# Patient Record
Sex: Male | Born: 1950
Health system: Southern US, Community
[De-identification: ages and names within clinical notes are randomized; demographics above are authoritative.]

## PROBLEM LIST (undated history)

## (undated) DIAGNOSIS — G4700 Insomnia, unspecified: Secondary | ICD-10-CM

## (undated) DIAGNOSIS — F419 Anxiety disorder, unspecified: Secondary | ICD-10-CM

## (undated) DIAGNOSIS — R945 Abnormal results of liver function studies: Secondary | ICD-10-CM

## (undated) DIAGNOSIS — R7989 Other specified abnormal findings of blood chemistry: Secondary | ICD-10-CM

## (undated) DIAGNOSIS — IMO0002 Reserved for concepts with insufficient information to code with codable children: Secondary | ICD-10-CM

## (undated) DIAGNOSIS — K219 Gastro-esophageal reflux disease without esophagitis: Secondary | ICD-10-CM

## (undated) DIAGNOSIS — I10 Essential (primary) hypertension: Secondary | ICD-10-CM

## (undated) DIAGNOSIS — N529 Male erectile dysfunction, unspecified: Secondary | ICD-10-CM

## (undated) DIAGNOSIS — K635 Polyp of colon: Secondary | ICD-10-CM

## (undated) DIAGNOSIS — E785 Hyperlipidemia, unspecified: Secondary | ICD-10-CM

## (undated) DIAGNOSIS — F17200 Nicotine dependence, unspecified, uncomplicated: Secondary | ICD-10-CM

## (undated) HISTORY — DX: Hyperlipidemia, unspecified: E78.5

## (undated) HISTORY — DX: Male erectile dysfunction, unspecified: N52.9

## (undated) HISTORY — DX: Anxiety disorder, unspecified: F41.9

## (undated) HISTORY — PX: APPENDECTOMY: SHX54

## (undated) HISTORY — DX: Polyp of colon: K63.5

## (undated) HISTORY — DX: Gastro-esophageal reflux disease without esophagitis: K21.9

## (undated) HISTORY — PX: ADENOIDECTOMY: SUR15

## (undated) HISTORY — DX: Nicotine dependence, unspecified, uncomplicated: F17.200

## (undated) HISTORY — PX: DENTAL SURGERY: SHX609

## (undated) HISTORY — DX: Essential (primary) hypertension: I10

## (undated) HISTORY — DX: Other specified abnormal findings of blood chemistry: R79.89

## (undated) HISTORY — PX: OTHER SURGICAL HISTORY: SHX169

## (undated) HISTORY — DX: Reserved for concepts with insufficient information to code with codable children: IMO0002

## (undated) HISTORY — DX: Insomnia, unspecified: G47.00

## (undated) HISTORY — DX: Abnormal results of liver function studies: R94.5

## (undated) HISTORY — PX: VASECTOMY: SHX75

---

## 1998-09-24 ENCOUNTER — Encounter: Payer: Self-pay | Admitting: Emergency Medicine

## 1998-09-24 ENCOUNTER — Emergency Department (HOSPITAL_COMMUNITY): Admission: EM | Admit: 1998-09-24 | Discharge: 1998-09-24 | Payer: Self-pay | Admitting: Emergency Medicine

## 2002-06-28 ENCOUNTER — Encounter: Payer: Self-pay | Admitting: Internal Medicine

## 2002-06-28 ENCOUNTER — Encounter: Admission: RE | Admit: 2002-06-28 | Discharge: 2002-06-28 | Payer: Self-pay | Admitting: Internal Medicine

## 2003-08-07 ENCOUNTER — Encounter: Admission: RE | Admit: 2003-08-07 | Discharge: 2003-08-07 | Payer: Self-pay | Admitting: Neurosurgery

## 2003-08-08 ENCOUNTER — Encounter: Admission: RE | Admit: 2003-08-08 | Discharge: 2003-08-08 | Payer: Self-pay | Admitting: Neurosurgery

## 2003-08-14 ENCOUNTER — Encounter: Admission: RE | Admit: 2003-08-14 | Discharge: 2003-11-12 | Payer: Self-pay | Admitting: Internal Medicine

## 2003-12-04 ENCOUNTER — Encounter: Admission: RE | Admit: 2003-12-04 | Discharge: 2003-12-04 | Payer: Self-pay | Admitting: Internal Medicine

## 2007-06-02 ENCOUNTER — Ambulatory Visit: Payer: Self-pay

## 2007-07-12 ENCOUNTER — Ambulatory Visit: Payer: Self-pay | Admitting: Gastroenterology

## 2007-07-23 ENCOUNTER — Encounter: Payer: Self-pay | Admitting: Gastroenterology

## 2007-07-23 ENCOUNTER — Ambulatory Visit: Payer: Self-pay | Admitting: Gastroenterology

## 2007-07-23 LAB — HM COLONOSCOPY

## 2010-05-29 ENCOUNTER — Encounter (INDEPENDENT_AMBULATORY_CARE_PROVIDER_SITE_OTHER): Payer: Self-pay | Admitting: *Deleted

## 2010-08-15 ENCOUNTER — Emergency Department (HOSPITAL_COMMUNITY): Admission: EM | Admit: 2010-08-15 | Discharge: 2010-08-15 | Payer: Self-pay | Admitting: Emergency Medicine

## 2010-08-18 ENCOUNTER — Emergency Department (HOSPITAL_COMMUNITY): Admission: EM | Admit: 2010-08-18 | Discharge: 2010-08-18 | Payer: Self-pay | Admitting: Family Medicine

## 2010-08-22 ENCOUNTER — Emergency Department (HOSPITAL_COMMUNITY): Admission: EM | Admit: 2010-08-22 | Discharge: 2010-08-22 | Payer: Self-pay | Admitting: Family Medicine

## 2010-08-29 ENCOUNTER — Emergency Department (HOSPITAL_COMMUNITY)
Admission: EM | Admit: 2010-08-29 | Discharge: 2010-08-29 | Payer: Self-pay | Source: Home / Self Care | Admitting: Family Medicine

## 2010-09-18 ENCOUNTER — Observation Stay (HOSPITAL_COMMUNITY)
Admission: EM | Admit: 2010-09-18 | Discharge: 2010-09-19 | Payer: Self-pay | Source: Home / Self Care | Attending: Internal Medicine | Admitting: Internal Medicine

## 2010-10-09 ENCOUNTER — Encounter (INDEPENDENT_AMBULATORY_CARE_PROVIDER_SITE_OTHER): Payer: Self-pay | Admitting: *Deleted

## 2010-10-10 ENCOUNTER — Ambulatory Visit: Admit: 2010-10-10 | Payer: Self-pay | Admitting: Cardiology

## 2010-10-16 ENCOUNTER — Encounter: Payer: Self-pay | Admitting: Gastroenterology

## 2010-10-25 NOTE — H&P (Signed)
NAMEROD, MAJERUS               ACCOUNT NO.:  [id number]  MEDICAL RECORD NO.:  [id number]           PATIENT TYPE:  LOCATION:                                 FACILITY:  PHYSICIAN:  Massie Maroon, MD        DATE OF BIRTH:  01/07/1951  DATE OF ADMISSION: DATE OF DISCHARGE:                             HISTORY & PHYSICAL   CHIEF COMPLAINT:  Epigastric pain.  HISTORY OF PRESENT ILLNESS:  The patient is a 60 year old male with a history of hypertension, hyperlipidemia, complains of a sharp epigastric pain, burning like, indigestion.  He apparently took some Tums and omeprazole and this did not seem to take the pain away and so he presented to the emergency room.  The patient notes that the pain started probably about an hour and half to two hours prior to arrival. His last bowel movement was earlier yesterday and was normal.  The patient himself denies any nausea, vomiting, diarrhea, constipation, bright red blood per rectum or black stool.  EKG showed normal sinus rhythm at 43, left axis deviation, normal PR interval, normal QTc, Q- waves in V1, V2.  Chest x-ray was negative for any acute process.  A CT angio was performed of the chest and did not show any evidence of pulmonary embolus.  It did show bibasilar atelectasis, incidental note of direct origin of the left vertebral artery from the aortic arch, no aortic dissection, mild calcific atherosclerotic disease, nonspecific perinephric stranding bilaterally, stones within the gallbladder, the gallbladder is otherwise unremarkable in appearance, diverticulosis without definitive diverticulitis, and fecalization of the distal small bowel raises concern for small-bowel dysmotility.  The patient will be admitted for workup of epigastric discomfort.  PAST MEDICAL HISTORY: 1. Hypertension. 2. Hyperlipidemia. 3. History of abnormal liver function tests with prior ultrasound,     December 04, 2003, which showed multiple gallstones within  the     gallbladder, slight thickening of the gallbladder wall.  PAST SURGICAL HISTORY: 1. Uvulectomy. 2. Appendectomy. 3. Vasectomy.  SOCIAL HISTORY:  The patient is a current smoker, smokes 1-pack per day for 20 years.  He occasionally drinks.  He is married and has 2 children.  He is retired from [employer].  FAMILY HISTORY:  Mother is alive at age 12 and has a history of recurrent falls and thyroid problems.  Father is alive at age 30 and status post pacemaker at age 23.  He has a history of hypertension, hyperlipidemia, prostate cancer.  One sister is alive at age 36 and healthy.  ALLERGIES:  No known drug allergies.  MEDICATIONS: 1. Enteric-coated aspirin 81 mg p.o. daily. 2. Ambien 10 mg p.o. q.h.s. p.r.n. 3. Diovan 160 mg one-half p.o. daily. 4. Lipitor 20 mg p.o. q.h.s. 5. Vita-Zinc. 6. Vitamin D.  REVIEW OF SYSTEMS:  Negative for all 10 organ systems except for pertinent positives stated above.  PHYSICAL EXAMINATION:  VITAL SIGNS:  Temperature 97.4, pulse 62, blood pressure 125/71, pulse ox is 96% on room air. HEENT:  Anicteric, EOMI, no nystagmus, pupils 1.5 mm, symmetric. Direct, consensual, and near reflexes intact.  Mucous membranes moist. NECK:  No JVD, no bruit. HEART:  Regular rate and rhythm.  S1 and S2.  No murmurs, gallops, or rubs. LUNGS:  Clear to auscultation bilaterally. ABDOMEN:  Soft, nontender, nondistended.  Positive bowel sounds. EXTREMITIES:  No cyanosis, clubbing, or edema. SKIN:  No rashes. LYMPH NODES:  No adenopathy. NEURO:  Nonfocal, cranial nerves II through XII intact.  Reflexes 2+, symmetric, diffuse with downgoing toes bilaterally.  Motor strength 5/5 in all 4 extremities, pinprick intact.  LABORATORY DATA:  Lipase 27, AST 29, ALT 24, alk phos 67, total bilirubin 0.7, troponin I less than 0.05.  WBC 9.3, hemoglobin 13.9, platelet count 244.  BUN 22, creatinine 1.1.  CT angio chest, no evidence of aortic dissection or  pulmonary embolism, incidental note of direct origin of the left vertebral artery from the aortic arch, stones noted within the gallbladder, otherwise unremarkable, fecalization of the distal small bowel raises concern for small-bowel dysmotility, diverticulosis along the descending and proximal sigmoid colon without evidence of diverticulitis.  ASSESSMENT/PLAN: 1. Epigastric pain likely secondary to gastroesophageal reflux     disease:  We will, however, place the patient on telemetry as this     could an atypical presentation of heart disease and check CK, CK-     MB, troponin I q.6 h. x3 sets.  The patient does have an abnormal     EKG with Q-waves in V1, V2 and probably needs a stress test either     inpatient or outpatient.  We will try to treat his abdominal pain     with Protonix 40 mg p.o. b.i.d.  The patient will continue aspirin,     blood pressure medications, and will not add a beta-blocker at this     time due to bradycardia.  The patient will be continued on statin. 2. Bradycardia:  The patient had episode of bradycardia down to 44,     but was asymptomatic.  We will check a TSH. 3. Hypertension:  Continue Diovan. 4. Hyperlipidemia: We will use Crestor 40 mg p.o. q.h.s. 5. Deep vein thrombosis prophylaxis:  Lovenox. 6. Tobacco dependence:  The patient was counseled not to smoke for 10     minutes.     Massie Maroon, MD     JYK/MEDQ  D:  09/18/2010  T:  09/18/2010  Job:  [id number]  cc:   Lucky Cowboy, M.D. Fax: [phone]  Electronically Signed by Pearson Grippe MD on 10/25/2010 08:02:21 PM

## 2010-10-29 NOTE — Letter (Signed)
Summary: Colonoscopy Letter  [facility]  [address] [place], [place] [address]   Phone: [phone]  Fax: [phone]      May 29, 2010 MRN: [id number]   [facility] [address] [place], [place]  [address]   Dear Mr. Kratt,   According to your medical record, it is time for you to schedule a Colonoscopy. The American Cancer Society recommends this procedure as a method to detect early colon cancer. Patients with a family history of colon cancer, or a personal history of colon polyps or inflammatory bowel disease are at increased risk.  This letter has beeen generated based on the recommendations made at the time of your procedure. If you feel that in your particular situation this may no longer apply, please contact our office.  Please call our office at [phone] to schedule this appointment or to update your records at your earliest convenience.  Thank you for cooperating with [place] to provide you with the very best care possible.   Sincerely,  Judie Petit T. Russella Dar, M.D.  [facility]astro[facility] [phone]

## 2010-10-30 DIAGNOSIS — E782 Mixed hyperlipidemia: Secondary | ICD-10-CM | POA: Insufficient documentation

## 2010-10-31 ENCOUNTER — Ambulatory Visit (INDEPENDENT_AMBULATORY_CARE_PROVIDER_SITE_OTHER): Payer: BC Managed Care – PPO | Admitting: Cardiology

## 2010-10-31 ENCOUNTER — Encounter: Payer: Self-pay | Admitting: Cardiology

## 2010-10-31 ENCOUNTER — Ambulatory Visit: Admit: 2010-10-31 | Payer: Self-pay | Admitting: Cardiology

## 2010-10-31 DIAGNOSIS — R072 Precordial pain: Secondary | ICD-10-CM

## 2010-10-31 DIAGNOSIS — Z87891 Personal history of nicotine dependence: Secondary | ICD-10-CM | POA: Insufficient documentation

## 2010-10-31 DIAGNOSIS — F17200 Nicotine dependence, unspecified, uncomplicated: Secondary | ICD-10-CM | POA: Insufficient documentation

## 2010-10-31 NOTE — Letter (Signed)
Summary: Appointment - Reschedule  [facility], Main Office  [address]   [place], [place] [address]   Phone: [phone]  Fax: [phone]     October 09, 2010 MRN: [id number]   [facility] [address] [place], [place]  [address]   Dear Mr. Raffel,   Due to a change in our office schedule, your appointment o2-10-2010 on                     at 11:45 a.m.   must be changed.  It is very important that we reach you to reschedule this appointment. We look forward to participating in your health care needs. Please contact us at the number listed above at your earliest convenience to reschedule this appointment.     Sincerely,     Lorne Skeens  [facility] Scheduling Team

## 2010-11-06 NOTE — Letter (Signed)
Summary: Karie Soda MD/[facility]  Karie Soda MD/[facility]   Imported By: Lester Freeburn 11/01/2010 10:23:08  _____________________________________________________________________  External Attachment:    Type:   Image     Comment:   External Document

## 2010-11-06 NOTE — Assessment & Plan Note (Signed)
Summary: np6/chest pain, sob.mb/per rebecca [phone] pt has bcbs.mb/12...   Vital Signs:  Patient profile:   60 year old male Height:      74 inches Weight:      213 pounds BMI:     27.45 Pulse rate:   82 / minute Resp:     16 per minute BP sitting:   120 / 86  (right arm)  Vitals Entered By: Marrion Coy, CNA (October 31, 2010 11:37 AM)  Visit Type:  Initial Consult Primary Provider:  Dr. Marlowe Shores  CC:  chest pain.  History of Present Illness: The patient presents for evaluation of chest discomfort. He has no prior cardiac history but was admitted to the hospital last month with chest pain. He had an extensive workup which included a CT to rule out pulmonary emboli. Lipase was normal. Enzymes and EKG were normal. He subsequently was treated with Prilosec and has had no recurrence of this discomfort. The discomfort was at rest. It was upper epigastric. It was similar to previous reflux. He did not describe associated symptoms or radiation. Since discharge from the hospital he has had no recurrent discomfort. He denies any chest pressure, neck or arm discomfort. He has no palpitations, presyncope or syncope. He is active working on a farm. He has no limitations with this.  Current Medications (verified): 1)  Lipitor 20 Mg Tabs (Atorvastatin Calcium) .Marland Kitchen.. 1 By Mouth Daily 2)  Diovan 160 Mg Tabs (Valsartan) .Marland Kitchen.. 1 By Mouth Daily 3)  Vitamin D3 5000 Unit/ml Liqd (Cholecalciferol) .Marland Kitchen.. 1 By Mouth Daily 4)  Ambien 10 Mg Tabs (Zolpidem Tartrate) .... At Bedtime 5)  Protonix 40 Mg Tbec (Pantoprazole Sodium) .Marland Kitchen.. 1 By Mouth Two Times A Day  Allergies (verified): No Known Drug Allergies  Past History:  Past Medical History: Last updated: 10/30/2010  1. Hypertension.   2. Hyperlipidemia.   3. History of abnormal liver function tests with prior ultrasound,       December 04, 2003, which showed multiple gallstones within the       gallbladder, slight thickening of the gallbladder wall.        Past Surgical History: Reviewed history from 10/30/2010 and no changes required.  1. Uvulectomy.   2. Appendectomy.   3. Vasectomy.   Family History: Mother is alive at age 60 and has a history of  recurrent falls and thyroid problems.  Father is alive at age 57 and  status post pacemaker at age 47.  He has a history of hypertension hyperlipidemia, prostate cancer.  One sister is alive at age 56 and healthy.   Social History: The patient is a current smoker, smokes 1-pack per day for 20 years.  He occasionally drinks.  He is married and has 2  children.  He is retired from [employer].   Review of Systems       As stated in the HPI and negative for all other systems.   Physical Exam  General:  Well developed, well nourished, in no acute distress. Head:  normocephalic and atraumatic Eyes:  PERRLA/EOM intact; conjunctiva and lids normal. Mouth:  Teeth, gums and palate normal. Oral mucosa normal. Neck:  Neck supple, no JVD. No masses, thyromegaly or abnormal cervical nodes. Chest Wall:  no deformities or breast masses noted Lungs:  Clear bilaterally to auscultation and percussion. Abdomen:  Bowel sounds positive; abdomen soft and non-tender without masses, organomegaly, or hernias noted. No hepatosplenomegaly. Msk:  Back normal, normal gait. Muscle strength and tone  normal. Extremities:  No clubbing or cyanosis. Neurologic:  Alert and oriented x 3. Skin:  Intact without lesions or rashes. Cervical Nodes:  no significant adenopathy Axillary Nodes:  no significant adenopathy Inguinal Nodes:  no significant adenopathy Psych:  Normal affect.   Detailed Cardiovascular Exam  Neck    Carotids: Carotids full and equal bilaterally without bruits.      Neck Veins: Normal, no JVD.    Heart    Inspection: no deformities or lifts noted.      Palpation: normal PMI with no thrills palpable.      Auscultation: regular rate and rhythm, S1, S2 without murmurs, rubs, gallops, or  clicks.    Vascular    Abdominal Aorta: no palpable masses, pulsations, or audible bruits.      Femoral Pulses: normal femoral pulses bilaterally.      Pedal Pulses: normal pedal pulses bilaterally.      Radial Pulses: normal radial pulses bilaterally.      Peripheral Circulation: no clubbing, cyanosis, or edema noted with normal capillary refill.     Impression & Recommendations:  Problem # 1:  CHEST PAIN, PRECORDIAL (ICD-786.51) Pretest probability of obstructive coronary disease is low. However, he has significant risk factors. Exercise treadmill testing is indicated to rule out obstructive coronary disease, risk stratify and prescribe exercise. Orders: EKG w/ Interpretation (93000) Treadmill (Treadmill)  Problem # 2:  HYPERLIPIDEMIA (ICD-272.4)  He reports that this is well controlled and followed by his primary provider. His updated medication list for this problem includes:    Lipitor 20 Mg Tabs (Atorvastatin calcium) .Marland Kitchen... 1 by mouth daily  His updated medication list for this problem includes:    Lipitor 20 Mg Tabs (Atorvastatin calcium) .Marland Kitchen... 1 by mouth daily  Problem # 3:  TOBACCO ABUSE (ICD-305.1) We discussed this with greater than 3 minutes. He agrees to try Chantix.  Patient Instructions: 1)  Your physician recommends that you schedule a follow-up appointment at the time of your treadmill 2)  Your physician recommends that you continue on your current medications as directed. Please refer to the Current Medication list given to you today.  Start Chantix as directed 3)  Your physician has requested that you have an exercise tolerance test.  For further information please visit [email or web address]  Please also follow instruction sheet, as given. Prescriptions: CHANTIX 1 MG TABS (VARENICLINE TARTRATE) one twice a day  #60 x 3   Entered by:   Charolotte Capuchin, RN   Authorized by:   Rollene Rotunda, MD, [facility]   Signed by:   Charolotte Capuchin, RN on 10/31/2010    Method used:   Electronically to        [facility] (retail)       [address]       [place], [place]  [address]       Ph: [phone] or [phone]       Fax: [phone]   RxID:   [id number] CHANTIX STARTING MONTH PAK 0.5 MG X 11 & 1 MG X 42 TABS (VARENICLINE TARTRATE) as directed  #1 x 0   Entered by:   Charolotte Capuchin, RN   Authorized by:   Rollene Rotunda, MD, [facility]   Signed by:   Charolotte Capuchin, RN on 10/31/2010   Method used:   Electronically to        [facility] (retail)       [address]       [place], [place]  [address]  Ph: [phone] or [phone]       Fax: [phone]   RxID:   [id number]    Orders Added: 1)  EKG w/ Interpretation [93000] 2)  Treadmill [Treadmill]   EKG, NSR. Leftward axis, otherwise unremarkable.

## 2010-12-02 ENCOUNTER — Encounter: Payer: BC Managed Care – PPO | Admitting: Cardiology

## 2010-12-09 LAB — COMPREHENSIVE METABOLIC PANEL
ALT: 24 U/L (ref 0–53)
AST: 29 U/L (ref 0–37)
Albumin: 3.2 g/dL — ABNORMAL LOW (ref 3.5–5.2)
Alkaline Phosphatase: 58 U/L (ref 39–117)
BUN: 14 mg/dL (ref 6–23)
CO2: 25 mEq/L (ref 19–32)
Calcium: 9.2 mg/dL (ref 8.4–10.5)
Chloride: 106 mEq/L (ref 96–112)
Creatinine, Ser: 0.96 mg/dL (ref 0.4–1.5)
GFR calc Af Amer: >60 mL/min (ref >60)
GFR calc non Af Amer: >60 mL/min (ref >60)
Glucose, Bld: 123 mg/dL — ABNORMAL HIGH (ref 70–99)
Potassium: 4.3 mEq/L (ref 3.5–5.1)
Sodium: 139 mEq/L (ref 135–145)
Total Bilirubin: 0.6 mg/dL (ref 0.3–1.2)
Total Protein: 5.9 g/dL — ABNORMAL LOW (ref 6.0–8.3)

## 2010-12-09 LAB — HEPATIC FUNCTION PANEL
ALT: 24 U/L (ref 0–53)
AST: 29 U/L (ref 0–37)
Albumin: 3.5 g/dL (ref 3.5–5.2)
Alkaline Phosphatase: 67 U/L (ref 39–117)
Bilirubin, Direct: 0.1 mg/dL (ref 0.0–0.3)
Indirect Bilirubin: 0.6 mg/dL (ref 0.3–0.9)
Total Bilirubin: 0.7 mg/dL (ref 0.3–1.2)
Total Protein: 6.5 g/dL (ref 6.0–8.3)

## 2010-12-09 LAB — RAPID URINE DRUG SCREEN, HOSP PERFORMED
Amphetamines: NOT DETECTED
Barbiturates: NOT DETECTED
Benzodiazepines: NOT DETECTED
Cocaine: NOT DETECTED
Opiates: NOT DETECTED
Tetrahydrocannabinol: NOT DETECTED

## 2010-12-09 LAB — POCT CARDIAC MARKERS
CKMB, poc: 1.1 ng/mL (ref 1.0–8.0)
Myoglobin, poc: 117 ng/mL (ref 12–200)
Troponin i, poc: <0.05 ng/mL (ref 0.00–0.09)

## 2010-12-09 LAB — POCT I-STAT, CHEM 8
BUN: 22 mg/dL (ref 6–23)
Calcium, Ion: 1.14 mmol/L (ref 1.12–1.32)
Chloride: 104 mEq/L (ref 96–112)
Creatinine, Ser: 1.1 mg/dL (ref 0.4–1.5)
Glucose, Bld: 152 mg/dL — ABNORMAL HIGH (ref 70–99)
HCT: 40 % (ref 39.0–52.0)
Hemoglobin: 13.6 g/dL (ref 13.0–17.0)
Potassium: 4 mEq/L (ref 3.5–5.1)
Sodium: 140 mEq/L (ref 135–145)
TCO2: 29 mmol/L (ref 0–100)

## 2010-12-09 LAB — CARDIAC PANEL(CRET KIN+CKTOT+MB+TROPI)
CK, MB: 1.6 ng/mL (ref 0.3–4.0)
CK, MB: 1.8 ng/mL (ref 0.3–4.0)
CK, MB: 2 ng/mL (ref 0.3–4.0)
Relative Index: 0.2 (ref 0.0–2.5)
Relative Index: 0.4 (ref 0.0–2.5)
Relative Index: 0.5 (ref 0.0–2.5)
Total CK: 419 U/L — ABNORMAL HIGH (ref 7–232)
Total CK: 472 U/L — ABNORMAL HIGH (ref 7–232)
Total CK: 652 U/L — ABNORMAL HIGH (ref 7–232)
Troponin I: 0.01 ng/mL (ref 0.00–0.06)
Troponin I: 0.01 ng/mL (ref 0.00–0.06)
Troponin I: 0.01 ng/mL (ref 0.00–0.06)

## 2010-12-09 LAB — CBC
HCT: 36.5 % — ABNORMAL LOW (ref 39.0–52.0)
HCT: 40.3 % (ref 39.0–52.0)
Hemoglobin: 12.4 g/dL — ABNORMAL LOW (ref 13.0–17.0)
Hemoglobin: 13.9 g/dL (ref 13.0–17.0)
MCH: 30.2 pg (ref 26.0–34.0)
MCH: 30.5 pg (ref 26.0–34.0)
MCHC: 34 g/dL (ref 30.0–36.0)
MCHC: 34.5 g/dL (ref 30.0–36.0)
MCV: 88.4 fL (ref 78.0–100.0)
MCV: 89 fL (ref 78.0–100.0)
Platelets: 172 10*3/uL (ref 150–400)
Platelets: 244 10*3/uL (ref 150–400)
RBC: 4.1 MIL/uL — ABNORMAL LOW (ref 4.22–5.81)
RBC: 4.56 MIL/uL (ref 4.22–5.81)
RDW: 12.8 % (ref 11.5–15.5)
RDW: 12.9 % (ref 11.5–15.5)
WBC: 7.4 10*3/uL (ref 4.0–10.5)
WBC: 9.3 10*3/uL (ref 4.0–10.5)

## 2010-12-09 LAB — DIFFERENTIAL
Basophils Absolute: 0.1 10*3/uL (ref 0.0–0.1)
Basophils Relative: 1 % (ref 0–1)
Eosinophils Absolute: 0.6 10*3/uL (ref 0.0–0.7)
Eosinophils Relative: 6 % — ABNORMAL HIGH (ref 0–5)
Lymphocytes Relative: 37 % (ref 12–46)
Lymphs Abs: 3.4 10*3/uL (ref 0.7–4.0)
Monocytes Absolute: 0.7 10*3/uL (ref 0.1–1.0)
Monocytes Relative: 8 % (ref 3–12)
Neutro Abs: 4.4 10*3/uL (ref 1.7–7.7)
Neutrophils Relative %: 48 % (ref 43–77)

## 2010-12-09 LAB — TSH: TSH: 0.901 u[IU]/mL (ref 0.350–4.500)

## 2010-12-09 LAB — LIPASE, BLOOD: Lipase: 27 U/L (ref 11–59)

## 2010-12-17 ENCOUNTER — Encounter: Payer: Self-pay | Admitting: Internal Medicine

## 2010-12-17 ENCOUNTER — Encounter: Payer: Self-pay | Admitting: Cardiology

## 2010-12-30 ENCOUNTER — Encounter (INDEPENDENT_AMBULATORY_CARE_PROVIDER_SITE_OTHER): Payer: BC Managed Care – PPO | Admitting: Cardiology

## 2010-12-30 DIAGNOSIS — R072 Precordial pain: Secondary | ICD-10-CM

## 2010-12-30 NOTE — Progress Notes (Signed)
Exercise Treadmill Test  Pre-Exercise Testing Evaluation QT:  .38 QTc: .44   ST Segments:  no significant ST changes at rest     Test  Exercise Tolerance Test Ordering MD: Angelina Sheriff, MD  Interpreting MD:  Angelina Sheriff, MD  Unique Test No:1  Treadmill:  1  Indication for ETT: known ASHD  Contraindication to ETT: No   Stress Modality: exercise - treadmill  Cardiac Imaging Performed: non   Protocol: standard Bruce - maximal  Max BP:  189/84  Max MPHR (bpm):  160 85% MPR (bpm):  136  MPHR obtained (bpm):  141 % MPHR obtained:  89  Reached 85% MPHR (min:sec):  7:00 Total Exercise Time (min-sec):  9:00  Workload in METS:  10.1 Borg Scale: 15  Reason ETT Terminated:  desired heart rate attained    ST Segment Analysis At Rest: normal ST segments - no evidence of significant ST depression With Exercise: no evidence of significant ST depression  Other Information Arrhythmia:  No Angina during ETT:  absent (0) Quality of ETT:  diagnostic  ETT Interpretation:  normal - no evidence of ischemia by ST analysis  Comments: The patient had an good exercise tolerance.  There was no chest pain.  There was an appropriate level of dyspnea.  There were no arrhythmias, a normal heart rate response and normal BP response.  There were no ischemic ST T wave changes and a normal heart rate recovery.  Recommendations: Negative adequate ETT.  No further testing is indicated.  Based on the above I gave the patient a prescription for exercise.

## 2010-12-30 NOTE — Progress Notes (Deleted)
Exercise Treadmill Test  Pre-Exercise Testing Evaluation Rhythm: {CHL RHYTHM BASELINE EKG FOR BMW:41324401}  Rate: {CHL RATE BASELINE EKG FOR ETT:21021048}   PR:  {CHL PR BASELINE EKG FOR ETT:21021049} QRS:  {CHL QRS BASELINE EKG FOR ETT:21021050}  QT:  {CHL QT BASELINE EKG FOR ETT:21021051} QTc: {CHL QTC BASELINE EKG FOR ETT:21021052}   P axis: {CHL AXIS BASELINE EKG FOR ETT:21021053}  QRS axis:  {CHL AXIS BASELINE EKG FOR ETT:21021053}  ST Segments:  {CHL ST SEGMENTS BASELINE EKG FOR UUV:25366440}     Test  Exercise Tolerance Test Ordering MD: {CHL LB CARDIOLOGY MD FOR HKV:42595638}  Interpreting MD:  {CHL LB CARDIOLOGY MD FOR VFI:43329518}  Unique Test No: ***  Treadmill:  {CHL TREADMILL # FOR ACZ:66063016}  Indication for ETT: {CHL INDICATION FOR WFU:93235573}  Contraindication to ETT: {CHL CONTRAINDICATION TO UKG:25427062}   Stress Modality: {CHL STRESS MODALITY FOR BJS:28315176}  Cardiac Imaging Performed: {CHL CARDIAC IMAGING PERFORMED FOR HYW:73710626}   Protocol: {CHL PROTOCOL FOR ETT:21021061}  Max BP:  ***/***  Max MPHR (bpm):  *** 85% MPR (bpm):  ***  MPHR obtained (bpm):  *** % MPHR obtained:  ***  Reached 85% MPHR (min:sec):  *** Total Exercise Time (min-sec):  ***  Workload in METS:  *** Borg Scale: ***  Reason ETT Terminated:  {CHL REASON TERMINATED FOR ETT:21021064}    ST Segment Analysis At Rest: {CHL ST SEGMENT AT REST FOR RSW:54627035} With Exercise: {CHL ST SEGMENT WITH EXERCISE FOR KKX:38182993}  Other Information Arrhythmia:  {CHL ARRHYTHMIA FOR ZJI:96789381} Angina during ETT:  {CHL ANGINA DURING OFB:51025852} Quality of ETT:  {CHL QUALITY OF DPO:24235361}  ETT Interpretation:  {CHL INTERPRETATION FOR WER:15400867}  Comments: ***  Recommendations: ***   This encounter was created in error - please disregard.

## 2011-02-08 ENCOUNTER — Emergency Department (HOSPITAL_COMMUNITY)
Admission: EM | Admit: 2011-02-08 | Discharge: 2011-02-08 | Disposition: A | Discharge status: Home / Self Care | Payer: BC Managed Care – PPO | Attending: Emergency Medicine | Admitting: Emergency Medicine

## 2011-02-08 DIAGNOSIS — E7800 Pure hypercholesterolemia, unspecified: Secondary | ICD-10-CM | POA: Insufficient documentation

## 2011-02-08 DIAGNOSIS — R1013 Epigastric pain: Secondary | ICD-10-CM | POA: Insufficient documentation

## 2011-02-08 DIAGNOSIS — R61 Generalized hyperhidrosis: Secondary | ICD-10-CM | POA: Insufficient documentation

## 2011-02-08 DIAGNOSIS — K219 Gastro-esophageal reflux disease without esophagitis: Secondary | ICD-10-CM | POA: Insufficient documentation

## 2011-02-08 DIAGNOSIS — I10 Essential (primary) hypertension: Secondary | ICD-10-CM | POA: Insufficient documentation

## 2011-02-08 LAB — POCT I-STAT, CHEM 8
BUN: 19 mg/dL (ref 6–23)
Calcium, Ion: 1.1 mmol/L — ABNORMAL LOW (ref 1.12–1.32)
Chloride: 105 mEq/L (ref 96–112)
Creatinine, Ser: 1.4 mg/dL (ref 0.4–1.5)
Glucose, Bld: 218 mg/dL — ABNORMAL HIGH (ref 70–99)
HCT: 43 % (ref 39.0–52.0)
Hemoglobin: 14.6 g/dL (ref 13.0–17.0)
Potassium: 3.1 mEq/L — ABNORMAL LOW (ref 3.5–5.1)
Sodium: 141 mEq/L (ref 135–145)
TCO2: 23 mmol/L (ref 0–100)

## 2011-02-08 LAB — CBC
HCT: 41 % (ref 39.0–52.0)
Hemoglobin: 14.5 g/dL (ref 13.0–17.0)
MCH: 31.2 pg (ref 26.0–34.0)
MCHC: 35.4 g/dL (ref 30.0–36.0)
MCV: 88.2 fL (ref 78.0–100.0)
Platelets: 179 10*3/uL (ref 150–400)
RBC: 4.65 MIL/uL (ref 4.22–5.81)
RDW: 12.9 % (ref 11.5–15.5)
WBC: 6.4 10*3/uL (ref 4.0–10.5)

## 2011-02-08 LAB — LIPASE, BLOOD: Lipase: 19 U/L (ref 11–59)

## 2011-02-08 LAB — POCT CARDIAC MARKERS
CKMB, poc: <1 ng/mL — ABNORMAL LOW (ref 1.0–8.0)
Myoglobin, poc: 94.5 ng/mL (ref 12–200)
Troponin i, poc: <0.05 ng/mL (ref 0.00–0.09)

## 2011-02-08 LAB — DIFFERENTIAL
Basophils Absolute: 0 10*3/uL (ref 0.0–0.1)
Basophils Relative: 1 % (ref 0–1)
Eosinophils Absolute: 0.2 10*3/uL (ref 0.0–0.7)
Eosinophils Relative: 4 % (ref 0–5)
Lymphocytes Relative: 21 % (ref 12–46)
Lymphs Abs: 1.4 10*3/uL (ref 0.7–4.0)
Monocytes Absolute: 0.6 10*3/uL (ref 0.1–1.0)
Monocytes Relative: 9 % (ref 3–12)
Neutro Abs: 4.2 10*3/uL (ref 1.7–7.7)
Neutrophils Relative %: 65 % (ref 43–77)

## 2011-02-26 ENCOUNTER — Encounter: Payer: Self-pay | Admitting: Cardiology

## 2011-11-15 IMAGING — CR DG CHEST 1V PORT
1 series · 1 of 1 positions shown · non-contrast
Comparison: None

CLINICAL DATA: Upper abdominal pain, shortness of breath.

PORTABLE CHEST - 1 VIEW

[AP]
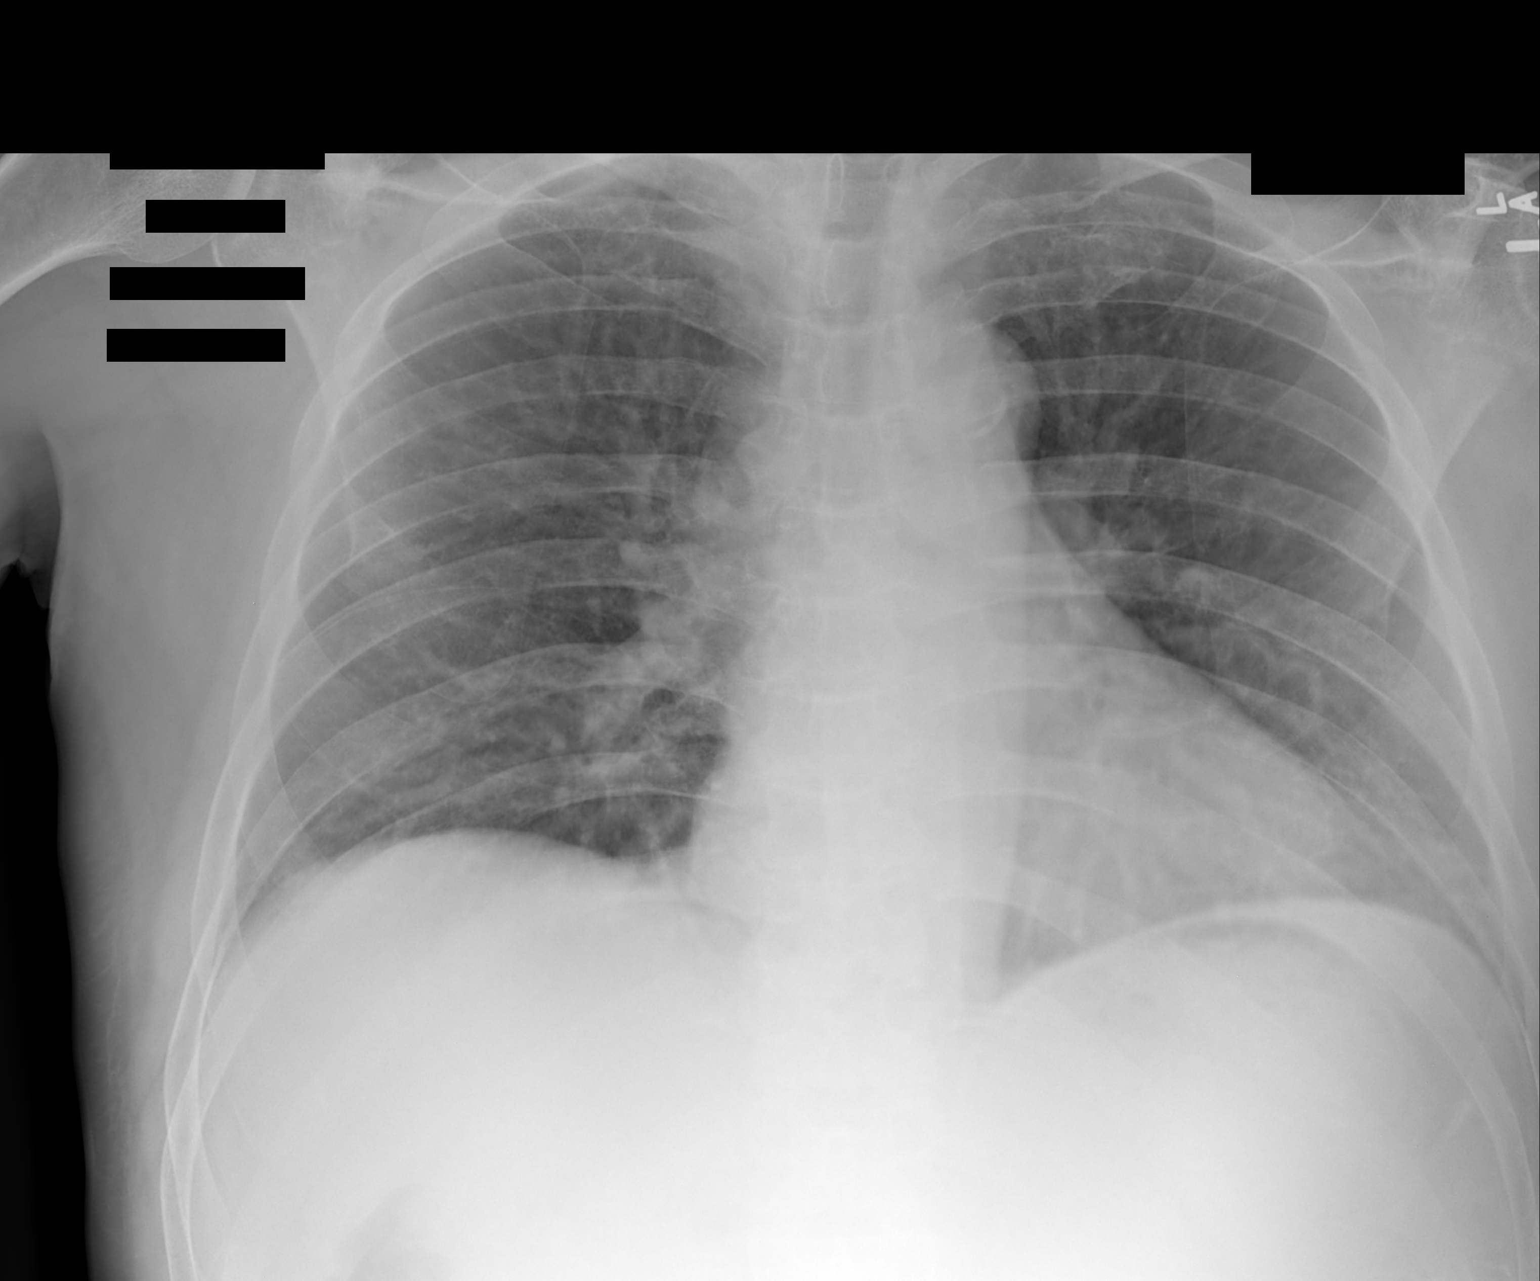

[1 of 1 positions shown; findings below may reference images not displayed]

FINDINGS: There is mild peribronchial thickening and increased
markings.  Heart is normal size.  No confluent opacities or
effusions.  No acute bony abnormality.
IMPRESSION: Mild bronchitic changes.

## 2012-06-17 ENCOUNTER — Encounter: Payer: Self-pay | Admitting: Gastroenterology

## 2012-11-24 ENCOUNTER — Other Ambulatory Visit (HOSPITAL_COMMUNITY): Payer: Self-pay | Admitting: Internal Medicine

## 2012-11-24 ENCOUNTER — Ambulatory Visit (HOSPITAL_COMMUNITY)
Admission: RE | Admit: 2012-11-24 | Discharge: 2012-11-24 | Disposition: A | Discharge status: Home / Self Care | Payer: BC Managed Care – PPO | Source: Ambulatory Visit | Attending: Internal Medicine | Admitting: Internal Medicine

## 2012-11-24 DIAGNOSIS — I2510 Atherosclerotic heart disease of native coronary artery without angina pectoris: Secondary | ICD-10-CM | POA: Insufficient documentation

## 2012-11-24 DIAGNOSIS — M545 Low back pain, unspecified: Secondary | ICD-10-CM

## 2012-11-24 DIAGNOSIS — M542 Cervicalgia: Secondary | ICD-10-CM

## 2012-11-24 DIAGNOSIS — M79609 Pain in unspecified limb: Secondary | ICD-10-CM | POA: Insufficient documentation

## 2012-11-24 DIAGNOSIS — M25519 Pain in unspecified shoulder: Secondary | ICD-10-CM | POA: Insufficient documentation

## 2012-11-25 ENCOUNTER — Other Ambulatory Visit: Payer: Self-pay | Admitting: Internal Medicine

## 2012-11-25 DIAGNOSIS — R9389 Abnormal findings on diagnostic imaging of other specified body structures: Secondary | ICD-10-CM

## 2012-11-29 ENCOUNTER — Ambulatory Visit
Admission: RE | Admit: 2012-11-29 | Discharge: 2012-11-29 | Disposition: A | Discharge status: Home / Self Care | Payer: BC Managed Care – PPO | Source: Ambulatory Visit | Attending: Internal Medicine | Admitting: Internal Medicine

## 2012-11-29 DIAGNOSIS — R9389 Abnormal findings on diagnostic imaging of other specified body structures: Secondary | ICD-10-CM

## 2012-12-01 ENCOUNTER — Other Ambulatory Visit: Payer: Self-pay | Admitting: Dermatology

## 2013-01-07 ENCOUNTER — Other Ambulatory Visit: Payer: Self-pay | Admitting: Orthopedic Surgery

## 2013-01-07 DIAGNOSIS — R200 Anesthesia of skin: Secondary | ICD-10-CM

## 2013-01-07 DIAGNOSIS — M542 Cervicalgia: Secondary | ICD-10-CM

## 2013-01-11 ENCOUNTER — Ambulatory Visit
Admission: RE | Admit: 2013-01-11 | Discharge: 2013-01-11 | Disposition: A | Discharge status: Home / Self Care | Payer: BC Managed Care – PPO | Source: Ambulatory Visit | Attending: Orthopedic Surgery | Admitting: Orthopedic Surgery

## 2013-01-11 DIAGNOSIS — M542 Cervicalgia: Secondary | ICD-10-CM

## 2013-01-11 DIAGNOSIS — R200 Anesthesia of skin: Secondary | ICD-10-CM

## 2013-10-04 ENCOUNTER — Encounter: Payer: Self-pay | Admitting: Internal Medicine

## 2013-10-06 ENCOUNTER — Encounter: Payer: Self-pay | Admitting: Emergency Medicine

## 2013-10-06 ENCOUNTER — Ambulatory Visit (INDEPENDENT_AMBULATORY_CARE_PROVIDER_SITE_OTHER): Payer: BC Managed Care – PPO | Admitting: Emergency Medicine

## 2013-10-06 VITALS — BP 122/80 | HR 68 | Temp 98.2°F | Resp 18 | Ht 73.75 in | Wt 210.0 lb

## 2013-10-06 DIAGNOSIS — R059 Cough, unspecified: Secondary | ICD-10-CM

## 2013-10-06 DIAGNOSIS — I10 Essential (primary) hypertension: Secondary | ICD-10-CM

## 2013-10-06 DIAGNOSIS — Z125 Encounter for screening for malignant neoplasm of prostate: Secondary | ICD-10-CM

## 2013-10-06 DIAGNOSIS — R05 Cough: Secondary | ICD-10-CM

## 2013-10-06 DIAGNOSIS — E782 Mixed hyperlipidemia: Secondary | ICD-10-CM

## 2013-10-06 DIAGNOSIS — Z79899 Other long term (current) drug therapy: Secondary | ICD-10-CM

## 2013-10-06 DIAGNOSIS — E559 Vitamin D deficiency, unspecified: Secondary | ICD-10-CM

## 2013-10-06 DIAGNOSIS — F17200 Nicotine dependence, unspecified, uncomplicated: Secondary | ICD-10-CM

## 2013-10-06 DIAGNOSIS — Z0000 Encounter for general adult medical examination without abnormal findings: Secondary | ICD-10-CM

## 2013-10-06 DIAGNOSIS — R7309 Other abnormal glucose: Secondary | ICD-10-CM

## 2013-10-06 LAB — CBC WITH DIFFERENTIAL/PLATELET
Basophils Absolute: 0.1 10*3/uL (ref 0.0–0.1)
Basophils Relative: 1 % (ref 0–1)
Eosinophils Absolute: 0.3 10*3/uL (ref 0.0–0.7)
Eosinophils Relative: 6 % — ABNORMAL HIGH (ref 0–5)
HCT: 44.6 % (ref 39.0–52.0)
Hemoglobin: 15.9 g/dL (ref 13.0–17.0)
Lymphocytes Relative: 22 % (ref 12–46)
Lymphs Abs: 1.2 10*3/uL (ref 0.7–4.0)
MCH: 31.3 pg (ref 26.0–34.0)
MCHC: 35.7 g/dL (ref 30.0–36.0)
MCV: 87.8 fL (ref 78.0–100.0)
Monocytes Absolute: 0.4 10*3/uL (ref 0.1–1.0)
Monocytes Relative: 7 % (ref 3–12)
Neutro Abs: 3.6 10*3/uL (ref 1.7–7.7)
Neutrophils Relative %: 64 % (ref 43–77)
Platelets: 240 10*3/uL (ref 150–400)
RBC: 5.08 MIL/uL (ref 4.22–5.81)
RDW: 13.1 % (ref 11.5–15.5)
WBC: 5.6 10*3/uL (ref 4.0–10.5)

## 2013-10-06 LAB — LIPID PANEL
Cholesterol: 138 mg/dL (ref 0–200)
HDL: 46 mg/dL (ref >39)
LDL Cholesterol: 78 mg/dL (ref 0–99)
Total CHOL/HDL Ratio: 3 Ratio
Triglycerides: 70 mg/dL (ref <150)
VLDL: 14 mg/dL (ref 0–40)

## 2013-10-06 LAB — PSA: PSA: 2.06 ng/mL (ref <=4.00)

## 2013-10-06 LAB — BASIC METABOLIC PANEL WITH GFR
BUN: 9 mg/dL (ref 6–23)
CO2: 31 mEq/L (ref 19–32)
Calcium: 9.7 mg/dL (ref 8.4–10.5)
Chloride: 94 mEq/L — ABNORMAL LOW (ref 96–112)
Creat: 0.98 mg/dL (ref 0.50–1.35)
GFR, Est African American: >89 mL/min
GFR, Est Non African American: 82 mL/min
Glucose, Bld: 96 mg/dL (ref 70–99)
Potassium: 4.7 mEq/L (ref 3.5–5.3)
Sodium: 132 mEq/L — ABNORMAL LOW (ref 135–145)

## 2013-10-06 LAB — HEMOGLOBIN A1C
Hgb A1c MFr Bld: 5.6 % (ref <5.7)
Mean Plasma Glucose: 114 mg/dL (ref <117)

## 2013-10-06 LAB — HEPATIC FUNCTION PANEL
ALT: 16 U/L (ref 0–53)
AST: 18 U/L (ref 0–37)
Albumin: 4.7 g/dL (ref 3.5–5.2)
Alkaline Phosphatase: 63 U/L (ref 39–117)
Bilirubin, Direct: 0.2 mg/dL (ref 0.0–0.3)
Indirect Bilirubin: 1.1 mg/dL — ABNORMAL HIGH (ref 0.0–0.9)
Total Bilirubin: 1.3 mg/dL — ABNORMAL HIGH (ref 0.3–1.2)
Total Protein: 7.1 g/dL (ref 6.0–8.3)

## 2013-10-06 LAB — MAGNESIUM: Magnesium: 1.6 mg/dL (ref 1.5–2.5)

## 2013-10-06 NOTE — Patient Instructions (Signed)
PREDiabetes Meal Planning Guide The diabetes meal planning guide is a tool to help you plan your meals and snacks. It is important for people with diabetes to manage their blood glucose (sugar) levels. Choosing the right foods and the right amounts throughout your day will help control your blood glucose. Eating right can even help you improve your blood pressure and reach or maintain a healthy weight. CARBOHYDRATE COUNTING MADE EASY When you eat carbohydrates, they turn to sugar. This raises your blood glucose level. Counting carbohydrates can help you control this level so you feel better. When you plan your meals by counting carbohydrates, you can have more flexibility in what you eat and balance your medicine with your food intake. Carbohydrate counting simply means adding up the total amount of carbohydrate grams in your meals and snacks. Try to eat about the same amount at each meal. Foods with carbohydrates are listed below. Each portion below is 1 carbohydrate serving or 15 grams of carbohydrates. Ask your dietician how many grams of carbohydrates you should eat at each meal or snack. Grains and Starches  1 slice bread.   English muffin or hotdog/hamburger bun.   cup cold cereal (unsweetened).   cup cooked pasta or rice.   cup starchy vegetables (corn, potatoes, peas, beans, winter squash).  1 tortilla (6 inches).   bagel.  1 waffle or pancake (size of a CD).   cup cooked cereal.  4 to 6 small crackers. *Whole grain is recommended. Fruit  1 cup fresh unsweetened berries, melon, papaya, pineapple.  1 small fresh fruit.   banana or mango.   cup fruit juice (4 oz unsweetened).   cup canned fruit in natural juice or water.  2 tbs dried fruit.  12 to 15 grapes or cherries. Milk and Yogurt  1 cup fat-free or 1% milk.  1 cup soy milk.  6 oz light yogurt with sugar-free sweetener.  6 oz low-fat soy yogurt.  6 oz plain yogurt. Vegetables  1 cup raw or   cup cooked is counted as 0 carbohydrates or a "free" food.  If you eat 3 or more servings at 1 meal, count them as 1 carbohydrate serving. Other Carbohydrates   oz chips or pretzels.   cup ice cream or frozen yogurt.   cup sherbet or sorbet.  2 inch square cake, no frosting.  1 tbs honey, sugar, jam, jelly, or syrup.  2 small cookies.  3 squares of graham crackers.  3 cups popcorn.  6 crackers.  1 cup broth-based soup.  Count 1 cup casserole or other mixed foods as 2 carbohydrate servings.  Foods with less than 20 calories in a serving may be counted as 0 carbohydrates or a "free" food. You may want to purchase a book or computer software that lists the carbohydrate gram counts of different foods. In addition, the nutrition facts panel on the labels of the foods you eat are a good source of this information. The label will tell you how big the serving size is and the total number of carbohydrate grams you will be eating per serving. Divide this number by 15 to obtain the number of carbohydrate servings in a portion. Remember, 1 carbohydrate serving equals 15 grams of carbohydrate. SERVING SIZES Measuring foods and serving sizes helps you make sure you are getting the right amount of food. The list below tells how big or small some common serving sizes are.  1 oz.........4 stacked dice.  3 oz........Marland KitchenDeck of cards.  1 tsp.......Marland KitchenTip  of little finger.  1 tbs......Marland KitchenMarland KitchenThumb.  2 tbs.......Marland KitchenGolf ball.   cup......Marland KitchenHalf of a fist.  1 cup.......Marland KitchenA fist. SAMPLE DIABETES MEAL PLAN Below is a sample meal plan that includes foods from the grain and starches, dairy, vegetable, fruit, and meat groups. A dietician can individualize a meal plan to fit your calorie needs and tell you the number of servings needed from each food group. However, controlling the total amount of carbohydrates in your meal or snack is more important than making sure you include all of the food groups at  every meal. You may interchange carbohydrate containing foods (dairy, starches, and fruits). The meal plan below is an example of a 2000 calorie diet using carbohydrate counting. This meal plan has 17 carbohydrate servings. Breakfast  1 cup oatmeal (2 carb servings).   cup light yogurt (1 carb serving).  1 cup blueberries (1 carb serving).   cup almonds. Snack  1 large apple (2 carb servings).  1 low-fat string cheese stick. Lunch  Chicken breast salad.  1 cup spinach.   cup chopped tomatoes.  2 oz chicken breast, sliced.  2 tbs low-fat [place] dressing.  12 whole-wheat crackers (2 carb servings).  12 to 15 grapes (1 carb serving).  1 cup low-fat milk (1 carb serving). Snack  1 cup carrots.   cup hummus (1 carb serving). Dinner  3 oz broiled salmon.  1 cup brown rice (3 carb servings). Snack  1  cups steamed broccoli (1 carb serving) drizzled with 1 tsp olive oil and lemon juice.  1 cup light pudding (2 carb servings). DIABETES MEAL PLANNING WORKSHEET Your dietician can use this worksheet to help you decide how many servings of foods and what types of foods are right for you.  BREAKFAST Food Group and Servings / Carb Servings Grain/Starches __________________________________ Dairy __________________________________________ Vegetable ______________________________________ Fruit ___________________________________________ Meat __________________________________________ Fat ____________________________________________ LUNCH Food Group and Servings / Carb Servings Grain/Starches ___________________________________ Dairy ___________________________________________ Fruit ____________________________________________ Meat ___________________________________________ Fat _____________________________________________ Arthur Garcia Food Group and Servings / Carb Servings Grain/Starches ___________________________________ Dairy  ___________________________________________ Fruit ____________________________________________ Meat ___________________________________________ Fat _____________________________________________ SNACKS Food Group and Servings / Carb Servings Grain/Starches ___________________________________ Dairy ___________________________________________ Vegetable _______________________________________ Fruit ____________________________________________ Meat ___________________________________________ Fat _____________________________________________ DAILY TOTALS Starches _________________________ Vegetable ________________________ Fruit ____________________________ Dairy ____________________________ Meat ____________________________ Fat ______________________________ Document Released: 06/12/2005 Document Revised: 12/08/2011 Document Reviewed: 04/23/2009 [employer] Patient Information 2014 [employer] Smoking Cessation, Tips for Success YOU CAN QUIT SMOKING If you are ready to quit smoking, congratulations! You have chosen to help yourself be healthier. Cigarettes bring nicotine, tar, carbon monoxide, and other irritants into your body. Your lungs, heart, and blood vessels will be able to work better without these poisons. There are many different ways to quit smoking. Nicotine gum, nicotine patches, a nicotine inhaler, or nicotine nasal spray can help with physical craving. Hypnosis, support groups, and medicines help break the habit of smoking. Here are some tips to help you quit for good.  Throw away all cigarettes.  Clean and remove all ashtrays from your home, work, and car.  On a card, write down your reasons for quitting. Carry the card with you and read it when you get the urge to smoke.  Cleanse your body of nicotine. Drink enough water and fluids to keep your urine clear or pale yellow. Do this after quitting to flush the nicotine from your body.  Learn to predict your moods. Do not  let a bad situation be your excuse to have a cigarette. Some situations in your  life might tempt you into wanting a cigarette.  Never have "just one" cigarette. It leads to wanting another and another. Remind yourself of your decision to quit.  Change habits associated with smoking. If you smoked while driving or when feeling stressed, try other activities to replace smoking. Stand up when drinking your coffee. Brush your teeth after eating. Sit in a different chair when you read the paper. Avoid alcohol while trying to quit, and try to drink fewer caffeinated beverages. Alcohol and caffeine may urge you to smoke.  Avoid foods and drinks that can trigger a desire to smoke, such as sugary or spicy foods and alcohol.  Ask people who smoke not to smoke around you.  Have something planned to do right after eating or having a cup of coffee. Take a walk or exercise to perk you up. This will help to keep you from overeating.  Try a relaxation exercise to calm you down and decrease your stress. Remember, you may be tense and nervous for the first 2 weeks after you quit, but this will pass.  Find new activities to keep your hands busy. Play with a pen, coin, or rubber band. Doodle or draw things on paper.  Brush your teeth right after eating. This will help cut down on the craving for the taste of tobacco after meals. You can try mouthwash, too.  Use oral substitutes, such as lemon drops, carrots, a cinnamon stick, or chewing gum, in place of cigarettes. Keep them handy so they are available when you have the urge to smoke.  When you have the urge to smoke, try deep breathing.  Designate your home as a nonsmoking area.  If you are a heavy smoker, ask your caregiver about a prescription for nicotine chewing gum. It can ease your withdrawal from nicotine.  Reward yourself. Set aside the cigarette money you save and buy yourself something nice.  Look for support from others. Join a support group or  smoking cessation program. Ask someone at home or at work to help you with your plan to quit smoking.  Always ask yourself, "Do I need this cigarette or is this just a reflex?" Tell yourself, "Today, I choose not to smoke," or "I do not want to smoke." You are reminding yourself of your decision to quit, even if you do smoke a cigarette. HOW WILL I FEEL WHEN I QUIT SMOKING?  The benefits of not smoking start within days of quitting.  You may have symptoms of withdrawal because your body is used to nicotine (the addictive substance in cigarettes). You may crave cigarettes, be irritable, feel very hungry, cough often, get headaches, or have difficulty concentrating.  The withdrawal symptoms are only temporary. They are strongest when you first quit but will go away within 10 to 14 days.  When withdrawal symptoms occur, stay in control. Think about your reasons for quitting. Remind yourself that these are signs that your body is healing and getting used to being without cigarettes.  Remember that withdrawal symptoms are easier to treat than the major diseases that smoking can cause.  Even after the withdrawal is over, expect periodic urges to smoke. However, these cravings are generally short-lived and will go away whether you smoke or not. Do not smoke!  If you relapse and smoke again, do not lose hope. Most smokers quit 3 times before they are successful.  If you relapse, do not give up! Plan ahead and think about what you will do the next time  you get the urge to smoke. LIFE AS A NONSMOKER: MAKE IT FOR A MONTH, MAKE IT FOR LIFE Day 1: Hang this page where you will see it every day. Day 2: Get rid of all ashtrays, matches, and lighters. Day 3: Drink water. Breathe deeply between sips. Day 4: Avoid places with smoke-filled air, such as bars, clubs, or the smoking section of restaurants. Day 5: Keep track of how much money you save by not smoking. Day 6: Avoid boredom. Keep a good book with  you or go to the movies. Day 7: Reward yourself! One week without smoking! Day 8: Make a dental appointment to get your teeth cleaned. Day 9: Decide how you will turn down a cigarette before it is offered to you. Day 10: Review your reasons for quitting. Day 11: Distract yourself. Stay active to keep your mind off smoking and to relieve tension. Take a walk, exercise, read a book, do a crossword puzzle, or try a new hobby. Day 12: Exercise. Get off the bus before your stop or use stairs instead of escalators. Day 13: Call on friends for support and encouragement. Day 14: Reward yourself! Two weeks without smoking! Day 15: Practice deep breathing exercises. Day 16: Bet a friend that you can stay a nonsmoker. Day 17: Ask to sit in nonsmoking sections of restaurants. Day 18: Hang up "No Smoking" signs. Day 19: Think of yourself as a nonsmoker. Day 20: Each morning, tell yourself you will not smoke. Day 21: Reward yourself! Three weeks without smoking! Day 22: Think of smoking in negative ways. Remember how it stains your teeth, gives you bad breath, and leaves you short of breath. Day 23: Eat a nutritious breakfast. Day 24:Do not relive your days as a smoker. Day 25: Hold a pencil in your hand when talking on the telephone. Day 26: Tell all your friends you do not smoke. Day 27: Think about how much better food tastes. Day 28: Remember, one cigarette is one too many. Day 29: Take up a hobby that will keep your hands busy. Day 30: Congratulations! One month without smoking! Give yourself a big reward. Your caregiver can direct you to community resources or hospitals for support, which may include:  Group support.  Education.  Hypnosis.  Subliminal therapy. Document Released: 06/13/2004 Document Revised: 12/08/2011 Document Reviewed: 03/03/2013 [facility] Patient Information 2014 [place], [place].

## 2013-10-07 ENCOUNTER — Other Ambulatory Visit: Payer: Self-pay | Admitting: Emergency Medicine

## 2013-10-07 LAB — URINALYSIS, ROUTINE W REFLEX MICROSCOPIC
Bilirubin Urine: NEGATIVE
Glucose, UA: NEGATIVE mg/dL
Hgb urine dipstick: NEGATIVE
Ketones, ur: NEGATIVE mg/dL
Leukocytes, UA: NEGATIVE
Nitrite: NEGATIVE
Protein, ur: NEGATIVE mg/dL
Specific Gravity, Urine: 1.005 (ref 1.005–1.030)
Urobilinogen, UA: 1 mg/dL (ref 0.0–1.0)
pH: 7 (ref 5.0–8.0)

## 2013-10-07 LAB — TSH: TSH: 0.77 u[IU]/mL (ref 0.350–4.500)

## 2013-10-07 LAB — MICROALBUMIN / CREATININE URINE RATIO
Creatinine, Urine: 78 mg/dL
Microalb Creat Ratio: 6.4 mg/g (ref 0.0–30.0)
Microalb, Ur: 0.5 mg/dL (ref 0.00–1.89)

## 2013-10-07 LAB — VITAMIN D 25 HYDROXY (VIT D DEFICIENCY, FRACTURES): Vit D, 25-Hydroxy: 77 ng/mL (ref 30–89)

## 2013-10-07 LAB — INSULIN, FASTING: Insulin fasting, serum: 11 u[IU]/mL (ref 3–28)

## 2013-10-07 MED ORDER — ZOLPIDEM TARTRATE 5 MG PO TABS: 5.0000 mg | ORAL_TABLET | Freq: Every evening | ORAL | Status: DC | PRN

## 2013-10-09 NOTE — Progress Notes (Signed)
Subjective:    Patient ID: Arthur Garcia, male    DOB: [birth year], 63 y.o.   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 Deficient. He is not eating healthy. He keeps active but denies cardio. He is still smoking < 1/2 ppd. He notes his anxiety is well controlled and has not had to use xanax for several months.   He notes he keeps chronic allergy drainage and dry cough. He denies any other symptoms.   He notes his reflux has been controlled and no recent flares. He did not f/u AD for colonoscopy recheck with Dr Fuller Plan with polyp HX. He denies any GI sx.  Hyperlipidemia  Hypertension Associated symptoms include anxiety.  Anxiety     Current Outpatient Prescriptions on File Prior to Visit  Medication Sig Dispense Refill  . atorvastatin (LIPITOR) 20 MG tablet Take 20 mg by mouth daily.        . pantoprazole (PROTONIX) 40 MG tablet Take 40 mg by mouth daily.        . valsartan (DIOVAN) 160 MG tablet Take 160 mg by mouth daily.         No current facility-administered medications on file prior to visit.   ALLERGIES Finasteride  Past Medical History  Diagnosis Date  . Hypertension   . Hyperlipidemia   . Abnormal liver function test   . GERD (gastroesophageal reflux disease)   . Anxiety   . DDD (degenerative disc disease)   . Tobacco dependence   . Insomnia   . Colon polyp     Past Surgical History  Procedure Laterality Date  . Uvuectomy    . Appendectomy    . Vasectomy      History  Substance Use Topics  . Smoking status: Current Every Day Smoker -- 1.00 packs/day  . Smokeless tobacco: Not on file  . Alcohol Use: 9.0 oz/week    15 Cans of beer per week   Family History  Problem Relation Age of Onset  . Thyroid disease Mother   . Hypertension Mother   . Osteoporosis Mother   . Glaucoma Mother   . Thyroid disease Father   . Cancer Father   . Hypertension Father   . Hyperlipidemia Father   .  Heart disease Father   . Heart disease Paternal Grandfather      Review of Systems  Eyes:       GOULD 2014 WNL  Respiratory: Positive for cough.   Cardiovascular:       HOCHREIN- PRN 12/2010 STRESS TEST WNL  Gastrointestinal:       STARK- OVERDUE 2013 FOR COLONOSCOPY RECHECK  Genitourinary:       NESI - PRN  Skin:       LUPTON-PRN 2013 WNL  All other systems reviewed and are negative.    BP 122/80  Pulse 68  Temp(Src) 98.2 F (36.8 C) (Temporal)  Resp 18  Ht 6' 1.75" (1.873 m)  Wt 210 lb (95.255 kg)  BMI 27.15 kg/m2     Objective:   Physical Exam  Nursing note and vitals reviewed. Constitutional: He is oriented to person, place, and time. He appears well-developed and well-nourished.  HENT:  Head: Normocephalic and atraumatic.  Right Ear: External ear normal.  Left Ear: External ear normal.  Nose: Nose normal.  Eyes: Conjunctivae and EOM are normal. Pupils are equal, round, and reactive to light. Right eye exhibits no discharge. Left eye  exhibits no discharge. No scleral icterus.  Neck: Normal range of motion. Neck supple. No JVD present. No tracheal deviation present. No thyromegaly present.  Cardiovascular: Normal rate, regular rhythm, normal heart sounds and intact distal pulses.   Pulmonary/Chest: Effort normal and breath sounds normal.  Abdominal: Soft. Bowel sounds are normal. He exhibits no distension and no mass. There is no tenderness. There is no rebound and no guarding.  Genitourinary:  Refuses DRE til 2016 CPE  Musculoskeletal: Normal range of motion. He exhibits no edema and no tenderness.  Lymphadenopathy:    He has no cervical adenopathy.  Neurological: He is alert and oriented to person, place, and time. He has normal reflexes. No cranial nerve deficit. He exhibits normal muscle tone. Coordination normal.  Skin: Skin is warm and dry. No rash noted. No erythema. No pallor.  Psychiatric: He has a normal mood and affect. His behavior is normal. Judgment  and thought content normal.      EKG NSCSPT WITH HX OF ? OLD INFARCT    Assessment & Plan:  1. CPE and  3 month F/U for HTN, Cholesterol, Pre-Dm, D. Deficient. Needs healthy diet, cardio QD and obtain healthy weight. Check Labs, Check BP if >130/80 call office 2. Cough vs tobacco dependence vs HX of ? Reflux vs Allergic rhinitis- Allegra OTC, increase H2o, allergy hygiene explained. Tobacco cessation discussed. CXR to evaluate

## 2014-01-12 ENCOUNTER — Other Ambulatory Visit: Payer: Self-pay | Admitting: Internal Medicine

## 2014-01-12 DIAGNOSIS — G4700 Insomnia, unspecified: Secondary | ICD-10-CM

## 2014-01-12 MED ORDER — ZOLPIDEM TARTRATE 5 MG PO TABS: 5.0000 mg | ORAL_TABLET | Freq: Every evening | ORAL | Status: DC | PRN

## 2014-01-17 ENCOUNTER — Ambulatory Visit: Payer: Self-pay | Admitting: Emergency Medicine

## 2014-01-18 ENCOUNTER — Ambulatory Visit (INDEPENDENT_AMBULATORY_CARE_PROVIDER_SITE_OTHER): Payer: BC Managed Care – PPO | Admitting: Emergency Medicine

## 2014-01-18 VITALS — BP 142/76 | HR 72 | Temp 98.2°F | Resp 18 | Ht 73.75 in | Wt 204.0 lb

## 2014-01-18 DIAGNOSIS — N529 Male erectile dysfunction, unspecified: Secondary | ICD-10-CM

## 2014-01-18 DIAGNOSIS — R7309 Other abnormal glucose: Secondary | ICD-10-CM

## 2014-01-18 DIAGNOSIS — E782 Mixed hyperlipidemia: Secondary | ICD-10-CM

## 2014-01-18 DIAGNOSIS — I10 Essential (primary) hypertension: Secondary | ICD-10-CM

## 2014-01-18 LAB — CBC WITH DIFFERENTIAL/PLATELET
Basophils Absolute: 0.1 10*3/uL (ref 0.0–0.1)
Basophils Relative: 1 % (ref 0–1)
Eosinophils Absolute: 0.3 10*3/uL (ref 0.0–0.7)
Eosinophils Relative: 6 % — ABNORMAL HIGH (ref 0–5)
HCT: 43.3 % (ref 39.0–52.0)
Hemoglobin: 15.4 g/dL (ref 13.0–17.0)
Lymphocytes Relative: 22 % (ref 12–46)
Lymphs Abs: 1.2 10*3/uL (ref 0.7–4.0)
MCH: 31 pg (ref 26.0–34.0)
MCHC: 35.6 g/dL (ref 30.0–36.0)
MCV: 87.1 fL (ref 78.0–100.0)
Monocytes Absolute: 0.4 10*3/uL (ref 0.1–1.0)
Monocytes Relative: 8 % (ref 3–12)
Neutro Abs: 3.5 10*3/uL (ref 1.7–7.7)
Neutrophils Relative %: 63 % (ref 43–77)
Platelets: 240 10*3/uL (ref 150–400)
RBC: 4.97 MIL/uL (ref 4.22–5.81)
RDW: 13.1 % (ref 11.5–15.5)
WBC: 5.6 10*3/uL (ref 4.0–10.5)

## 2014-01-18 LAB — HEMOGLOBIN A1C
Hgb A1c MFr Bld: 5.8 % — ABNORMAL HIGH (ref <5.7)
Mean Plasma Glucose: 120 mg/dL — ABNORMAL HIGH (ref <117)

## 2014-01-18 NOTE — Patient Instructions (Addendum)
Cholesterol Cholesterol is a type of fat. Your body needs a small amount of cholesterol, but too much can cause health problems. Certain problems include heart attacks, strokes, and not enough blood flow to your heart, brain, kidneys, or feet. You get cholesterol in 2 ways:  Naturally.  By eating certain foods. HOME CARE  Eat a low-fat diet:  Eat less eggs, whole dairy products (whole milk, cheese, and butter), fatty meats, and fried foods.  Eat more fruits, vegetables, whole-wheat breads, lean chicken, and fish.  Follow your exercise program as told by your doctor.  Keep your weight at a healthy level. Talk to your doctor about what is right for you.  Only take medicine as told by your doctor.  Get your cholesterol checked once a year or as told by your doctor. MAKE SURE YOU:  Understand these instructions.  Will watch your condition.  Will get help right away if you are not doing well or get worse. Document Released: 12/12/2008 Document Revised: 12/08/2011 Document Reviewed: 06/29/2013 [facility] Patient Information 2014 [place], [place]. Erectile Dysfunction Erectile dysfunction is the inability to get or sustain a good enough erection to have sexual intercourse. Erectile dysfunction may involve:  Inability to get an erection.  Lack of enough hardness to allow penetration.  Loss of the erection before sex is finished.  Premature ejaculation. CAUSES  Certain drugs, such as:  Pain relievers.  Antihistamines.  Antidepressants.  Blood pressure medicines.  Water pills (diuretics).  Ulcer medicines.  Muscle relaxants.  Illegal drugs.  Excessive drinking.  Psychological causes, such as:  Anxiety.  Depression.  Sadness.  Exhaustion.  Performance fear.  Stress.  Physical causes, such as:  Artery problems. This may include diabetes, smoking, liver disease, or atherosclerosis.  High blood pressure.  Hormonal problems, such as low  testosterone.  Obesity.  Nerve problems. This may include back or pelvic injuries, diabetes mellitus, multiple sclerosis, or Parkinson disease. SYMPTOMS  Inability to get an erection.  Lack of enough hardness to allow penetration.  Loss of the erection before sex is finished.  Premature ejaculation.  Normal erections at some times, but with frequent unsatisfactory episodes.  Orgasms that are not satisfactory in sensation or frequency.  Low sexual satisfaction in either partner because of erection problems.  A curved penis occurring with erection. The curve may cause pain or may be too curved to allow for intercourse.  Never having nighttime erections. DIAGNOSIS Your caregiver can often diagnose this condition by:  Performing a physical exam to find other diseases or specific problems with the penis.  Asking you detailed questions about the problem.  Performing blood tests to check for diabetes mellitus or to measure hormone levels.  Performing urine tests to find other underlying health conditions.  Performing an ultrasound exam to check for scarring.  Performing a test to check blood flow to the penis.  Doing a sleep study at home to measure nighttime erections. TREATMENT   You may be prescribed medicines by mouth.  You may be given medicine injections into the penis.  You may be prescribed a vacuum pump with a ring.  Penile implant surgery may be performed. You may receive:  An inflatable implant.  A semirigid implant.  Blood vessel surgery may be performed. HOME CARE INSTRUCTIONS  If you are prescribed oral medicine, you should take the medicine as prescribed. Do not increase the dosage without first discussing it with your physician.  If you are using self-injections, be careful to avoid any veins that are  on the surface of the penis. Apply pressure to the injection site for 5 minutes.  If you are using a vacuum pump, make sure you have read the  instructions before using it. Discuss any questions with your physician before taking the pump home. SEEK MEDICAL CARE IF:  You experience pain that is not responsive to the pain medicine you have been prescribed.  You experience nausea or vomiting. SEEK IMMEDIATE MEDICAL CARE IF:   When taking oral or injectable medications, you experience an erection that lasts longer than 4 hours. If your physician is unavailable, go to the nearest emergency room for evaluation. An erection that lasts much longer than 4 hours can result in permanent damage to your penis.  You have pain that is severe.  You develop redness, severe pain, or severe swelling of your penis.  You have redness spreading up into your groin or lower abdomen.  You are unable to pass your urine. Document Released: 09/12/2000 Document Revised: 05/18/2013 Document Reviewed: 02/17/2013 [facility] Patient Information 2014 [place].

## 2014-01-18 NOTE — Progress Notes (Signed)
Subjective:    Patient ID: Arthur Garcia, male    DOB: 10-05-1950, 63 y.o.   MRN: [id number]  HPI Comments: 63 yo WM presents for 3 month F/U for HTN, Cholesterol, Pre-Dm, D. Deficient. He is not eating healthy because of recent vacation with increased fried foods. He is keeping active. BP has not been checking. He has trouble getting and maintaining erection. He notes flushing sensation with cialis.  CHOL         138   10/06/2013 HDL           46   10/06/2013 LDLCALC       78   10/06/2013 TRIG          70   10/06/2013 CHOLHDL      3.0   10/06/2013 ALT           16   10/06/2013 AST           18   10/06/2013 ALKPHOS       63   10/06/2013 BILITOT      1.3   10/06/2013 WBC      5.6   10/06/2013 HGB     15.9   10/06/2013 HCT     44.6   10/06/2013 MCV     87.8   10/06/2013 PLT      240   10/06/2013 HGBA1C      5.6   10/06/2013   Hyperlipidemia  Hypertension      Medication List       This list is accurate as of: 01/18/14 12:11 PM.  Always use your most recent med list.               atorvastatin 20 MG tablet  Commonly known as:  LIPITOR  Take 20 mg by mouth daily.     pantoprazole 40 MG tablet  Commonly known as:  PROTONIX  Take 40 mg by mouth daily.     valsartan 160 MG tablet  Commonly known as:  DIOVAN  Take 160 mg by mouth daily.     Vitamin D-3 5000 UNITS Tabs  Take 5,000 Units by mouth.     zolpidem 5 MG tablet  Commonly known as:  AMBIEN  Take 1 tablet (5 mg total) by mouth at bedtime as needed for sleep.       Allergies  Allergen Reactions  . Finasteride    Past Medical History  Diagnosis Date  . Hypertension   . Hyperlipidemia   . Abnormal liver function test   . GERD (gastroesophageal reflux disease)   . Anxiety   . DDD (degenerative disc disease)   . Tobacco dependence   . Insomnia   . Colon polyp      Review of Systems  All other systems reviewed and are negative.  BP 142/76  Pulse 72  Temp(Src) 98.2 F (36.8 C) (Temporal)  Resp 18  Ht 6' 1.75" (1.873  m)  Wt 204 lb (92.534 kg)  BMI 26.38 kg/m2     Objective:   Physical Exam  Nursing note and vitals reviewed. Constitutional: He is oriented to person, place, and time. He appears well-developed and well-nourished.  HENT:  Head: Normocephalic and atraumatic.  Right Ear: External ear normal.  Left Ear: External ear normal.  Nose: Nose normal.  Eyes: Conjunctivae and EOM are normal.  Neck: Normal range of motion. Neck supple. No JVD present. No thyromegaly present.  Cardiovascular: Normal rate, regular rhythm, normal heart sounds and intact distal  pulses.   Pulmonary/Chest: Effort normal and breath sounds normal.  Abdominal: Soft. Bowel sounds are normal. He exhibits no distension and no mass. There is no tenderness. There is no rebound and no guarding.  Musculoskeletal: Normal range of motion. He exhibits no edema and no tenderness.  Lymphadenopathy:    He has no cervical adenopathy.  Neurological: He is alert and oriented to person, place, and time. He has normal reflexes. No cranial nerve deficit. Coordination normal.  Skin: Skin is warm and dry.  Psychiatric: He has a normal mood and affect. His behavior is normal. Judgment and thought content normal.          Assessment & Plan:  1.  3 month F/U for HTN, Cholesterol, Pre-Dm, D. Deficient. Needs healthy diet, cardio QD and obtain healthy weight. Check Labs, Check BP if >130/80 call office   2. ED- SX of Viagra 50 mg and Levitra given, Take AD, do not use together

## 2014-01-19 ENCOUNTER — Encounter: Payer: Self-pay | Admitting: Emergency Medicine

## 2014-01-19 LAB — BASIC METABOLIC PANEL WITH GFR
BUN: 13 mg/dL (ref 6–23)
CO2: 26 mEq/L (ref 19–32)
Calcium: 9.9 mg/dL (ref 8.4–10.5)
Chloride: 98 mEq/L (ref 96–112)
Creat: 0.97 mg/dL (ref 0.50–1.35)
GFR, Est African American: >89 mL/min
GFR, Est Non African American: 83 mL/min
Glucose, Bld: 116 mg/dL — ABNORMAL HIGH (ref 70–99)
Potassium: 4.8 mEq/L (ref 3.5–5.3)
Sodium: 134 mEq/L — ABNORMAL LOW (ref 135–145)

## 2014-01-19 LAB — HEPATIC FUNCTION PANEL
ALT: 14 U/L (ref 0–53)
AST: 17 U/L (ref 0–37)
Albumin: 4.7 g/dL (ref 3.5–5.2)
Alkaline Phosphatase: 58 U/L (ref 39–117)
Bilirubin, Direct: 0.2 mg/dL (ref 0.0–0.3)
Indirect Bilirubin: 0.6 mg/dL (ref 0.2–1.2)
Total Bilirubin: 0.8 mg/dL (ref 0.2–1.2)
Total Protein: 7.1 g/dL (ref 6.0–8.3)

## 2014-01-19 LAB — LIPID PANEL
Cholesterol: 139 mg/dL (ref 0–200)
HDL: 51 mg/dL (ref >39)
LDL Cholesterol: 78 mg/dL (ref 0–99)
Total CHOL/HDL Ratio: 2.7 Ratio
Triglycerides: 49 mg/dL (ref <150)
VLDL: 10 mg/dL (ref 0–40)

## 2014-01-19 LAB — TESTOSTERONE: Testosterone: 558 ng/dL (ref 300–890)

## 2014-01-19 LAB — INSULIN, FASTING: Insulin fasting, serum: 7 u[IU]/mL (ref 3–28)

## 2014-03-20 ENCOUNTER — Other Ambulatory Visit: Payer: Self-pay | Admitting: Emergency Medicine

## 2014-03-20 ENCOUNTER — Encounter: Payer: Self-pay | Admitting: Emergency Medicine

## 2014-03-20 MED ORDER — SILDENAFIL CITRATE 100 MG PO TABS: 100.0000 mg | ORAL_TABLET | ORAL | Status: DC | PRN

## 2014-03-21 ENCOUNTER — Other Ambulatory Visit: Payer: Self-pay | Admitting: Emergency Medicine

## 2014-03-21 MED ORDER — SILDENAFIL CITRATE 100 MG PO TABS: 100.0000 mg | ORAL_TABLET | ORAL | Status: DC | PRN

## 2014-03-25 ENCOUNTER — Other Ambulatory Visit: Payer: Self-pay | Admitting: Internal Medicine

## 2014-04-04 ENCOUNTER — Ambulatory Visit: Payer: Self-pay | Admitting: Emergency Medicine

## 2014-04-04 ENCOUNTER — Ambulatory Visit (INDEPENDENT_AMBULATORY_CARE_PROVIDER_SITE_OTHER): Payer: BC Managed Care – PPO | Admitting: Emergency Medicine

## 2014-04-04 ENCOUNTER — Encounter: Payer: Self-pay | Admitting: Emergency Medicine

## 2014-04-04 VITALS — BP 128/64 | HR 68 | Temp 98.2°F | Resp 18 | Ht 73.75 in | Wt 204.0 lb

## 2014-04-04 DIAGNOSIS — N529 Male erectile dysfunction, unspecified: Secondary | ICD-10-CM

## 2014-04-04 DIAGNOSIS — I10 Essential (primary) hypertension: Secondary | ICD-10-CM

## 2014-04-04 LAB — CBC WITH DIFFERENTIAL/PLATELET
Basophils Absolute: 0.1 10*3/uL (ref 0.0–0.1)
Basophils Relative: 1 % (ref 0–1)
Eosinophils Absolute: 0.4 10*3/uL (ref 0.0–0.7)
Eosinophils Relative: 6 % — ABNORMAL HIGH (ref 0–5)
HCT: 44.7 % (ref 39.0–52.0)
Hemoglobin: 15.8 g/dL (ref 13.0–17.0)
Lymphocytes Relative: 21 % (ref 12–46)
Lymphs Abs: 1.3 10*3/uL (ref 0.7–4.0)
MCH: 31.1 pg (ref 26.0–34.0)
MCHC: 35.3 g/dL (ref 30.0–36.0)
MCV: 88 fL (ref 78.0–100.0)
Monocytes Absolute: 0.4 10*3/uL (ref 0.1–1.0)
Monocytes Relative: 7 % (ref 3–12)
Neutro Abs: 4 10*3/uL (ref 1.7–7.7)
Neutrophils Relative %: 65 % (ref 43–77)
Platelets: 255 10*3/uL (ref 150–400)
RBC: 5.08 MIL/uL (ref 4.22–5.81)
RDW: 12.9 % (ref 11.5–15.5)
WBC: 6.2 10*3/uL (ref 4.0–10.5)

## 2014-04-04 LAB — BASIC METABOLIC PANEL WITH GFR
BUN: 15 mg/dL (ref 6–23)
CO2: 30 mEq/L (ref 19–32)
Calcium: 10.2 mg/dL (ref 8.4–10.5)
Chloride: 100 mEq/L (ref 96–112)
Creat: 1.11 mg/dL (ref 0.50–1.35)
GFR, Est African American: 81 mL/min
GFR, Est Non African American: 70 mL/min
Glucose, Bld: 119 mg/dL — ABNORMAL HIGH (ref 70–99)
Potassium: 4.2 mEq/L (ref 3.5–5.3)
Sodium: 138 mEq/L (ref 135–145)

## 2014-04-04 LAB — HEPATIC FUNCTION PANEL
ALT: 13 U/L (ref 0–53)
AST: 14 U/L (ref 0–37)
Albumin: 4.7 g/dL (ref 3.5–5.2)
Alkaline Phosphatase: 67 U/L (ref 39–117)
Bilirubin, Direct: 0.2 mg/dL (ref 0.0–0.3)
Indirect Bilirubin: 0.6 mg/dL (ref 0.2–1.2)
Total Bilirubin: 0.8 mg/dL (ref 0.2–1.2)
Total Protein: 7.2 g/dL (ref 6.0–8.3)

## 2014-04-04 MED ORDER — AVANAFIL 100 MG PO TABS: ORAL_TABLET | ORAL | Status: DC

## 2014-04-04 MED ORDER — SILDENAFIL CITRATE 100 MG PO TABS
100.0000 mg | ORAL_TABLET | ORAL | Status: DC | PRN
Start: 2014-04-04 — End: 2014-07-05

## 2014-04-04 NOTE — Progress Notes (Signed)
Subjective:    Patient ID: Arthur Garcia, male    DOB: 25-Jan-1951, 63 y.o.   MRN: [id number]  HPI Comments: 63 yo Wm for HTN and Erectile dysfunction OV. He has recently restarted having intercourse with his wife. He notes Viagra helps a little but unable to maintain erection. He has had intercourse once without Viagra after d/c ETOH daily use and was able to maintain erection and complete orgasism but he feels erections are not as firm as they used to be. He notes BP is good and he keeps active but denies cardio.     Medication List       This list is accurate as of: 04/04/14 12:01 PM.  Always use your most recent med list.               atorvastatin 20 MG tablet  Commonly known as:  LIPITOR  TAKE 1 TABLET DAILY FOR CHOLESTEROL     pantoprazole 40 MG tablet  Commonly known as:  PROTONIX  Take 40 mg by mouth daily.     sildenafil 100 MG tablet  Commonly known as:  VIAGRA  Take 1 tablet (100 mg total) by mouth as needed for erectile dysfunction.     valsartan 160 MG tablet  Commonly known as:  DIOVAN  Take 160 mg by mouth daily.     Vitamin D-3 5000 UNITS Tabs  Take 5,000 Units by mouth.     zolpidem 5 MG tablet  Commonly known as:  AMBIEN  Take 1 tablet (5 mg total) by mouth at bedtime as needed for sleep.        Allergies  Allergen Reactions  . Finasteride    Past Medical History  Diagnosis Date  . Hypertension   . Hyperlipidemia   . Abnormal liver function test   . GERD (gastroesophageal reflux disease)   . Anxiety   . DDD (degenerative disc disease)   . Tobacco dependence   . Insomnia   . Colon polyp      Review of Systems  Genitourinary:       ED  All other systems reviewed and are negative.  BP 128/64  Pulse 68  Temp(Src) 98.2 F (36.8 C) (Temporal)  Resp 18  Ht 6' 1.75" (1.873 m)  Wt 204 lb (92.534 kg)  BMI 26.38 kg/m2     Objective:   Physical Exam  Nursing note and vitals reviewed. Constitutional: He is oriented to person,  place, and time. He appears well-developed and well-nourished.  HENT:  Head: Normocephalic and atraumatic.  Right Ear: External ear normal.  Left Ear: External ear normal.  Nose: Nose normal.  Eyes: Conjunctivae are normal.  Neck: Normal range of motion.  Cardiovascular: Normal rate, regular rhythm, normal heart sounds and intact distal pulses.   Pulmonary/Chest: Effort normal and breath sounds normal.  Abdominal: Soft. Bowel sounds are normal. He exhibits no distension. There is no tenderness.  Genitourinary:  Declines  Musculoskeletal: Normal range of motion.  Lymphadenopathy:    He has no cervical adenopathy.  Neurological: He is alert and oriented to person, place, and time.  Skin: Skin is warm and dry.  Psychiatric: He has a normal mood and affect. Judgment normal.          Assessment & Plan:  1. ED-Decrease Diovan 160 to 1/2 = 80 mg, monitor BP call if >130/80. Trial of Stnedra 100 mg 1 qd SX #2 packs given with RX #12. Increase cardio, decrease tobacco. W/c if desires referral  to Urology for further evaluation. Long discussion about mental aspect of erections and changes associated with age.   2. HTN- Check BP call if >130/80, increase cardio

## 2014-04-05 ENCOUNTER — Encounter: Payer: Self-pay | Admitting: Emergency Medicine

## 2014-04-05 DIAGNOSIS — N529 Male erectile dysfunction, unspecified: Secondary | ICD-10-CM | POA: Insufficient documentation

## 2014-04-05 HISTORY — DX: Male erectile dysfunction, unspecified: N52.9

## 2014-04-20 ENCOUNTER — Ambulatory Visit (INDEPENDENT_AMBULATORY_CARE_PROVIDER_SITE_OTHER): Payer: BC Managed Care – PPO | Admitting: Physician Assistant

## 2014-04-20 ENCOUNTER — Encounter: Payer: Self-pay | Admitting: Physician Assistant

## 2014-04-20 VITALS — BP 148/88 | HR 68 | Temp 98.1°F | Resp 16 | Wt 206.0 lb

## 2014-04-20 DIAGNOSIS — G4700 Insomnia, unspecified: Secondary | ICD-10-CM

## 2014-04-20 MED ORDER — TADALAFIL 5 MG PO TABS: 5.0000 mg | ORAL_TABLET | Freq: Every day | ORAL | Status: DC | PRN

## 2014-04-20 MED ORDER — CYCLOBENZAPRINE HCL 10 MG PO TABS: 10.0000 mg | ORAL_TABLET | Freq: Three times a day (TID) | ORAL | Status: DC | PRN

## 2014-04-20 MED ORDER — ZOLPIDEM TARTRATE 5 MG PO TABS: 5.0000 mg | ORAL_TABLET | Freq: Every evening | ORAL | Status: DC | PRN

## 2014-04-20 NOTE — Progress Notes (Signed)
   Subjective:    Patient ID: Arthur Garcia, male    DOB: 1951/06/14, 63 y.o.   MRN: [id number]  HPI 63 y.o. male with right leg pain. 10 days ago was at the beach taking down a door, when it completely came off the hinges it was heavier than he thought. He was carrying it outside when he felt a sharp instant pain in his lower back and felt a pop in his right thigh. He continues to have pain from lateral thigh to the top of his knee. Sitting for a long time is worse. Has been taking 3 aleve every 8 hours which is helping some. No numbness tingling, weakness  Has + ED that is variable, can masturbate and have erections in the morning but will have ED with his wife. He has tried Licensed conveyancer, stendra, and viagra that have not helped. He is wondering if it can be psychological. Normal PSA, testosterone.    Review of Systems  Constitutional: Negative.   HENT: Negative.   Respiratory: Negative.   Cardiovascular: Negative.   Gastrointestinal: Negative.   Endocrine: Negative.   Genitourinary: Negative for dysuria, urgency, frequency, hematuria, flank pain, decreased urine volume, discharge, penile swelling, scrotal swelling, enuresis, difficulty urinating, genital sores, penile pain and testicular pain.  Musculoskeletal: Positive for myalgias. Negative for back pain, gait problem and joint swelling.  Neurological: Negative.        Objective:   Physical Exam  Constitutional: He is oriented to person, place, and time. He appears well-developed and well-nourished.  HENT:  Head: Normocephalic and atraumatic.  Right Ear: External ear normal.  Left Ear: External ear normal.  Mouth/Throat: Oropharynx is clear and moist.  Eyes: Conjunctivae and EOM are normal. Pupils are equal, round, and reactive to light.  Neck: Normal range of motion. Neck supple.  Cardiovascular: Normal rate, regular rhythm and normal heart sounds.   Pulmonary/Chest: Effort normal and breath sounds normal.  Abdominal: Soft.  Bowel sounds are normal.  Musculoskeletal: Normal range of motion.  Full ROM right leg, no ecchymosis or tenderness.   Neurological: He is alert and oriented to person, place, and time. No cranial nerve deficit.  Skin: Skin is warm and dry.  Psychiatric: He has a normal mood and affect. His behavior is normal.       Assessment & Plan:  Right hamstring strain questionable tear but unlikely-  Continue heat, stretches, aleve no more than 3 a day, muscle relaxer. If not better will do PT.   ED- try cialsis, if this does not help can refer to urology.

## 2014-04-20 NOTE — Patient Instructions (Addendum)
I am going to give you 5mg  Cialis. You can try just 1, or you can go up to 20mg  at one time (4 pills max). This last for 72 hours.  If you get chest pain, shortness of breath or an erection lasting longer than 4-5 hours please go to the ER.   Hamstring Strain with Rehab The hamstring muscle and tendons are vulnerable to muscle or tendon tear (strain). Hamstring tears cause pain and inflammation in the backside of the thigh, where the hamstring muscles are located. The hamstring is comprised of three muscles that are responsible for straightening the hip, bending the knee, and stabilizing the knee. These muscles are important for walking, running, and jumping. Hamstring strain is the most common injury of the thigh. Hamstring strains are classified as grade 1 or 2 strains. Grade 1 strains cause pain, but the tendon is not lengthened. Grade 2 strains include a lengthened ligament due to the ligament being stretched or partially ruptured. With grade 2 strains there is still function, although the function may be decreased.  SYMPTOMS   Pain, tenderness, swelling, warmth, or redness over the hamstring muscles, at the back of the thigh.  Pain that gets worse during and after intense activity.  A "pop" heard in the area, at the time of injury.  Muscle spasm in the hamstring muscles.  Pain or weakness with running, jumping, or bending the knee against resistance.  Crackling sound (crepitation) when the tendon is moved or touched.  Bruising (contusion) in the thigh within 48 hours of injury.  Loss of fullness of the muscle, or area of muscle bulging in the case of a complete rupture. CAUSES  A muscle strain occurs when a force is placed on the muscle or tendon that is greater than it can withstand. Common causes of injury include:  Strain from overuse or sudden increase in the frequency, duration, or intensity of activity.  Single violent blow or force to the back of the knee or the hamstring  area of the thigh. RISK INCREASES WITH:  Sports that require quick starts (sprinting, racquetball, tennis).  Sports that require jumping (basketball, volleyball).  Kicking sports and water skiing.  Contact sports (soccer, football).  Poor strength and flexibility.  Failure to warm up properly before activity.  Previous thigh, knee, or pelvis injury.  Poor exercise technique.  Poor posture. PREVENTION  Maintain physical fitness:  Strength, flexibility, and endurance.  Cardiovascular fitness.  Learn and use proper exercise technique and posture.  Wear proper fitted and padded protective equipment. PROGNOSIS  If treated properly, hamstring strains are usually curable in 2 to 6 weeks. RELATED COMPLICATIONS   Longer healing time, if not properly treated or if not given adequate time to heal.  Chronically inflamed tendon, causing persistent pain with activity that may progress to constant pain.  Recurring symptoms, if activity is resumed too soon.  Vulnerable to repeated injury (in up to 25% of cases). TREATMENT  Treatment first involves the use of ice and medication to help reduce pain and inflammation. It is also important to complete strengthening and stretching exercises, as well as modifying any activities that aggravate the symptoms. These exercises may be completed at home or with a therapist. Your caregiver may recommend the use of crutches to help reduce pain and discomfort, especially is the strain is severe enough to cause limping. If the tendon has pulled away from the bone, then surgery is necessary to reattach it. MEDICATION   If pain medicine is needed, nonsteroidal  anti-inflammatory medicines (aspirin and ibuprofen), or other minor pain relievers (acetaminophen), are often advised.  Do not take pain medicine for 7 days before surgery.  Prescription pain relievers may be given if your caregiver thinks they are needed. Use only as directed and only as much as  you need.  Corticosteroid injections may be recommended. However, these injections should only be used for serious cases, as they can only be given a certain number of times.  Ointments applied to the skin may be beneficial. HEAT AND COLD  Cold treatment (icing) relieves pain and reduces inflammation. Cold treatment should be applied for 10 to 15 minutes every 2 to 3 hours, and immediately after activity that aggravates your symptoms. Use ice packs or an ice massage.  Heat treatment may be used before performing the stretching and strengthening activities prescribed by your caregiver, physical therapist, or athletic trainer. Use a heat pack or a warm water soak. SEEK MEDICAL CARE IF:   Symptoms get worse or do not improve in 2 weeks, despite treatment.  New, unexplained symptoms develop. (Drugs used in treatment may produce side effects.) EXERCISES RANGE OF MOTION (ROM) AND STRETCHING EXERCISES - Hamstring Strain These exercises may help you when beginning to rehabilitate your injury. Your symptoms may go away with or without further involvement from your physician, physical therapist or athletic trainer. While completing these exercises, remember:   Restoring tissue flexibility helps normal motion to return to the joints. This allows healthier, less painful movement and activity.  An effective stretch should be held for at least 30 seconds.  A stretch should never be painful. You should only feel a gentle lengthening or release in the stretched tissue. STRETCH - Hamstrings, Standing  Stand or sit, and extend your right / left leg, placing your foot on a chair or foot stool.  Keep a slight arch in your low back and your hips straight forward.  Lead with your chest, and lean forward at the waist until you feel a gentle stretch in the back of your right / left knee or thigh. (When done correctly, this exercise requires leaning only a small distance.)  Hold this position for __________  seconds. Repeat __________ times. Complete this stretch __________ times per day. STRETCH - Hamstrings, Supine   Lie on your back. Loop a belt or towel over the ball of your right / left foot.  Straighten your right / left knee and slowly pull on the belt to raise your leg. Do not allow the right / left knee to bend. Keep your opposite leg flat on the floor.  Raise the leg until you feel a gentle stretch behind your right / left knee or thigh. Hold this position for __________ seconds. Repeat __________ times. Complete this stretch __________ times per day.  STRETCH - Hamstrings, Doorway  Lie on your back with your right / left leg extended and resting on the wall, and the opposite leg flat on the ground through the door. Initially, position your bottom farther away from the wall.  Keep your right / left knee straight. If you feel a stretch behind your knee or thigh, hold this position for __________ seconds.  If you do not feel a stretch, scoot your bottom closer to the door and hold __________ seconds. Repeat __________ times. Complete this stretch __________ times per day.  STRETCH - Hamstrings/Adductors, V-Sit   Sit on the floor with your legs extended in a large "V," keeping your knees straight.  With your head and  chest upright, bend at your waist reaching for your left foot to stretch your right thigh muscles.  You should feel a stretch in your right inner thigh. Hold for __________ seconds.  Return to the upright position to relax your leg muscles.  Continuing to keep your chest upright, bend straight forward at your waist to stretch your hamstrings.  You should feel a stretch behind both of your thighs and knees. Hold for __________ seconds.  Return to the upright position to relax your leg muscles.  With your head and chest upright, bend at your waist reaching for your right foot to stretch your left thigh muscles.  You should feel a stretch in your left inner thigh.  Hold for __________ seconds.  Return to the upright position to relax your leg muscles. Repeat __________ times. Complete this exercise __________ times per day.  STRENGTHENING EXERCISES - Hamstring Strain These exercises may help you when beginning to rehabilitate your injury. They may resolve your symptoms with or without further involvement from your physician, physical therapist or athletic trainer. While completing these exercises, remember:   Muscles can gain both the endurance and the strength needed for everyday activities through controlled exercises.  Complete these exercises as instructed by your physician, physical therapist or athletic trainer. Increase the resistance and repetitions only as guided.  You may experience muscle soreness or fatigue, but the pain or discomfort you are trying to eliminate should never get worse during these exercises. If this pain does get worse, stop and make certain you are following the directions exactly. If the pain is still present after adjustments, discontinue the exercise until you can discuss the trouble with your clinician. STRENGTH - Hip Extensors, Straight Leg Raises   Lie on your stomach on a firm surface.  Tense the muscles in your buttocks to lift your right / left leg about 4 inches. If you cannot lift your leg this high without arching your back, place a pillow under your hips.  Keep your knee straight. Hold for __________ seconds.  Slowly lower your leg to the starting position and allow it to relax completely before starting the next repetition. Repeat __________ times. Complete this exercise __________ times per day.  STRENGTH - Hamstring, Isometrics   Lie on your back on a firm surface.  Bend your right / left knee approximately __________ degrees.  Dig your heel into the surface, as if you are trying to pull it toward your buttocks. Tighten the muscles in the back of your thighs to "dig" as hard as you can, without  increasing any pain.  Hold this position for __________ seconds.  Release the tension gradually and allow your muscles to completely relax for __________ seconds between each exercise. Repeat __________ times. Complete this exercise __________ times per day.  STRENGTH - Hamstring, Curls   Lay on your stomach with your legs extended. (If you lay on a bed, your feet may hang over the edge.)  Tighten the muscles in the back of your thigh to bend your right / left knee up to 90 degrees. Keep your hips flat on the bed or floor.  Hold this position for __________ seconds.  Slowly lower your leg back to the starting position. Repeat __________ times. Complete this exercise __________ times per day.  OPTIONAL ANKLE WEIGHTS: Begin with ____________________, but DO NOT exceed ____________________. Increase in 1 pound/0.5 kilogram increments. Document Released: 09/15/2005 Document Revised: 12/08/2011 Document Reviewed: 12/28/2008 [facility] Patient Information 2015 [place], [place]. This information is not  intended to replace advice given to you by your health care provider. Make sure you discuss any questions you have with your health care provider.

## 2014-04-24 ENCOUNTER — Encounter: Payer: Self-pay | Admitting: Physician Assistant

## 2014-05-04 ENCOUNTER — Other Ambulatory Visit: Payer: Self-pay | Admitting: Internal Medicine

## 2014-05-22 ENCOUNTER — Ambulatory Visit: Payer: Self-pay | Admitting: Emergency Medicine

## 2014-05-29 ENCOUNTER — Encounter: Payer: Self-pay | Admitting: Physician Assistant

## 2014-05-29 MED ORDER — SILDENAFIL CITRATE 100 MG PO TABS: 100.0000 mg | ORAL_TABLET | ORAL | Status: DC | PRN

## 2014-07-05 ENCOUNTER — Ambulatory Visit (INDEPENDENT_AMBULATORY_CARE_PROVIDER_SITE_OTHER): Payer: BC Managed Care – PPO | Admitting: Physician Assistant

## 2014-07-05 ENCOUNTER — Ambulatory Visit: Payer: Self-pay | Admitting: Physician Assistant

## 2014-07-05 ENCOUNTER — Encounter: Payer: Self-pay | Admitting: Physician Assistant

## 2014-07-05 VITALS — BP 118/78 | HR 72 | Temp 97.7°F | Resp 16 | Ht 73.0 in | Wt 203.0 lb

## 2014-07-05 DIAGNOSIS — Z79899 Other long term (current) drug therapy: Secondary | ICD-10-CM

## 2014-07-05 DIAGNOSIS — Z23 Encounter for immunization: Secondary | ICD-10-CM

## 2014-07-05 DIAGNOSIS — F17200 Nicotine dependence, unspecified, uncomplicated: Secondary | ICD-10-CM

## 2014-07-05 DIAGNOSIS — R7309 Other abnormal glucose: Secondary | ICD-10-CM

## 2014-07-05 DIAGNOSIS — R7303 Prediabetes: Secondary | ICD-10-CM

## 2014-07-05 DIAGNOSIS — I10 Essential (primary) hypertension: Secondary | ICD-10-CM

## 2014-07-05 DIAGNOSIS — N528 Other male erectile dysfunction: Secondary | ICD-10-CM

## 2014-07-05 DIAGNOSIS — E785 Hyperlipidemia, unspecified: Secondary | ICD-10-CM

## 2014-07-05 DIAGNOSIS — E559 Vitamin D deficiency, unspecified: Secondary | ICD-10-CM

## 2014-07-05 LAB — LIPID PANEL
Cholesterol: 103 mg/dL (ref 0–200)
HDL: 40 mg/dL (ref >39)
LDL Cholesterol: 54 mg/dL (ref 0–99)
Total CHOL/HDL Ratio: 2.6 Ratio
Triglycerides: 47 mg/dL (ref <150)
VLDL: 9 mg/dL (ref 0–40)

## 2014-07-05 LAB — CBC WITH DIFFERENTIAL/PLATELET
Basophils Absolute: 0.1 10*3/uL (ref 0.0–0.1)
Basophils Relative: 2 % — ABNORMAL HIGH (ref 0–1)
Eosinophils Absolute: 0.4 10*3/uL (ref 0.0–0.7)
Eosinophils Relative: 8 % — ABNORMAL HIGH (ref 0–5)
HCT: 43.7 % (ref 39.0–52.0)
Hemoglobin: 15.1 g/dL (ref 13.0–17.0)
Lymphocytes Relative: 29 % (ref 12–46)
Lymphs Abs: 1.4 10*3/uL (ref 0.7–4.0)
MCH: 30.3 pg (ref 26.0–34.0)
MCHC: 34.6 g/dL (ref 30.0–36.0)
MCV: 87.8 fL (ref 78.0–100.0)
Monocytes Absolute: 0.4 10*3/uL (ref 0.1–1.0)
Monocytes Relative: 9 % (ref 3–12)
Neutro Abs: 2.4 10*3/uL (ref 1.7–7.7)
Neutrophils Relative %: 52 % (ref 43–77)
Platelets: 241 10*3/uL (ref 150–400)
RBC: 4.98 MIL/uL (ref 4.22–5.81)
RDW: 13.5 % (ref 11.5–15.5)
WBC: 4.7 10*3/uL (ref 4.0–10.5)

## 2014-07-05 LAB — BASIC METABOLIC PANEL WITH GFR
BUN: 16 mg/dL (ref 6–23)
CO2: 26 mEq/L (ref 19–32)
Calcium: 9.9 mg/dL (ref 8.4–10.5)
Chloride: 101 mEq/L (ref 96–112)
Creat: 1.05 mg/dL (ref 0.50–1.35)
GFR, Est African American: 87 mL/min
GFR, Est Non African American: 75 mL/min
Glucose, Bld: 90 mg/dL (ref 70–99)
Potassium: 4.1 mEq/L (ref 3.5–5.3)
Sodium: 138 mEq/L (ref 135–145)

## 2014-07-05 LAB — HEPATIC FUNCTION PANEL
ALT: 14 U/L (ref 0–53)
AST: 15 U/L (ref 0–37)
Albumin: 4.4 g/dL (ref 3.5–5.2)
Alkaline Phosphatase: 62 U/L (ref 39–117)
Bilirubin, Direct: 0.3 mg/dL (ref 0.0–0.3)
Indirect Bilirubin: 1.2 mg/dL (ref 0.2–1.2)
Total Bilirubin: 1.5 mg/dL — ABNORMAL HIGH (ref 0.2–1.2)
Total Protein: 6.9 g/dL (ref 6.0–8.3)

## 2014-07-05 LAB — MAGNESIUM: Magnesium: 1.6 mg/dL (ref 1.5–2.5)

## 2014-07-05 LAB — TSH: TSH: 0.892 u[IU]/mL (ref 0.350–4.500)

## 2014-07-05 NOTE — Patient Instructions (Addendum)
Ph[facility] [phone]  We are giving you chantix for smoking cessation. You can do it! And we are here to help! You may have heard some scary side effects about chantix, the three most common I hear about are nausea, crazy dreams and depression.  However, I like for my patients to try to stay on 1/2 a tablet twice a day rather than one tablet twice a day as normally prescribed. This helps decrease the chances of side effects and helps save money by making a one month prescription last two months  Please start the prescription this way:  Start 1/2 tablet by mouth once daily after food with a full glass of water for 3 days Then do 1/2 tablet by mouth twice daily for 4 days.  At this point we have several options: 1) continue on 1/2 tablet twice a day- which I encourage you to do. You can stay on this dose the rest of the time on the medication or if you still feel the need to smoke you can do one of the two options below. 2) do one tablet in the morning and 1/2 in the evening which helps decrease dreams. 3) do one tablet twice a day.   What if I miss a dose? If you miss a dose, take it as soon as you can. If it is almost time for your next dose, take only that dose. Do not take double or extra doses.  What should I watch for while using this medicine? Visit your doctor or health care professional for regular check ups. Ask for ongoing advice and encouragement from your doctor or healthcare professional, friends, and family to help you quit. If you smoke while on this medication, quit again  Your mouth may get dry. Chewing sugarless gum or hard candy, and drinking plenty of water may help. Contact your doctor if the problem does not go away or is severe.  You may get drowsy or dizzy. Do not drive, use machinery, or do anything that needs mental alertness until you know how this medicine affects you. Do not stand or sit up quickly, especially if you are an older patient.   The use of  this medicine may increase the chance of suicidal thoughts or actions. Pay special attention to how you are responding while on this medicine. Any worsening of mood, or thoughts of suicide or dying should be reported to your health care professional right away.  ADVANTAGES OF QUITTING SMOKING  Within 20 minutes, blood pressure decreases. Your pulse is at normal level.  After 8 hours, carbon monoxide levels in the blood return to normal. Your oxygen level increases.  After 24 hours, the chance of having a heart attack starts to decrease. Your breath, hair, and body stop smelling like smoke.  After 48 hours, damaged nerve endings begin to recover. Your sense of taste and smell improve.  After 72 hours, the body is virtually free of nicotine. Your bronchial tubes relax and breathing becomes easier.  After 2 to 12 weeks, lungs can hold more air. Exercise becomes easier and circulation improves.  After 1 year, the risk of coronary heart disease is cut in half.  After 5 years, the risk of stroke falls to the same as a nonsmoker.  After 10 years, the risk of lung cancer is cut in half and the risk of other cancers decreases significantly.  After 15 years, the risk of coronary heart disease drops, usually to the level of a nonsmoker.  You will have extra money to spend on things other than cigarettes.

## 2014-07-05 NOTE — Progress Notes (Signed)
Assessment and Plan:  Hypertension: Continue medication, monitor blood pressure at home. Continue DASH diet. Cholesterol: Continue diet and exercise. Check cholesterol.  Pre-diabetes-Continue diet and exercise. Check A1C Vitamin D Def- check level and continue medications.  ED- try cialis, if this does not work we will refer to urology.   Continue diet and meds as discussed. Further disposition pending results of labs.  HPI 63 y.o. male  presents for 3 month follow up with hypertension, hyperlipidemia, prediabetes and vitamin D. His blood pressure has been controlled at home, today their BP is BP: 118/78 mmHg He does not workout but is very active. He denies chest pain, shortness of breath, dizziness.  He is on cholesterol medication and denies myalgias. His cholesterol is at goal. The cholesterol last visit was:   Lab Results  Component Value Date   CHOL 139 01/18/2014   HDL 51 01/18/2014   LDLCALC 78 01/18/2014   TRIG 49 01/18/2014   CHOLHDL 2.7 01/18/2014   He has been working on diet and exercise for prediabetes, and denies paresthesia of the feet, polydipsia and polyuria. Last A1C in the office was:  Lab Results  Component Value Date   HGBA1C 5.8* 01/18/2014   Patient is on Vitamin D supplement.   Lab Results  Component Value Date   VD25OH 77 10/06/2013     Patient has ED, the viagra 100 does not work for him, he will try cialsis and if this does not work we will send him to a urologist.   Current Medications:  Current Outpatient Prescriptions on File Prior to Visit  Medication Sig Dispense Refill  . atorvastatin (LIPITOR) 20 MG tablet TAKE 1 TABLET DAILY FOR CHOLESTEROL  90 tablet  11  . Avanafil 100 MG TABS 1 po QD/ PRN  12 tablet  3  . Cholecalciferol (VITAMIN D-3) 5000 UNITS TABS Take 5,000 Units by mouth.      . cyclobenzaprine (FLEXERIL) 10 MG tablet Take 1 tablet (10 mg total) by mouth every 8 (eight) hours as needed for muscle spasms.  90 tablet  1  . pantoprazole  (PROTONIX) 40 MG tablet TAKE 1 TABLET TWICE A DAY  180 tablet  3  . sildenafil (VIAGRA) 100 MG tablet Take 1 tablet (100 mg total) by mouth as needed for erectile dysfunction.  20 tablet  1  . sildenafil (VIAGRA) 100 MG tablet Take 1 tablet (100 mg total) by mouth as needed for erectile dysfunction.  12 tablet  1  . tadalafil (CIALIS) 5 MG tablet Take 1 tablet (5 mg total) by mouth daily as needed for erectile dysfunction.  30 tablet  0  . valsartan (DIOVAN) 160 MG tablet Take 160 mg by mouth daily.        Marland Kitchen zolpidem (AMBIEN) 5 MG tablet Take 1 tablet (5 mg total) by mouth at bedtime as needed for sleep.  90 tablet  1   No current facility-administered medications on file prior to visit.   Medical History:  Past Medical History  Diagnosis Date  . Hypertension   . Hyperlipidemia   . Abnormal liver function test   . GERD (gastroesophageal reflux disease)   . Anxiety   . DDD (degenerative disc disease)   . Tobacco dependence   . Insomnia   . Colon polyp   . ED (erectile dysfunction) 04/05/2014   Allergies:  Allergies  Allergen Reactions  . Finasteride      Review of Systems: [X]  = complains of  [ ]  = denies  General: Fatigue [ ]  Fever [ ]  Chills [ ]  Weakness [ ]   Insomnia [ ]  Eyes: Redness [ ]  Blurred vision [ ]  Diplopia [ ]   ENT: Congestion [ ]  Sinus Pain [ ]  Post Nasal Drip [ ]  Sore Throat [ ]  Earache [ ]   Cardiac: Chest pain/pressure [ ]  SOB [ ]  Orthopnea [ ]   Palpitations [ ]   Paroxysmal nocturnal dyspnea[ ]  Claudication [ ]  Edema [ ]   Pulmonary: Cough [ ]  Wheezing[ ]   SOB [ ]   Snoring [ ]   GI: Nausea [ ]  Vomiting[ ]  Dysphagia[ ]  Heartburn[ ]  Abdominal pain [ ]  Constipation [ ] ; Diarrhea [ ] ; BRBPR [ ]  Melena[ ]  GU: Hematuria[ ]  Dysuria [ ]  Nocturia[ ]  Urgency [ ]   Hesitancy [ ]  Discharge [ ]  Neuro: Headaches[ ]  Vertigo[ ]  Paresthesias[ ]  Spasm [ ]  Speech changes [ ]  Incoordination [ ]   Ortho: Arthritis [ ]  Joint pain [ ]  Muscle pain [ ]  Joint swelling [ ]  Back Pain [ ]  Skin:   Rash [ ]   Pruritis [ ]  Change in skin lesion [ ]   Psych: Depression[ ]  Anxiety[ ]  Confusion [ ]  Memory loss [ ]   Heme/Lypmh: Bleeding [ ]  Bruising [ ]  Enlarged lymph nodes [ ]   Endocrine: Visual blurring [ ]  Paresthesia [ ]  Polyuria [ ]  Polydypsea [ ]    Heat/cold intolerance [ ]  Hypoglycemia [ ]   Family history- Review and unchanged Social history- Review and unchanged Physical Exam: BP 118/78  Pulse 72  Temp(Src) 97.7 F (36.5 C)  Resp 16  Ht 6\' 1"  (1.854 m)  Wt 203 lb (92.08 kg)  BMI 26.79 kg/m2 Wt Readings from Last 3 Encounters:  07/05/14 203 lb (92.08 kg)  04/20/14 206 lb (93.441 kg)  04/04/14 204 lb (92.534 kg)   General Appearance: Well nourished, in no apparent distress. Eyes: PERRLA, EOMs, conjunctiva no swelling or erythema Sinuses: No Frontal/maxillary tenderness ENT/Mouth: Ext aud canals clear, TMs without erythema, bulging. No erythema, swelling, or exudate on post pharynx.  Tonsils not swollen or erythematous. Hearing normal.  Neck: Supple, thyroid normal.  Respiratory: Respiratory effort normal, BS equal bilaterally without rales, rhonchi, wheezing or stridor.  Cardio: RRR with no MRGs. Brisk peripheral pulses without edema.  Abdomen: Soft, + BS.  Non tender, no guarding, rebound, hernias, masses. Lymphatics: Non tender without lymphadenopathy.  Musculoskeletal: Full ROM, 5/5 strength, normal gait.  Skin: Warm, dry without rashes, lesions, ecchymosis.  Neuro: Cranial nerves intact. Normal muscle tone, no cerebellar symptoms. Sensation intact.  Psych: Awake and oriented X 3, normal affect, Insight and Judgment appropriate.    Vicie Mutters 9:51 AM

## 2014-07-06 LAB — HEMOGLOBIN A1C
Hgb A1c MFr Bld: 5.7 % — ABNORMAL HIGH (ref <5.7)
Mean Plasma Glucose: 117 mg/dL — ABNORMAL HIGH (ref <117)

## 2014-07-06 LAB — VITAMIN D 25 HYDROXY (VIT D DEFICIENCY, FRACTURES): Vit D, 25-Hydroxy: 85 ng/mL (ref 30–89)

## 2014-08-04 ENCOUNTER — Other Ambulatory Visit: Payer: Self-pay | Admitting: Internal Medicine

## 2014-08-18 ENCOUNTER — Ambulatory Visit (INDEPENDENT_AMBULATORY_CARE_PROVIDER_SITE_OTHER): Payer: BC Managed Care – PPO | Admitting: Emergency Medicine

## 2014-08-18 ENCOUNTER — Encounter: Payer: Self-pay | Admitting: Emergency Medicine

## 2014-08-18 VITALS — BP 118/70 | HR 74 | Temp 97.8°F | Resp 18 | Ht 73.75 in | Wt 208.0 lb

## 2014-08-18 DIAGNOSIS — K047 Periapical abscess without sinus: Secondary | ICD-10-CM

## 2014-08-18 MED ORDER — AMOXICILLIN-POT CLAVULANATE 875-125 MG PO TABS: 1.0000 | ORAL_TABLET | Freq: Two times a day (BID) | ORAL | Status: DC

## 2014-08-18 NOTE — Progress Notes (Signed)
   Subjective:    Patient ID: Arthur Garcia, male    DOB: [birth year], 63 y.o.   MRN: [id number]  HPI Comments: 63 yo WM with concern for dental abscess x 1 day. He will try to see dentist on MON. He noticed cyst/ abscess on left upper gum line when felt fullness with brushing teeth. He denies fever or any new changes.  Mouth Lesions  Associated symptoms include mouth sores. Pertinent negatives include no sore throat.   Current Outpatient Prescriptions on File Prior to Visit  Medication Sig Dispense Refill  . atorvastatin (LIPITOR) 20 MG tablet TAKE 1 TABLET DAILY FOR CHOLESTEROL 90 tablet 11  . Avanafil 100 MG TABS 1 po QD/ PRN 12 tablet 3  . Cholecalciferol (VITAMIN D-3) 5000 UNITS TABS Take 5,000 Units by mouth.    . pantoprazole (PROTONIX) 40 MG tablet TAKE 1 TABLET TWICE A DAY 180 tablet 3  . tadalafil (CIALIS) 5 MG tablet Take 1 tablet (5 mg total) by mouth daily as needed for erectile dysfunction. 30 tablet 0  . valsartan-hydrochlorothiazide (DIOVAN-HCT) 80-12.5 MG per tablet TAKE 1 TABLET DAILY FOR BLOOD PRESSURE 90 tablet 2  . zolpidem (AMBIEN) 5 MG tablet Take 1 tablet (5 mg total) by mouth at bedtime as needed for sleep. 90 tablet 1   No current facility-administered medications on file prior to visit.    Allergies  Allergen Reactions  . Finasteride    Past Medical History  Diagnosis Date  . Hypertension   . Hyperlipidemia   . Abnormal liver function test   . GERD (gastroesophageal reflux disease)   . Anxiety   . DDD (degenerative disc disease)   . Tobacco dependence   . Insomnia   . Colon polyp   . ED (erectile dysfunction) 04/05/2014     Review of Systems  HENT: Positive for dental problem and mouth sores. Negative for sore throat and trouble swallowing.   Respiratory: Negative for chest tightness and shortness of breath.   All other systems reviewed and are negative.  BP 118/70 mmHg  Pulse 74  Temp(Src) 97.8 F (36.6 C) (Temporal)  Resp 18  Ht 6' 1.75"  (1.873 m)  Wt 208 lb (94.348 kg)  BMI 26.89 kg/m2      Objective:   Physical Exam  Constitutional: He appears well-developed.  HENT:  Head: Normocephalic and atraumatic.  Mouth/Throat: Oropharynx is clear and moist. No oropharyngeal exudate.    Neck: Normal range of motion. Neck supple.  Cardiovascular: Normal rate.   Pulmonary/Chest: Effort normal.  Musculoskeletal: Normal range of motion.  Lymphadenopathy:    He has no cervical adenopathy.  Skin: Skin is warm. No rash noted.  Psychiatric: Judgment normal.  Nursing note and vitals reviewed.      Assessment & Plan:  1.? Mouth ulcer vs abscess- Advised warm salt water gargles, take antibiotics AD, F/u Monday with dentist for further evaluation with HX of tobacco use. w/c if SX increase or ER.

## 2014-08-18 NOTE — Patient Instructions (Signed)

## 2014-08-28 ENCOUNTER — Other Ambulatory Visit: Payer: Self-pay | Admitting: Physician Assistant

## 2014-08-28 MED ORDER — TADALAFIL 5 MG PO TABS: 5.0000 mg | ORAL_TABLET | Freq: Every day | ORAL | Status: DC | PRN

## 2014-09-18 ENCOUNTER — Encounter: Payer: Self-pay | Admitting: Cardiovascular Disease

## 2014-09-18 ENCOUNTER — Encounter: Payer: Self-pay | Admitting: Cardiology

## 2014-09-20 MED ORDER — TADALAFIL 5 MG PO TABS: 5.0000 mg | ORAL_TABLET | Freq: Every day | ORAL | Status: DC | PRN

## 2014-09-20 NOTE — Addendum Note (Signed)
Addended by: Vicie Mutters R on: 09/20/2014 01:40 PM   Modules accepted: Orders

## 2014-10-09 ENCOUNTER — Encounter: Payer: Self-pay | Admitting: Emergency Medicine

## 2014-10-11 ENCOUNTER — Encounter: Payer: Self-pay | Admitting: Emergency Medicine

## 2014-10-22 ENCOUNTER — Encounter: Payer: Self-pay | Admitting: Physician Assistant

## 2014-10-23 ENCOUNTER — Other Ambulatory Visit: Payer: Self-pay | Admitting: *Deleted

## 2014-10-23 ENCOUNTER — Other Ambulatory Visit: Payer: Self-pay | Admitting: Internal Medicine

## 2014-10-23 DIAGNOSIS — G4700 Insomnia, unspecified: Secondary | ICD-10-CM

## 2014-10-23 MED ORDER — ZOLPIDEM TARTRATE 5 MG PO TABS: 5.0000 mg | ORAL_TABLET | Freq: Every evening | ORAL | Status: DC | PRN

## 2014-10-23 MED ORDER — ZOLPIDEM TARTRATE 5 MG PO TABS
5.0000 mg | ORAL_TABLET | Freq: Every evening | ORAL | Status: DC | PRN
Start: 2014-10-23 — End: 2014-10-23

## 2014-11-10 ENCOUNTER — Encounter: Payer: Self-pay | Admitting: Emergency Medicine

## 2015-01-01 ENCOUNTER — Ambulatory Visit (INDEPENDENT_AMBULATORY_CARE_PROVIDER_SITE_OTHER): Payer: BLUE CROSS/BLUE SHIELD | Admitting: Internal Medicine

## 2015-01-01 ENCOUNTER — Encounter: Payer: Self-pay | Admitting: Internal Medicine

## 2015-01-01 VITALS — BP 160/88 | HR 68 | Temp 97.7°F | Resp 16 | Ht 73.0 in | Wt 208.8 lb

## 2015-01-01 DIAGNOSIS — M5441 Lumbago with sciatica, right side: Secondary | ICD-10-CM

## 2015-01-01 MED ORDER — TRAMADOL HCL 50 MG PO TABS: 50.0000 mg | ORAL_TABLET | Freq: Four times a day (QID) | ORAL | Status: DC | PRN

## 2015-01-01 MED ORDER — MELOXICAM 15 MG PO TABS: 15.0000 mg | ORAL_TABLET | Freq: Every day | ORAL | Status: DC

## 2015-01-01 MED ORDER — PREDNISONE 10 MG PO TABS: ORAL_TABLET | ORAL | Status: DC

## 2015-01-01 NOTE — Patient Instructions (Signed)
Sciatica Sciatica is pain, weakness, numbness, or tingling along the path of the sciatic nerve. The nerve starts in the lower back and runs down the back of each leg. The nerve controls the muscles in the lower leg and in the back of the knee, while also providing sensation to the back of the thigh, lower leg, and the sole of your foot. Sciatica is a symptom of another medical condition. For instance, nerve damage or certain conditions, such as a herniated disk or bone spur on the spine, pinch or put pressure on the sciatic nerve. This causes the pain, weakness, or other sensations normally associated with sciatica. Generally, sciatica only affects one side of the body. CAUSES   Herniated or slipped disc.  Degenerative disk disease.  A pain disorder involving the narrow muscle in the buttocks (piriformis syndrome).  Pelvic injury or fracture.  Pregnancy.  Tumor (rare). SYMPTOMS  Symptoms can vary from mild to very severe. The symptoms usually travel from the low back to the buttocks and down the back of the leg. Symptoms can include:  Mild tingling or dull aches in the lower back, leg, or hip.  Numbness in the back of the calf or sole of the foot.  Burning sensations in the lower back, leg, or hip.  Sharp pains in the lower back, leg, or hip.  Leg weakness.  Severe back pain inhibiting movement. These symptoms may get worse with coughing, sneezing, laughing, or prolonged sitting or standing. Also, being overweight may worsen symptoms. DIAGNOSIS  Your caregiver will perform a physical exam to look for common symptoms of sciatica. He or she may ask you to do certain movements or activities that would trigger sciatic nerve pain. Other tests may be performed to find the cause of the sciatica. These may include:  Blood tests.  X-rays.  Imaging tests, such as an MRI or CT scan. TREATMENT  Treatment is directed at the cause of the sciatic pain. Sometimes, treatment is not necessary  and the pain and discomfort goes away on its own. If treatment is needed, your caregiver may suggest:  Over-the-counter medicines to relieve pain.  Prescription medicines, such as anti-inflammatory medicine, muscle relaxants, or narcotics.  Applying heat or ice to the painful area.  Steroid injections to lessen pain, irritation, and inflammation around the nerve.  Reducing activity during periods of pain.  Exercising and stretching to strengthen your abdomen and improve flexibility of your spine. Your caregiver may suggest losing weight if the extra weight makes the back pain worse.  Physical therapy.  Surgery to eliminate what is pressing or pinching the nerve, such as a bone spur or part of a herniated disk. HOME CARE INSTRUCTIONS   Only take over-the-counter or prescription medicines for pain or discomfort as directed by your caregiver.  Apply ice to the affected area for 20 minutes, 3-4 times a day for the first 48-72 hours. Then try heat in the same way.  Exercise, stretch, or perform your usual activities if these do not aggravate your pain.  Attend physical therapy sessions as directed by your caregiver.  Keep all follow-up appointments as directed by your caregiver.  Do not wear high heels or shoes that do not provide proper support.  Check your mattress to see if it is too soft. A firm mattress may lessen your pain and discomfort. SEEK IMMEDIATE MEDICAL CARE IF:   You lose control of your bowel or bladder (incontinence).  You have increasing weakness in the lower back, pelvis, buttocks,   or legs.  You have redness or swelling of your back.  You have a burning sensation when you urinate.  You have pain that gets worse when you lie down or awakens you at night.  Your pain is worse than you have experienced in the past.  Your pain is lasting longer than 4 weeks.  You are suddenly losing weight without reason. MAKE SURE YOU:  Understand these  instructions.  Will watch your condition.  Will get help right away if you are not doing well or get worse. Document Released: 09/09/2001 Document Revised: 03/16/2012 Document Reviewed: 01/25/2012 [employer] Patient Information 2015 [employer]. This information is not intended to replace advice given to you by your health care provider. Make sure you discuss any questions you have with your health care provider.  

## 2015-01-01 NOTE — Progress Notes (Signed)
Subjective:    Patient ID: Arthur Garcia, male    DOB: [birth year], 64 y.o.   MRN: [id number]  Back Pain Pertinent negatives include no abdominal pain, chest pain, dysuria or fever.  Patient presents to the office for evaluation of back pain x 4 days.  Patient states that approximately 1 week ago he pulled a muscle and then a few days later he started experiencing an electric shock down the right leg to the back of the right calf.  Patient does have a history of this and has had epidural injections and usually does not get relief from this medication.  He reports that he has been taking 2 aleve every 12 hours.  He reports that this does not help at all.  He reports that this pain is similar to previous attacks of sciatica. He reports no fevers, loss of bowel or bladder, no saddle anestesia, no osteoporosis, no IV drug use, no cancer history.      Review of Systems  Constitutional: Negative for fever, chills and fatigue.  Respiratory: Negative for chest tightness and shortness of breath.   Cardiovascular: Negative for chest pain.  Gastrointestinal: Negative for vomiting, abdominal pain, diarrhea, constipation and rectal pain.  Genitourinary: Negative for dysuria, urgency, frequency, hematuria and difficulty urinating.  Musculoskeletal: Positive for back pain. Negative for myalgias, arthralgias and gait problem.       Objective:   Physical Exam  Constitutional: He is oriented to person, place, and time. He appears well-developed and well-nourished. No distress.  HENT:  Head: Normocephalic and atraumatic.  Mouth/Throat: Oropharynx is clear and moist. No oropharyngeal exudate.  Eyes: Conjunctivae and EOM are normal. Pupils are equal, round, and reactive to light. No scleral icterus.  Neck: Normal range of motion. Neck supple. No JVD present. No thyromegaly present.  Cardiovascular: Normal rate, regular rhythm, normal heart sounds and intact distal pulses.  Exam reveals no gallop and no  friction rub.   No murmur heard. Pulmonary/Chest: Effort normal and breath sounds normal. No respiratory distress. He has no wheezes. He has no rales. He exhibits no tenderness.  Abdominal: Soft. Bowel sounds are normal. He exhibits no distension and no mass. There is no tenderness. There is no rebound and no guarding.  Musculoskeletal: Normal range of motion.  Patient rises slowly from sitting to standing.  They walk without an antalgic gait.  There is no evidence of erythema, ecchymosis, or gross deformity.  There is no tenderness to palpation over bony spine, lumbar paraspinal muscles or buttocks.  Active ROM is limited due to pain.  Sensation to light touch is intact over all extremities.  Strength is symmetric and equal in all extremities.     Lymphadenopathy:    He has no cervical adenopathy.  Neurological: He is alert and oriented to person, place, and time. He has normal strength. No cranial nerve deficit or sensory deficit. Coordination normal.  Skin: Skin is warm and dry. He is not diaphoretic.  Psychiatric: He has a normal mood and affect. His behavior is normal. Judgment and thought content normal.  Nursing note and vitals reviewed.         Assessment & Plan:    1. Low back pain with right-sided sciatica, unspecified back pain laterality History and physical  Consistent with sciatica.  NO evidence of cauda equina.  No neuro deficits.  Try prednisone, and tramadol and then start on daily mobic once prednisone course completed.  Will refer back to neurosurgery if no better after course  is completed.    - predniSONE (DELTASONE) 10 MG tablet; Day 1 take 6 pills,Day 2 take 5 pills, Day 3 take 4 pills, Day 4 take 3 pills, Day 5 take 2 pills,Day 6 take 1 pill  Dispense: 21 tablet; Refill: 0 - traMADol (ULTRAM) 50 MG tablet; Take 1 tablet (50 mg total) by mouth every 6 (six) hours as needed.  Dispense: 30 tablet; Refill: 0 - meloxicam (MOBIC) 15 MG tablet; Take 1 tablet (15 mg total)  by mouth daily.  Dispense: 30 tablet; Refill: 2

## 2015-01-02 ENCOUNTER — Other Ambulatory Visit: Payer: Self-pay | Admitting: *Deleted

## 2015-01-02 DIAGNOSIS — M5441 Lumbago with sciatica, right side: Secondary | ICD-10-CM

## 2015-01-02 MED ORDER — PREDNISONE 10 MG PO TABS: ORAL_TABLET | ORAL | Status: AC

## 2015-01-09 ENCOUNTER — Encounter: Payer: Self-pay | Admitting: Emergency Medicine

## 2015-01-09 ENCOUNTER — Ambulatory Visit (INDEPENDENT_AMBULATORY_CARE_PROVIDER_SITE_OTHER): Payer: BLUE CROSS/BLUE SHIELD | Admitting: Emergency Medicine

## 2015-01-09 VITALS — BP 156/84 | HR 86 | Temp 98.6°F | Resp 16 | Ht 73.75 in | Wt 209.0 lb

## 2015-01-09 DIAGNOSIS — Z0001 Encounter for general adult medical examination with abnormal findings: Secondary | ICD-10-CM

## 2015-01-09 DIAGNOSIS — R6889 Other general symptoms and signs: Secondary | ICD-10-CM

## 2015-01-09 DIAGNOSIS — B351 Tinea unguium: Secondary | ICD-10-CM

## 2015-01-09 DIAGNOSIS — Z0000 Encounter for general adult medical examination without abnormal findings: Secondary | ICD-10-CM

## 2015-01-09 DIAGNOSIS — R739 Hyperglycemia, unspecified: Secondary | ICD-10-CM

## 2015-01-09 DIAGNOSIS — I10 Essential (primary) hypertension: Secondary | ICD-10-CM

## 2015-01-09 DIAGNOSIS — R5381 Other malaise: Secondary | ICD-10-CM

## 2015-01-09 DIAGNOSIS — R5383 Other fatigue: Secondary | ICD-10-CM

## 2015-01-09 DIAGNOSIS — Z79899 Other long term (current) drug therapy: Secondary | ICD-10-CM

## 2015-01-09 DIAGNOSIS — M5441 Lumbago with sciatica, right side: Secondary | ICD-10-CM

## 2015-01-09 DIAGNOSIS — N529 Male erectile dysfunction, unspecified: Secondary | ICD-10-CM

## 2015-01-09 DIAGNOSIS — Z86010 Personal history of colonic polyps: Secondary | ICD-10-CM

## 2015-01-09 DIAGNOSIS — N400 Enlarged prostate without lower urinary tract symptoms: Secondary | ICD-10-CM

## 2015-01-09 DIAGNOSIS — Z125 Encounter for screening for malignant neoplasm of prostate: Secondary | ICD-10-CM

## 2015-01-09 DIAGNOSIS — E559 Vitamin D deficiency, unspecified: Secondary | ICD-10-CM

## 2015-01-09 DIAGNOSIS — E782 Mixed hyperlipidemia: Secondary | ICD-10-CM

## 2015-01-09 LAB — URINALYSIS, ROUTINE W REFLEX MICROSCOPIC
Bilirubin Urine: NEGATIVE
Glucose, UA: NEGATIVE mg/dL
Hgb urine dipstick: NEGATIVE
Ketones, ur: NEGATIVE mg/dL
Leukocytes, UA: NEGATIVE
Nitrite: NEGATIVE
Protein, ur: NEGATIVE mg/dL
Specific Gravity, Urine: 1.01 (ref 1.005–1.030)
Urobilinogen, UA: 0.2 mg/dL (ref 0.0–1.0)
pH: 5.5 (ref 5.0–8.0)

## 2015-01-09 LAB — CBC WITH DIFFERENTIAL/PLATELET
Basophils Absolute: 0.1 10*3/uL (ref 0.0–0.1)
Basophils Relative: 1 % (ref 0–1)
Eosinophils Absolute: 0.3 10*3/uL (ref 0.0–0.7)
Eosinophils Relative: 3 % (ref 0–5)
HCT: 46.4 % (ref 39.0–52.0)
Hemoglobin: 16.8 g/dL (ref 13.0–17.0)
Lymphocytes Relative: 20 % (ref 12–46)
Lymphs Abs: 1.7 10*3/uL (ref 0.7–4.0)
MCH: 31.3 pg (ref 26.0–34.0)
MCHC: 36.2 g/dL — ABNORMAL HIGH (ref 30.0–36.0)
MCV: 86.4 fL (ref 78.0–100.0)
MPV: 9.6 fL (ref 8.6–12.4)
Monocytes Absolute: 0.6 10*3/uL (ref 0.1–1.0)
Monocytes Relative: 7 % (ref 3–12)
Neutro Abs: 5.9 10*3/uL (ref 1.7–7.7)
Neutrophils Relative %: 69 % (ref 43–77)
Platelets: 268 10*3/uL (ref 150–400)
RBC: 5.37 MIL/uL (ref 4.22–5.81)
RDW: 13.2 % (ref 11.5–15.5)
WBC: 8.6 10*3/uL (ref 4.0–10.5)

## 2015-01-09 LAB — HEMOGLOBIN A1C
Hgb A1c MFr Bld: 5.9 % — ABNORMAL HIGH (ref <5.7)
Mean Plasma Glucose: 123 mg/dL — ABNORMAL HIGH (ref <117)

## 2015-01-09 MED ORDER — CICLOPIROX 8 % EX SOLN: Freq: Every day | CUTANEOUS | Status: DC

## 2015-01-09 MED ORDER — TADALAFIL 5 MG PO TABS: 5.0000 mg | ORAL_TABLET | Freq: Every day | ORAL | Status: DC | PRN

## 2015-01-09 NOTE — Patient Instructions (Signed)
FYI   Prostate Cancer The prostate is a male gland that helps produce semen. Prostate cancer is the abnormal growth of cells in this gland. HOME CARE  Only take medicines as told by your doctor.  Eat a healthy diet.  Get plenty of sleep.  Consider joining a support group.  Seek advice to help you manage treatment side effects.  Keep all follow-up visits as told by your doctor.  Tell your cancer specialist if you are admitted to the hospital.  Touch, hold, hug, and caress your partner to continue to show sexual feelings. GET HELP IF:  You have trouble peeing (urinating).  You have blood in your pee.  You have trouble having an erection.  You have pain in your hips, back, or chest. GET HELP RIGHT AWAY IF:  You have weakness or loss of feeling (numbness) in your legs.  You have accidental loss of pee or poop (stool). Document Released: 09/03/2009 Document Revised: 01/30/2014 Document Reviewed: 03/04/2013 [facility] Patient Information 2015 [place], [place]. This information is not intended to replace advice given to you by your health care provider. Make sure you discuss any questions you have with your health care provider.

## 2015-01-09 NOTE — Progress Notes (Signed)
Subjective:    Patient ID: Arthur Garcia, male    DOB: 1951/01/15, 64 y.o.   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 Deficient. He has not been able to keep active with recent back strain. He is not eating healthy. He does take RX AD. He notes BP is up with back pain as well. He is still smoking and has 1-3 beers QHS. Denies CV symptoms.  Cialis 5 mg helps with erection/ prostate but he notes insurance has not provided #30 pills only 18. He notes when uses daily decreased prostate symptoms with incomplete emptying and weak flow.   Back pain has not improved with PRED DP, Tramadol, rest. He initially injured after strain with turning awkwardly fishing. He notes pain is worse with ROM but constant even with rest. Pain radiates down right leg and he would like referral to Dr Vertell Limber.   He would like RX for thick toe nail fungus. He has tried multiple topical OTC fungals and home remedies without improvement.   He did not f/u AD for colonoscopy and is overdue more than 2 years for recheck for polyps. Denies GI symptoms.   He has been mildly fatigued with back pain and recent stress with loss of father but notes stress is improving.    Lab Results      Component                Value               Date                      WBC                      4.7                 07/05/2014                HGB                      15.1                07/05/2014                HCT                      43.7                07/05/2014                PLT                      241                 07/05/2014                GLUCOSE                  90                  07/05/2014                CHOL                     103  07/05/2014                TRIG                     47                  07/05/2014                HDL                      40                  07/05/2014                LDLCALC                  54                   07/05/2014                ALT                      14                  07/05/2014                AST                      15                  07/05/2014                NA                       138                 07/05/2014                K                        4.1                 07/05/2014                CL                       101                 07/05/2014                CREATININE               1.05                07/05/2014                BUN                      16                  07/05/2014                CO2                      26  07/05/2014                TSH                      0.892               07/05/2014                PSA                      2.06                10/06/2013                HGBA1C                   5.7*                07/05/2014                MICROALBUR               0.50                10/06/2013                 Medication List       This list is accurate as of: 01/09/15  5:40 PM.  Always use your most recent med list.               atorvastatin 20 MG tablet  Commonly known as:  LIPITOR  TAKE 1 TABLET DAILY FOR CHOLESTEROL     ciclopirox 8 % solution  Commonly known as:  PENLAC  Apply topically at bedtime. Apply over nail and surrounding skin. Apply daily over previous coat. After seven (7) days, may remove with alcohol and continue cycle.     meloxicam 15 MG tablet  Commonly known as:  MOBIC  Take 1 tablet (15 mg total) by mouth daily.     pantoprazole 40 MG tablet  Commonly known as:  PROTONIX  TAKE 1 TABLET TWICE A DAY     tadalafil 5 MG tablet  Commonly known as:  CIALIS  Take 1 tablet (5 mg total) by mouth daily as needed for erectile dysfunction.     traMADol 50 MG tablet  Commonly known as:  ULTRAM  Take 1 tablet (50 mg total) by mouth every 6 (six) hours as needed.     valsartan-hydrochlorothiazide 80-12.5 MG per tablet  Commonly known as:  DIOVAN-HCT  TAKE 1 TABLET DAILY FOR BLOOD PRESSURE     Vitamin  D-3 5000 UNITS Tabs  Take 5,000 Units by mouth.     zolpidem 5 MG tablet  Commonly known as:  AMBIEN  Take 1 tablet (5 mg total) by mouth at bedtime as needed for sleep.       Allergies  Allergen Reactions  . Finasteride    Past Medical History  Diagnosis Date  . Hypertension   . Hyperlipidemia   . Abnormal liver function test   . GERD (gastroesophageal reflux disease)   . Anxiety   . DDD (degenerative disc disease)   . Tobacco dependence   . Insomnia   . Colon polyp   . ED (erectile dysfunction) 04/05/2014   Past Surgical History  Procedure Laterality Date  . Uvuectomy    . Appendectomy    . Vasectomy    . Adenoidectomy      age 34  History  Substance Use Topics  . Smoking status: Current Every Day Smoker -- 1.00 packs/day  . Smokeless tobacco: Not on file  . Alcohol Use: 9.0 oz/week    15 Cans of beer per week   Family History  Problem Relation Age of Onset  . Thyroid disease Mother   . Hypertension Mother   . Osteoporosis Mother   . Glaucoma Mother   . Thyroid disease Father   . Cancer Father   . Hypertension Father   . Hyperlipidemia Father   . Heart disease Father   . Heart disease Paternal Grandfather      MAINTENANCE: Colonoscopy:2008 POLYPs OVERDUE SINCE 2013 NLZ:JQBHALPF 2015 Dentist:Q 92month Stress test 2012 WNL   IMMUNIZATIONS: Td:2008 Pneumovax 23: 2014 Zostavax:n/a Influenza:2015  Patient Care Team: Unk Pinto, MD as PCP - General (Internal Medicine) Erline Levine, MD as Consulting Physician (Neurosurgery) Ladene Artist, MD as Consulting Physician (Gastroenterology) Sharyne Peach, MD as Consulting Physician (Ophthalmology) Minus Breeding, MD as Consulting Physician (Cardiology) Lowella Bandy, MD as Consulting Physician (Urology) Druscilla Brownie, MD as Consulting Physician (Dermatology) Lavone Neri, (Dentist)  Review of Systems  Constitutional: Positive for fatigue.  Respiratory: Negative for shortness of breath.     Cardiovascular: Negative for chest pain.  Musculoskeletal: Positive for back pain.  Skin: Positive for color change.  Neurological: Negative for numbness.  Psychiatric/Behavioral: Negative for suicidal ideas. The patient is not nervous/anxious.   All other systems reviewed and are negative.  BP 156/84 mmHg  Pulse 86  Temp(Src) 98.6 F (37 C) (Temporal)  Resp 16  Ht 6' 1.75" (1.873 m)  Wt 209 lb (94.802 kg)  BMI 27.02 kg/m2     Objective:   Physical Exam  Constitutional: He is oriented to person, place, and time. He appears well-developed and well-nourished.  HENT:  Head: Normocephalic and atraumatic.  Right Ear: External ear normal.  Left Ear: External ear normal.  Nose: Nose normal.  Mouth/Throat: Oropharynx is clear and moist.  Eyes: Conjunctivae and EOM are normal. Pupils are equal, round, and reactive to light. Right eye exhibits no discharge. Left eye exhibits no discharge. No scleral icterus.  Neck: Normal range of motion. Neck supple. No JVD present. No tracheal deviation present. No thyromegaly present.  Cardiovascular: Normal rate, regular rhythm, normal heart sounds and intact distal pulses.   Pulmonary/Chest: Effort normal and breath sounds normal.  Abdominal: Soft. Bowel sounds are normal. He exhibits no distension and no mass. There is no tenderness. There is no rebound and no guarding.  Genitourinary: Rectum normal. Guaiac negative stool.  Mildly enlarged prostate, difficulty to fully examine due to patient discomfort.  Musculoskeletal: Normal range of motion. He exhibits no edema or tenderness.       Back:  Lymphadenopathy:    He has no cervical adenopathy.  Neurological: He is alert and oriented to person, place, and time. He has normal reflexes. No cranial nerve deficit. He exhibits normal muscle tone. Coordination normal.  Skin: Skin is warm and dry. No rash noted.     Exposed area needs full at [facility]. Dry Skin bilateral hands  Psychiatric: He has a  normal mood and affect. His behavior is normal. Judgment and thought content normal.  Nursing note and vitals reviewed.    AORTA SCAN WNL EKG New RBBB Assessment & Plan:  1. CPE- Update screening labs/ History/ Immunizations/ Testing as needed. Advised healthy diet, QD exercise, increase H20 and continue RX/ Vitamins AD. OVERDUE COLON will schedule  2. EKG change with tobacco/ Cholesterol  history- REFER DR [facility] for re-eval  3. BPH/ ED-Cialis 5 mg for BPH and erectile dysfunction RX sent to mail order, may need prior auth for daily use. SX 20 mg #3 given AD.  4. 3 month F/U for HTN, Cholesterol, Pre-Dm, D. Deficient. Needs healthy diet, cardio QD and obtain healthy weight. Check Labs, Check BP if >130/80 call office   5. Recurrent back pain- REFER ORTHO, Continue RX AD, limit lifting  6. TOE nail fungus- RX AD if no change podiatry. Hygiene advised  7. Fatigue- check labs, increase activity and H2O   8. DRY skin on hands- limit exposures, overnight lubrication discussed

## 2015-01-10 LAB — BASIC METABOLIC PANEL WITH GFR
BUN: 17 mg/dL (ref 6–23)
CO2: 27 mEq/L (ref 19–32)
Calcium: 9.6 mg/dL (ref 8.4–10.5)
Chloride: 99 mEq/L (ref 96–112)
Creat: 0.98 mg/dL (ref 0.50–1.35)
GFR, Est African American: >89 mL/min
GFR, Est Non African American: 81 mL/min
Glucose, Bld: 101 mg/dL — ABNORMAL HIGH (ref 70–99)
Potassium: 4.2 mEq/L (ref 3.5–5.3)
Sodium: 136 mEq/L (ref 135–145)

## 2015-01-10 LAB — HEPATIC FUNCTION PANEL
ALT: 14 U/L (ref 0–53)
AST: 14 U/L (ref 0–37)
Albumin: 4.4 g/dL (ref 3.5–5.2)
Alkaline Phosphatase: 64 U/L (ref 39–117)
Bilirubin, Direct: 0.1 mg/dL (ref 0.0–0.3)
Indirect Bilirubin: 0.5 mg/dL (ref 0.2–1.2)
Total Bilirubin: 0.6 mg/dL (ref 0.2–1.2)
Total Protein: 6.9 g/dL (ref 6.0–8.3)

## 2015-01-10 LAB — MICROALBUMIN / CREATININE URINE RATIO
Creatinine, Urine: 67.2 mg/dL
Microalb Creat Ratio: 13.4 mg/g (ref 0.0–30.0)
Microalb, Ur: 0.9 mg/dL (ref <2.0)

## 2015-01-10 LAB — LIPID PANEL
Cholesterol: 145 mg/dL (ref 0–200)
HDL: 51 mg/dL (ref >=40)
LDL Cholesterol: 65 mg/dL (ref 0–99)
Total CHOL/HDL Ratio: 2.8 Ratio
Triglycerides: 145 mg/dL (ref <150)
VLDL: 29 mg/dL (ref 0–40)

## 2015-01-10 LAB — PSA: PSA: 2.75 ng/mL (ref <=4.00)

## 2015-01-10 LAB — VITAMIN D 25 HYDROXY (VIT D DEFICIENCY, FRACTURES): Vit D, 25-Hydroxy: 57 ng/mL (ref 30–100)

## 2015-01-10 LAB — TSH: TSH: 0.716 u[IU]/mL (ref 0.350–4.500)

## 2015-01-10 LAB — INSULIN, FASTING: Insulin fasting, serum: 5.2 u[IU]/mL (ref 2.0–19.6)

## 2015-01-10 LAB — MAGNESIUM: Magnesium: 1.7 mg/dL (ref 1.5–2.5)

## 2015-01-16 ENCOUNTER — Encounter: Payer: Self-pay | Admitting: *Deleted

## 2015-01-16 ENCOUNTER — Other Ambulatory Visit: Payer: Self-pay | Admitting: *Deleted

## 2015-01-16 DIAGNOSIS — M5431 Sciatica, right side: Secondary | ICD-10-CM

## 2015-01-22 ENCOUNTER — Encounter: Payer: Self-pay | Admitting: Internal Medicine

## 2015-01-23 ENCOUNTER — Encounter: Payer: Self-pay | Admitting: *Deleted

## 2015-01-25 ENCOUNTER — Encounter: Payer: Self-pay | Admitting: Internal Medicine

## 2015-01-25 ENCOUNTER — Other Ambulatory Visit: Payer: Self-pay | Admitting: Internal Medicine

## 2015-01-25 DIAGNOSIS — M542 Cervicalgia: Secondary | ICD-10-CM

## 2015-01-25 DIAGNOSIS — M5440 Lumbago with sciatica, unspecified side: Secondary | ICD-10-CM

## 2015-01-25 NOTE — Progress Notes (Signed)
Obtained pre auth on both mri scans with [employer].  Called and left patient vm to call GI [phone] to schedule ASAP.    Thank you, Katrina Judeth Horn [facility] [phone] Fax [phone]

## 2015-02-04 ENCOUNTER — Ambulatory Visit
Admission: RE | Admit: 2015-02-04 | Discharge: 2015-02-04 | Disposition: A | Discharge status: Home / Self Care | Payer: BLUE CROSS/BLUE SHIELD | Source: Ambulatory Visit | Attending: Internal Medicine | Admitting: Internal Medicine

## 2015-02-04 DIAGNOSIS — M542 Cervicalgia: Secondary | ICD-10-CM

## 2015-02-04 DIAGNOSIS — M5440 Lumbago with sciatica, unspecified side: Secondary | ICD-10-CM

## 2015-02-07 ENCOUNTER — Telehealth: Payer: Self-pay | Admitting: *Deleted

## 2015-02-07 ENCOUNTER — Other Ambulatory Visit: Payer: Self-pay | Admitting: *Deleted

## 2015-02-07 DIAGNOSIS — M5441 Lumbago with sciatica, right side: Secondary | ICD-10-CM

## 2015-02-07 DIAGNOSIS — G4700 Insomnia, unspecified: Secondary | ICD-10-CM

## 2015-02-07 MED ORDER — ZOLPIDEM TARTRATE 5 MG PO TABS: 5.0000 mg | ORAL_TABLET | Freq: Every evening | ORAL | Status: DC | PRN

## 2015-02-07 MED ORDER — MELOXICAM 15 MG PO TABS: 15.0000 mg | ORAL_TABLET | Freq: Every day | ORAL | Status: DC

## 2015-02-07 MED ORDER — TRAMADOL HCL 50 MG PO TABS: 50.0000 mg | ORAL_TABLET | Freq: Four times a day (QID) | ORAL | Status: DC | PRN

## 2015-02-07 NOTE — Telephone Encounter (Signed)
RX for Ambien was sent to [facility] but patient called back and said he would get RX through [facility].  RX at [facility] cancelled.

## 2015-02-15 ENCOUNTER — Encounter: Payer: Self-pay | Admitting: *Deleted

## 2015-02-27 ENCOUNTER — Ambulatory Visit: Payer: Self-pay | Admitting: Cardiology

## 2015-04-04 ENCOUNTER — Other Ambulatory Visit: Payer: Self-pay | Admitting: Internal Medicine

## 2015-04-11 ENCOUNTER — Encounter: Payer: Self-pay | Admitting: Cardiology

## 2015-04-11 ENCOUNTER — Ambulatory Visit (INDEPENDENT_AMBULATORY_CARE_PROVIDER_SITE_OTHER): Payer: BLUE CROSS/BLUE SHIELD | Admitting: Cardiology

## 2015-04-11 VITALS — BP 122/82 | HR 78 | Ht 74.0 in | Wt 206.0 lb

## 2015-04-11 DIAGNOSIS — I452 Bifascicular block: Secondary | ICD-10-CM | POA: Insufficient documentation

## 2015-04-11 DIAGNOSIS — R9431 Abnormal electrocardiogram [ECG] [EKG]: Secondary | ICD-10-CM

## 2015-04-11 NOTE — Patient Instructions (Signed)
Your physician wants you to follow-up in: 1 Year. You will receive a reminder letter in the mail two months in advance. If you don't receive a letter, please call our office to schedule the follow-up appointment.  

## 2015-04-11 NOTE — Progress Notes (Signed)
Cardiology Office Note   Date:  04/11/2015   ID:  AQUARIUS LATOUCHE, DOB [birth year], MRN [id number]  PCP:  Alesia Richards, MD  Cardiologist:   Minus Breeding, MD   Chief Complaint  Patient presents with  . Establish Care    Patient has no complaints.      History of Present Illness: Arthur Garcia is a 64 y.o. male who presents for evaluation of an abnormal EKG. I saw him in 2012 for evaluation of cardiovascular risk factors. He had a negative exercise treadmill test. He did review the EKG from back and it was borderline incomplete right bundle branch block. He is now presenting for evaluation of a complete bundle branch block. He has had no cardiovascular symptoms. He is active. 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 With significant physical activity he denies any cardiovascular symptoms. The patient denies any new symptoms such as chest discomfort, neck or arm discomfort. There has been no new shortness of breath, PND or orthopnea. There have been no reported palpitations, presyncope or syncope.  He does still smoke cigarettes unfortunately.  Past Medical History  Diagnosis Date  . Hypertension   . Hyperlipidemia   . Abnormal liver function test   . GERD (gastroesophageal reflux disease)   . Anxiety   . DDD (degenerative disc disease)   . Tobacco dependence   . Insomnia   . Colon polyp   . ED (erectile dysfunction) 04/05/2014    Past Surgical History  Procedure Laterality Date  . Uvuectomy    . Appendectomy    . Vasectomy    . Adenoidectomy      age 41     Current Outpatient Prescriptions  Medication Sig Dispense Refill  . atorvastatin (LIPITOR) 20 MG tablet TAKE 1 TABLET DAILY FOR CHOLESTEROL 90 tablet 11  . Cholecalciferol (VITAMIN D-3) 5000 UNITS TABS Take 5,000 Units by mouth.    . pantoprazole (PROTONIX) 40 MG tablet TAKE 1 TABLET TWICE A DAY 180 tablet 2  . pregabalin (LYRICA) 75 MG capsule Take 75 mg by mouth 2  (two) times daily.    . tadalafil (CIALIS) 5 MG tablet Take 1 tablet (5 mg total) by mouth daily as needed for erectile dysfunction. 90 tablet 1  . valsartan-hydrochlorothiazide (DIOVAN-HCT) 80-12.5 MG per tablet TAKE 1 TABLET DAILY FOR BLOOD PRESSURE 90 tablet 2  . zolpidem (AMBIEN) 5 MG tablet Take 1 tablet (5 mg total) by mouth at bedtime as needed for sleep. 30 tablet 2   No current facility-administered medications for this visit.    Allergies:   Review of patient's allergies indicates no known allergies.    Social History:  The patient  reports that he has been smoking.  He does not have any smokeless tobacco history on file. He reports that he drinks about 9.0 oz of alcohol per week. He reports that he does not use illicit drugs.   Family History:  The patient's family history includes Cancer in his father; Glaucoma in his mother; Heart disease in his father and paternal grandfather; Hyperlipidemia in his father; Hypertension in his father and mother; Osteoporosis in his mother; Thyroid disease in his father and mother.    ROS:  Please see the history of present illness.   Otherwise, review of systems are positive for none.   All other systems are reviewed and negative.    PHYSICAL EXAM: VS:  BP 122/82 mmHg  Pulse 78  Ht 6'  2" (1.88 m)  Wt 206 lb (93.441 kg)  BMI 26.44 kg/m2 , BMI Body mass index is 26.44 kg/(m^2). GENERAL:  Well appearing HEENT:  Pupils equal round and reactive, fundi not visualized, oral mucosa unremarkable NECK:  No jugular venous distention, waveform within normal limits, carotid upstroke brisk and symmetric, no bruits, no thyromegaly LYMPHATICS:  No cervical, inguinal adenopathy LUNGS:  Clear to auscultation bilaterally BACK:  No CVA tenderness CHEST:  Unremarkable HEART:  PMI not displaced or sustained,S1 and S2 within normal limits, no S3, no S4, no clicks, no rubs, no murmurs ABD:  Flat, positive bowel sounds normal in frequency in pitch, no bruits, no  rebound, no guarding, no midline pulsatile mass, no hepatomegaly, no splenomegaly EXT:  2 plus pulses throughout, no edema, no cyanosis no clubbing SKIN:  No rashes no nodules NEURO:  Cranial nerves II through XII grossly intact, motor grossly intact throughout PSYCH:  Cognitively intact, oriented to person place and time    EKG:  EKG is not ordered today. The ekg ordered 01/09/15 demonstrates NSR, rate 86, LAFB, RBBB, no acute ST T wave changes.    Recent Labs: 01/09/2015: ALT 14; BUN 17; Creat 0.98; Hemoglobin 16.8; Magnesium 1.7; Platelets 268; Potassium 4.2; Sodium 136; TSH 0.716    Lipid Panel    Component Value Date/Time   CHOL 145 01/09/2015 1208   TRIG 145 01/09/2015 1208   HDL 51 01/09/2015 1208   CHOLHDL 2.8 01/09/2015 1208   VLDL 29 01/09/2015 1208   LDLCALC 65 01/09/2015 1208      Wt Readings from Last 3 Encounters:  04/11/15 206 lb (93.441 kg)  01/09/15 209 lb (94.802 kg)  01/01/15 208 lb 12.8 oz (94.711 kg)      Other studies Reviewed: Additional studies/ records that were reviewed today include: EKG. Review of the above records demonstrates:  Please see elsewhere in the note.     ASSESSMENT AND PLAN:  RBBB/LAFB:  The patient has no symptoms related to this. This is progression from the previous EKG. In the absence of symptoms with a very active lifestyle and negative stress test previously I don't think further cardiovascular imaging or testing is indicated. He does however need continued risk reduction as below.  HTN:  The blood pressure is at target. No change in medications is indicated. We will continue with therapeutic lifestyle changes (TLC).  DYSLIPIDEMIA:  He has an excellent lipid profile and will need any change in therapy.  TOBACCO:  We had a long discussion about this. He already has prescription for Chantix. He will now think about actually using this.   Current medicines are reviewed at length with the patient today.  The patient does not  have concerns regarding medicines.  The following changes have been made:  no change  Labs/ tests ordered today include: None    Disposition:   FU with me as needed.    Signed, Minus Breeding, MD  04/11/2015 3:49 PM    [facility]

## 2015-04-16 ENCOUNTER — Other Ambulatory Visit: Payer: Self-pay | Admitting: Internal Medicine

## 2015-04-30 HISTORY — PX: SPINE SURGERY: SHX786

## 2015-05-02 ENCOUNTER — Other Ambulatory Visit: Payer: Self-pay | Admitting: *Deleted

## 2015-05-02 DIAGNOSIS — G4700 Insomnia, unspecified: Secondary | ICD-10-CM

## 2015-05-02 MED ORDER — ZOLPIDEM TARTRATE 5 MG PO TABS: 5.0000 mg | ORAL_TABLET | Freq: Every evening | ORAL | Status: DC | PRN

## 2015-05-07 ENCOUNTER — Other Ambulatory Visit: Payer: Self-pay | Admitting: Physician Assistant

## 2015-05-29 ENCOUNTER — Ambulatory Visit: Payer: Self-pay | Admitting: Internal Medicine

## 2015-06-27 ENCOUNTER — Other Ambulatory Visit: Payer: Self-pay | Admitting: Physician Assistant

## 2015-06-27 ENCOUNTER — Encounter: Payer: Self-pay | Admitting: Physician Assistant

## 2015-06-27 DIAGNOSIS — Z0000 Encounter for general adult medical examination without abnormal findings: Secondary | ICD-10-CM

## 2015-06-27 DIAGNOSIS — N400 Enlarged prostate without lower urinary tract symptoms: Secondary | ICD-10-CM

## 2015-06-27 MED ORDER — TADALAFIL 5 MG PO TABS: 5.0000 mg | ORAL_TABLET | Freq: Every day | ORAL | Status: DC | PRN

## 2015-07-13 ENCOUNTER — Other Ambulatory Visit: Payer: Self-pay | Admitting: Internal Medicine

## 2015-07-18 ENCOUNTER — Other Ambulatory Visit: Payer: Self-pay | Admitting: Internal Medicine

## 2015-08-03 ENCOUNTER — Other Ambulatory Visit: Payer: Self-pay | Admitting: Internal Medicine

## 2015-08-21 ENCOUNTER — Encounter: Payer: Self-pay | Admitting: Physician Assistant

## 2015-08-21 ENCOUNTER — Ambulatory Visit (INDEPENDENT_AMBULATORY_CARE_PROVIDER_SITE_OTHER): Payer: Medicare Other | Admitting: Physician Assistant

## 2015-08-21 VITALS — BP 140/90 | HR 72 | Temp 97.5°F | Resp 14 | Ht 74.0 in | Wt 210.0 lb

## 2015-08-21 DIAGNOSIS — Z789 Other specified health status: Secondary | ICD-10-CM

## 2015-08-21 DIAGNOSIS — Z0001 Encounter for general adult medical examination with abnormal findings: Secondary | ICD-10-CM | POA: Diagnosis not present

## 2015-08-21 DIAGNOSIS — Z23 Encounter for immunization: Secondary | ICD-10-CM

## 2015-08-21 DIAGNOSIS — E559 Vitamin D deficiency, unspecified: Secondary | ICD-10-CM

## 2015-08-21 DIAGNOSIS — R6889 Other general symptoms and signs: Secondary | ICD-10-CM

## 2015-08-21 DIAGNOSIS — E782 Mixed hyperlipidemia: Secondary | ICD-10-CM

## 2015-08-21 DIAGNOSIS — Z0000 Encounter for general adult medical examination without abnormal findings: Secondary | ICD-10-CM

## 2015-08-21 DIAGNOSIS — F17200 Nicotine dependence, unspecified, uncomplicated: Secondary | ICD-10-CM | POA: Diagnosis not present

## 2015-08-21 DIAGNOSIS — Z1159 Encounter for screening for other viral diseases: Secondary | ICD-10-CM | POA: Diagnosis not present

## 2015-08-21 DIAGNOSIS — N400 Enlarged prostate without lower urinary tract symptoms: Secondary | ICD-10-CM | POA: Diagnosis not present

## 2015-08-21 DIAGNOSIS — Z9181 History of falling: Secondary | ICD-10-CM

## 2015-08-21 DIAGNOSIS — N529 Male erectile dysfunction, unspecified: Secondary | ICD-10-CM

## 2015-08-21 DIAGNOSIS — I10 Essential (primary) hypertension: Secondary | ICD-10-CM | POA: Diagnosis not present

## 2015-08-21 DIAGNOSIS — R7309 Other abnormal glucose: Secondary | ICD-10-CM | POA: Diagnosis not present

## 2015-08-21 DIAGNOSIS — Z6826 Body mass index (BMI) 26.0-26.9, adult: Secondary | ICD-10-CM

## 2015-08-21 DIAGNOSIS — Z79899 Other long term (current) drug therapy: Secondary | ICD-10-CM | POA: Diagnosis not present

## 2015-08-21 DIAGNOSIS — Z136 Encounter for screening for cardiovascular disorders: Secondary | ICD-10-CM

## 2015-08-21 LAB — BASIC METABOLIC PANEL WITH GFR
BUN: 13 mg/dL (ref 7–25)
CO2: 26 mmol/L (ref 20–31)
Calcium: 9.3 mg/dL (ref 8.6–10.3)
Chloride: 100 mmol/L (ref 98–110)
Creat: 1.05 mg/dL (ref 0.70–1.25)
GFR, Est African American: 86 mL/min (ref >=60)
GFR, Est Non African American: 74 mL/min (ref >=60)
Glucose, Bld: 92 mg/dL (ref 65–99)
Potassium: 4.3 mmol/L (ref 3.5–5.3)
Sodium: 137 mmol/L (ref 135–146)

## 2015-08-21 LAB — CBC WITH DIFFERENTIAL/PLATELET
Basophils Absolute: 0.1 10*3/uL (ref 0.0–0.1)
Basophils Relative: 1 % (ref 0–1)
Eosinophils Absolute: 0.2 10*3/uL (ref 0.0–0.7)
Eosinophils Relative: 3 % (ref 0–5)
HCT: 43.2 % (ref 39.0–52.0)
Hemoglobin: 15.4 g/dL (ref 13.0–17.0)
Lymphocytes Relative: 22 % (ref 12–46)
Lymphs Abs: 1.4 10*3/uL (ref 0.7–4.0)
MCH: 30.8 pg (ref 26.0–34.0)
MCHC: 35.6 g/dL (ref 30.0–36.0)
MCV: 86.4 fL (ref 78.0–100.0)
MPV: 9.8 fL (ref 8.6–12.4)
Monocytes Absolute: 0.6 10*3/uL (ref 0.1–1.0)
Monocytes Relative: 9 % (ref 3–12)
Neutro Abs: 4.2 10*3/uL (ref 1.7–7.7)
Neutrophils Relative %: 65 % (ref 43–77)
Platelets: 254 10*3/uL (ref 150–400)
RBC: 5 MIL/uL (ref 4.22–5.81)
RDW: 12.8 % (ref 11.5–15.5)
WBC: 6.4 10*3/uL (ref 4.0–10.5)

## 2015-08-21 LAB — HEPATIC FUNCTION PANEL
ALT: 11 U/L (ref 9–46)
AST: 13 U/L (ref 10–35)
Albumin: 4.1 g/dL (ref 3.6–5.1)
Alkaline Phosphatase: 74 U/L (ref 40–115)
Bilirubin, Direct: 0.2 mg/dL (ref <=0.2)
Indirect Bilirubin: 0.4 mg/dL (ref 0.2–1.2)
Total Bilirubin: 0.6 mg/dL (ref 0.2–1.2)
Total Protein: 6.6 g/dL (ref 6.1–8.1)

## 2015-08-21 LAB — LIPID PANEL
Cholesterol: 95 mg/dL — ABNORMAL LOW (ref 125–200)
HDL: 34 mg/dL — ABNORMAL LOW (ref >=40)
LDL Cholesterol: 52 mg/dL (ref <130)
Total CHOL/HDL Ratio: 2.8 Ratio (ref <=5.0)
Triglycerides: 44 mg/dL (ref <150)
VLDL: 9 mg/dL (ref <30)

## 2015-08-21 LAB — TSH: TSH: 0.8 u[IU]/mL (ref 0.350–4.500)

## 2015-08-21 LAB — MAGNESIUM: Magnesium: 1.6 mg/dL (ref 1.5–2.5)

## 2015-08-21 LAB — HEMOGLOBIN A1C
Hgb A1c MFr Bld: 5.7 % — ABNORMAL HIGH (ref <5.7)
Mean Plasma Glucose: 117 mg/dL — ABNORMAL HIGH (ref <117)

## 2015-08-21 MED ORDER — TADALAFIL 20 MG PO TABS: 20.0000 mg | ORAL_TABLET | Freq: Every day | ORAL | Status: DC | PRN

## 2015-08-21 MED ORDER — CICLOPIROX 8 % EX SOLN: CUTANEOUS | Status: DC

## 2015-08-21 MED ORDER — TADALAFIL 5 MG PO TABS: 5.0000 mg | ORAL_TABLET | Freq: Every day | ORAL | Status: DC | PRN

## 2015-08-21 NOTE — Progress Notes (Signed)
MEDICARE ANNUAL WELLNESS VISIT AND FOLLOW UP Assessment:   1. Benign hypertension - continue medications, DASH diet, exercise and monitor at home. Call if greater than 130/80.  - CBC with Differential/Platelet - BASIC METABOLIC PANEL WITH GFR - Hepatic function panel - TSH - EKG 12-Lead  2. Mixed hyperlipidemia -continue medications, check lipids, decrease fatty foods, increase activity.  - Lipid panel  3. BPH (benign prostatic hyperplasia) - tadalafil (CIALIS) 5 MG tablet; Take 1 tablet (5 mg total) by mouth daily as needed for erectile dysfunction.  Dispense: 90 tablet; Refill: 1  4. Erectile dysfunction, unspecified erectile dysfunction type Cialis 20mg  PRN  5. Vitamin D deficiency disease Continue supplement  6. TOBACCO ABUSE Smoking cessation-  counseled patient on the dangers of tobacco use, advised patient to stop smoking, and reviewed strategies to maximize success, continue to try chantix  7. Medication management - Magnesium  8. Welcome to Medicare preventive visit Call Dr. Fuller Plan for colonoscopy  9. Screening for AAA (abdominal aortic aneurysm) - [place], RETROPERITNL ABD,  LTD  10. Other abnormal glucose - Hemoglobin A1c  11. Need for prophylactic vaccination and inoculation against influenza - Flu vaccine HIGH DOSE PF  Over 30 minutes of exam, counseling, chart review, and critical decision making was performed Future Appointments Date Time Provider [place]  01/15/2016 10:00 AM Starlyn Skeans, PA-C [facility] None    Plan:   During the course of the visit the patient was educated and counseled about appropriate screening and preventive services including:    Pneumococcal vaccine   Influenza vaccine  Prevnar 13  Td vaccine  Screening electrocardiogram  Colorectal cancer screening  Diabetes screening  Glaucoma screening  Nutrition counseling   Conditions/risks identified: BMI: Discussed weight loss, diet, and increase physical  activity.  Increase physical activity: AHA recommends 150 minutes of physical activity a week.  Medications reviewed Diabetes is at goal, ACE/ARB therapy: Yes. Urinary Incontinence is not an issue: discussed non pharmacology and pharmacology options.  Fall risk: low- discussed PT, home fall assessment, medications.    Subjective:  Arthur Garcia is a 64 y.o. male who presents for Medicare Annual Wellness Visit and 3 month follow up for HTN, hyperlipidemia, prediabetes, and vitamin D Def.   His blood pressure has been controlled at home, today their BP is BP: 140/90 mmHg He does not workout but is very active. He denies chest pain, shortness of breath, dizziness.  He is on cholesterol medication and denies myalgias. His cholesterol is at goal. The cholesterol last visit was:   Lab Results  Component Value Date   CHOL 145 01/09/2015   HDL 51 01/09/2015   LDLCALC 65 01/09/2015   TRIG 145 01/09/2015   CHOLHDL 2.8 01/09/2015   He has been working on diet and exercise for prediabetes, and denies paresthesia of the feet, polydipsia and polyuria. Last A1C in the office was:  Lab Results  Component Value Date   HGBA1C 5.9* 01/09/2015   Patient is on Vitamin D supplement.   Lab Results  Component Value Date   VD25OH 57 01/09/2015     Patient has ED, states that cialis helps, has seen Dr. Janice Norrie and will get 20 mg cialis.  Had lower back surgery with Dr. Vertell Limber in August of this year.  He does smoke, has chantix that he would like to start.     Medication Review: Current Outpatient Prescriptions on File Prior to Visit  Medication Sig Dispense Refill  . atorvastatin (LIPITOR) 20 MG tablet TAKE 1  TABLET DAILY FOR CHOLESTEROL 90 tablet 0  . Cholecalciferol (VITAMIN D-3) 5000 UNITS TABS Take 5,000 Units by mouth.    . pantoprazole (PROTONIX) 40 MG tablet TAKE 1 TABLET TWICE A DAY 180 tablet 2  . valsartan-hydrochlorothiazide (DIOVAN-HCT) 80-12.5 MG tablet TAKE 1 TABLET DAILY FOR BLOOD  PRESSURE 90 tablet 1  . zolpidem (AMBIEN) 5 MG tablet Take 1 tablet (5 mg total) by mouth at bedtime as needed for sleep. 90 tablet 1   No current facility-administered medications on file prior to visit.    Current Problems (verified) Patient Active Problem List   Diagnosis Date Noted  . Abnormal EKG 04/11/2015  . ED (erectile dysfunction) 04/05/2014  . TOBACCO ABUSE 10/31/2010  . Hyperlipemia 10/30/2010  . Essential hypertension 10/30/2010    Screening Tests Immunization History  Administered Date(s) Administered  . Influenza Split 07/05/2014  . Pneumococcal Polysaccharide-23 10/06/2012  . Td 09/29/2006   Preventative care: Last colonoscopy: 2008, he is overdue [place] AB 2014 US Carotid 2014 Stress test 2012  Prior vaccinations: TD or Tdap: 2008  Influenza: 2015, would like to get   Pneumococcal: 2014 Prevnar13: DUE, out of in the office Shingles/Zostavax: DUE, 45 dollars.   Names of Other Physician/Practitioners you currently use: 1. [facility] here for primary care 2. Dr. Delman Cheadle , eye doctor, last visit 05/2015 3. Dr. Antony Salmon, dentist, last visit 06/2015 Patient Care Team: Unk Pinto, MD as PCP - General (Internal Medicine) Erline Levine, MD as Consulting Physician (Neurosurgery) Ladene Artist, MD as Consulting Physician (Gastroenterology) Sharyne Peach, MD as Consulting Physician (Ophthalmology) Minus Breeding, MD as Consulting Physician (Cardiology) Lowella Bandy, MD as Consulting Physician (Urology) Druscilla Brownie, MD as Consulting Physician (Dermatology)  Past Surgical History  Procedure Laterality Date  . Uvuectomy    . Appendectomy    . Vasectomy    . Adenoidectomy      age 64  . Spine surgery  Aug 2016    Dr. Vertell Limber   Family History  Problem Relation Age of Onset  . Thyroid disease Mother   . Hypertension Mother   . Osteoporosis Mother   . Glaucoma Mother   . Thyroid disease Father   . Hypertension Father    . Hyperlipidemia Father   . Heart disease Father     CHF  . Heart disease Paternal Grandfather 46    Long standing "heart trouble"   Social History  Substance Use Topics  . Smoking status: Current Every Day Smoker -- 1.00 packs/day for 44 years    Types: Cigarettes  . Smokeless tobacco: Never Used     Comment: On and off  . Alcohol Use: 9.0 oz/week    15 Cans of beer per week    MEDICARE WELLNESS OBJECTIVES: Tobacco use: He does smoke. If yes, counseling given, start chantix Alcohol Current alcohol use: none Osteoporosis: dietary calcium and/or vitamin D deficiency, History of fracture in the past year: no Fall risk: Low Risk Hearing: impaired Visual acuity: normal,  does perform annual eye exam Diet: in general, a "healthy" diet   Physical activity: Current Exercise Habits:: Home exercise routine, Type of exercise: walking, Time (Minutes): 15, Frequency (Times/Week): 3, Weekly Exercise (Minutes/Week): 45, Intensity: Mild Cardiac risk factors: Cardiac Risk Factors include: advanced age (>51men, >66 women);dyslipidemia;hypertension;male gender;sedentary lifestyle;smoking/ tobacco exposure Depression/mood screen:   Depression screen [facility] 2/9 08/21/2015  Decreased Interest 0  Down, Depressed, Hopeless 0  PHQ - 2 Score 0    ADLs:  In your present  state of health, do you have any difficulty performing the following activities: 08/21/2015  Hearing? Y  Vision? N  Difficulty concentrating or making decisions? N  Walking or climbing stairs? N  Dressing or bathing? N  Doing errands, shopping? N  Preparing Food and eating ? N  Using the Toilet? N  In the past six months, have you accidently leaked urine? N  Do you have problems with loss of bowel control? N  Managing your Medications? N  Managing your Finances? N  Housekeeping or managing your Housekeeping? N     Cognitive Testing  Alert? Yes  Normal Appearance?Yes  Oriented to person? Yes  Place? Yes   Time? Yes  Recall  of three objects?  Yes  Can perform simple calculations? Yes  Displays appropriate judgment?Yes  Can read the correct time from a watch face?Yes  EOL planning: Does patient have an advance directive?: Yes Type of Advance Directive: Healthcare Power of Attorney, Living will Does patient want to make changes to advanced directive?: No - Patient declined Copy of advanced directive(s) in chart?: No - copy requested   Objective:   Today's Vitals   08/21/15 1039  BP: 140/90  Pulse: 72  Temp: 97.5 F (36.4 C)  TempSrc: Temporal  Resp: 14  Height: 6\' 2"  (1.88 m)  Weight: 210 lb (95.255 kg)  SpO2: 97%   Body mass index is 26.95 kg/(m^2).  General appearance: alert, no distress, WD/WN, male HEENT: normocephalic, sclerae anicteric, TMs pearly, nares patent, no discharge or erythema, pharynx normal Oral cavity: MMM, no lesions Neck: supple, no lymphadenopathy, no thyromegaly, no masses Heart: RRR, normal S1, S2, no murmurs Lungs: CTA bilaterally, no wheezes, rhonchi, or rales Abdomen: +bs, soft, non tender, non distended, no masses, no hepatomegaly, no splenomegaly Musculoskeletal: nontender, no swelling, no obvious deformity Extremities: no edema, no cyanosis, no clubbing Pulses: 2+ symmetric, upper and lower extremities, normal cap refill Neurological: alert, oriented x 3, CN2-12 intact, strength normal upper extremities and lower extremities, sensation normal throughout, DTRs 2+ throughout, no cerebellar signs, gait normal Psychiatric: normal affect, behavior normal, pleasant   Medicare Attestation I have personally reviewed: The patient's medical and social history Their use of alcohol, tobacco or illicit drugs Their current medications and supplements The patient's functional ability including ADLs,fall risks, home safety risks, cognitive, and hearing and visual impairment Diet and physical activities Evidence for depression or mood disorders  The patient's weight, height,  BMI, and visual acuity have been recorded in the chart.  I have made referrals, counseling, and provided education to the patient based on review of the above and I have provided the patient with a written personalized care plan for preventive services.     Vicie Mutters, PA-C   08/21/2015

## 2015-08-21 NOTE — Patient Instructions (Signed)
Call Dr. Fuller Plan for colonoscopy.   If you have a smart phone, please look up Smoke Free app, this will help you stay on track and give you information about money you have saved, life that you have gained back and a ton of more information.   We are giving you chantix for smoking cessation. You can do it! And we are here to help! You may have heard some scary side effects about chantix, the three most common I hear about are nausea, crazy dreams and depression.  However, I like for my patients to try to stay on 1/2 a tablet twice a day rather than one tablet twice a day as normally prescribed. This helps decrease the chances of side effects and helps save money by making a one month prescription last two months  Please start the prescription this way:  Start 1/2 tablet by mouth once daily after food with a full glass of water for 3 days Then do 1/2 tablet by mouth twice daily for 4 days. During this first week you can smoke, but try to stop after this week.  At this point we have several options: 1) continue on 1/2 tablet twice a day- which I encourage you to do. You can stay on this dose the rest of the time on the medication or if you still feel the need to smoke you can do one of the two options below. 2) do one tablet in the morning and 1/2 in the evening which helps decrease dreams. 3) do one tablet twice a day.   What if I miss a dose? If you miss a dose, take it as soon as you can. If it is almost time for your next dose, take only that dose. Do not take double or extra doses.  What should I watch for while using this medicine? Visit your doctor or health care professional for regular check ups. Ask for ongoing advice and encouragement from your doctor or healthcare professional, friends, and family to help you quit. If you smoke while on this medication, quit again  Your mouth may get dry. Chewing sugarless gum or hard candy, and drinking plenty of water may help. Contact your doctor if  the problem does not go away or is severe.  You may get drowsy or dizzy. Do not drive, use machinery, or do anything that needs mental alertness until you know how this medicine affects you. Do not stand or sit up quickly, especially if you are an older patient.   The use of this medicine may increase the chance of suicidal thoughts or actions. Pay special attention to how you are responding while on this medicine. Any worsening of mood, or thoughts of suicide or dying should be reported to your health care professional right away.  ADVANTAGES OF QUITTING SMOKING  Within 20 minutes, blood pressure decreases. Your pulse is at normal level.  After 8 hours, carbon monoxide levels in the blood return to normal. Your oxygen level increases.  After 24 hours, the chance of having a heart attack starts to decrease. Your breath, hair, and body stop smelling like smoke.  After 48 hours, damaged nerve endings begin to recover. Your sense of taste and smell improve.  After 72 hours, the body is virtually free of nicotine. Your bronchial tubes relax and breathing becomes easier.  After 2 to 12 weeks, lungs can hold more air. Exercise becomes easier and circulation improves.  After 1 year, the risk of coronary heart disease is  cut in half.  After 5 years, the risk of stroke falls to the same as a nonsmoker.  After 10 years, the risk of lung cancer is cut in half and the risk of other cancers decreases significantly.  After 15 years, the risk of coronary heart disease drops, usually to the level of a nonsmoker.  You will have extra money to spend on things other than cigarettes.  . Preventive Care for Adults A healthy lifestyle and preventive care can promote health and wellness. Preventive health guidelines for men include the following key practices:  A routine yearly physical is a good way to check with your health care provider about your health and preventative screening. It is a chance to  share any concerns and updates on your health and to receive a thorough exam.  Visit your dentist for a routine exam and preventative care every 6 months. Brush your teeth twice a day and floss once a day. Good oral hygiene prevents tooth decay and gum disease.  The frequency of eye exams is based on your age, health, family medical history, use of contact lenses, and other factors. Follow your health care provider's recommendations for frequency of eye exams.  Eat a healthy diet. Foods such as vegetables, fruits, whole grains, low-fat dairy products, and lean protein foods contain the nutrients you need without too many calories. Decrease your intake of foods high in solid fats, added sugars, and salt. Eat the right amount of calories for you.Get information about a proper diet from your health care provider, if necessary.  Regular physical exercise is one of the most important things you can do for your health. Most adults should get at least 150 minutes of moderate-intensity exercise (any activity that increases your heart rate and causes you to sweat) each week. In addition, most adults need muscle-strengthening exercises on 2 or more days a week.  Maintain a healthy weight. The body mass index (BMI) is a screening tool to identify possible weight problems. It provides an estimate of body fat based on height and weight. Your health care provider can find your BMI and can help you achieve or maintain a healthy weight.For adults 20 years and older:  A BMI below 18.5 is considered underweight.  A BMI of 18.5 to 24.9 is normal.  A BMI of 25 to 29.9 is considered overweight.  A BMI of 30 and above is considered obese.  Maintain normal blood lipids and cholesterol levels by exercising and minimizing your intake of saturated fat. Eat a balanced diet with plenty of fruit and vegetables. Blood tests for lipids and cholesterol should begin at age 107 and be repeated every 5 years. If your lipid or  cholesterol levels are high, you are over 50, or you are at high risk for heart disease, you may need your cholesterol levels checked more frequently.Ongoing high lipid and cholesterol levels should be treated with medicines if diet and exercise are not working.  If you smoke, find out from your health care provider how to quit. If you do not use tobacco, do not start.  Lung cancer screening is recommended for adults aged 88-80 years who are at high risk for developing lung cancer because of a history of smoking. A yearly low-dose CT scan of the lungs is recommended for people who have at least a 30-pack-year history of smoking and are a current smoker or have quit within the past 15 years. A pack year of smoking is smoking an average of 1  pack of cigarettes a day for 1 year (for example: 1 pack a day for 30 years or 2 packs a day for 15 years). Yearly screening should continue until the smoker has stopped smoking for at least 15 years. Yearly screening should be stopped for people who develop a health problem that would prevent them from having lung cancer treatment.  If you choose to drink alcohol, do not have more than 2 drinks per day. One drink is considered to be 12 ounces (355 mL) of beer, 5 ounces (148 mL) of wine, or 1.5 ounces (44 mL) of liquor.  Avoid use of street drugs. Do not share needles with anyone. Ask for help if you need support or instructions about stopping the use of drugs.  High blood pressure causes heart disease and increases the risk of stroke. Your blood pressure should be checked at least every 1-2 years. Ongoing high blood pressure should be treated with medicines, if weight loss and exercise are not effective.  If you are 38-65 years old, ask your health care provider if you should take aspirin to prevent heart disease.  Diabetes screening involves taking a blood sample to check your fasting blood sugar level. Testing should be considered at a younger age or be carried  out more frequently if you are overweight and have at least 1 risk factor for diabetes.  Colorectal cancer can be detected and often prevented. Most routine colorectal cancer screening begins at the age of 28 and continues through age 24. However, your health care provider may recommend screening at an earlier age if you have risk factors for colon cancer. On a yearly basis, your health care provider may provide home test kits to check for hidden blood in the stool. Use of a small camera at the end of a tube to directly examine the colon (sigmoidoscopy or colonoscopy) can detect the earliest forms of colorectal cancer. Talk to your health care provider about this at age 52, when routine screening begins. Direct exam of the colon should be repeated every 5-10 years through age 80, unless early forms of precancerous polyps or small growths are found.  Hepatitis C blood testing is recommended for all people born from [birth year] through 1966 and any individual with known risks for hepatitis C.  New guidelines recommend a once time screening for HIV.   Screening for abdominal aortic aneurysm (AAA)  by ultrasound is recommended for people who have history of high blood pressure or who are current or former smokers.  Healthy men should  receive prostate-specific antigen (PSA) blood tests as part of routine cancer screening. Talk with your health care provider about prostate cancer screening.  Testicular cancer screening is  recommended for adult males. Screening includes self-exam, a health care provider exam, and other screening tests. Consult with your health care provider about any symptoms you have or any concerns you have about testicular cancer.  Use sunscreen. Apply sunscreen liberally and repeatedly throughout the day. You should seek shade when your shadow is shorter than you. Protect yourself by wearing long sleeves, pants, a wide-brimmed hat, and sunglasses year round, whenever you are outdoors.  Once  a month, do a whole-body skin exam, using a mirror to look at the skin on your back. Tell your health care provider about new moles, moles that have irregular borders, moles that are larger than a pencil eraser, or moles that have changed in shape or color.  Stay current with required vaccines (immunizations).  Influenza vaccine. All  adults should be immunized every year.  Tetanus, diphtheria, and acellular pertussis (Td, Tdap) vaccine. An adult who has not previously received Tdap or who does not know his vaccine status should receive 1 dose of Tdap. This initial dose should be followed by tetanus and diphtheria toxoids (Td) booster doses every 10 years. Adults with an unknown or incomplete history of completing a 3-dose immunization series with Td-containing vaccines should begin or complete a primary immunization series including a Tdap dose. Adults should receive a Td booster every 10 years.  Zoster vaccine. One dose is recommended for adults aged 67 years or older unless certain conditions are present.    PREVNAR - Pneumococcal 13-valent conjugate (PCV13) vaccine. When indicated, a person who is uncertain of his immunization history and has no record of immunization should receive the PCV13 vaccine. An adult aged 4 years or older who has certain medical conditions and has not been previously immunized should receive 1 dose of PCV13 vaccine. This PCV13 should be followed with a dose of pneumococcal polysaccharide (PPSV23) vaccine. The PPSV23 vaccine dose should be obtained at least 8 weeks after the dose of PCV13 vaccine. An adult aged 96 years or older who has certain medical conditions and previously received 1 or more doses of PPSV23 vaccine should receive 1 dose of PCV13. The PCV13 vaccine dose should be obtained 1 or more years after the last PPSV23 vaccine dose.    PNEUMOVAX - Pneumococcal polysaccharide (PPSV23) vaccine. When PCV13 is also indicated, PCV13 should be obtained first. All  adults aged 30 years and older should be immunized. An adult younger than age 62 years who has certain medical conditions should be immunized. Any person who resides in a nursing home or long-term care facility should be immunized. An adult smoker should be immunized. People with an immunocompromised condition and certain other conditions should receive both PCV13 and PPSV23 vaccines. People with human immunodeficiency virus (HIV) infection should be immunized as soon as possible after diagnosis. Immunization during chemotherapy or radiation therapy should be avoided. Routine use of PPSV23 vaccine is not recommended for American Indians, [place]atives, or people younger than 65 years unless there are medical conditions that require PPSV23 vaccine. When indicated, people who have unknown immunization and have no record of immunization should receive PPSV23 vaccine. One-time revaccination 5 years after the first dose of PPSV23 is recommended for people aged 19-64 years who have chronic kidney failure, nephrotic syndrome, asplenia, or immunocompromised conditions. People who received 1-2 doses of PPSV23 before age 23 years should receive another dose of PPSV23 vaccine at age 76 years or later if at least 5 years have passed since the previous dose. Doses of PPSV23 are not needed for people immunized with PPSV23 at or after age 28 years.    Hepatitis A vaccine. Adults who wish to be protected from this disease, have certain high-risk conditions, work with hepatitis A-infected animals, work in hepatitis A research labs, or travel to or work in countries with a high rate of hepatitis A should be immunized. Adults who were previously unvaccinated and who anticipate close contact with an international adoptee during the first 60 days after arrival in the [place] from a country with a high rate of hepatitis A should be immunized.    Hepatitis B vaccine. Adults should be immunized if they wish to be protected  from this disease, have certain high-risk conditions, may be exposed to blood or other infectious body fluids, are household contacts or sex partners of  hepatitis B positive people, are clients or workers in certain care facilities, or travel to or work in countries with a high rate of hepatitis B.   Preventive Service / Frequency   Ages 37 and over  Blood pressure check.  Lipid and cholesterol check.  Lung cancer screening. / Every year if you are aged 24-80 years and have a 30-pack-year history of smoking and currently smoke or have quit within the past 15 years. Yearly screening is stopped once you have quit smoking for at least 15 years or develop a health problem that would prevent you from having lung cancer treatment.  Fecal occult blood test (FOBT) of stool. You may not have to do this test if you get a colonoscopy every 10 years.  Flexible sigmoidoscopy** or colonoscopy.** / Every 5 years for a flexible sigmoidoscopy or every 10 years for a colonoscopy beginning at age 45 and continuing until age 54.  Hepatitis C blood test.** / For all people born from [birth year] through 1966 and any individual with known risks for hepatitis C.  Abdominal aortic aneurysm (AAA) screening./ Screening current or former smokers or have Hypertension.  Skin self-exam. / Monthly.  Influenza vaccine. / Every year.  Tetanus, diphtheria, and acellular pertussis (Tdap/Td) vaccine.** / 1 dose of Td every 10 years.   Zoster vaccine.** / 1 dose for adults aged 53 years or older.         Pneumococcal 13-valent conjugate (PCV13) vaccine.    Pneumococcal polysaccharide (PPSV23) vaccine.     Hepatitis A vaccine.** / Consult your health care provider.  Hepatitis B vaccine.** / Consult your health care provider. Screening for abdominal aortic aneurysm (AAA)  by ultrasound is recommended for people who have history of high blood pressure or who are current or former smokers.

## 2015-08-22 LAB — HEPATITIS C ANTIBODY: HCV Ab: NEGATIVE

## 2015-08-27 ENCOUNTER — Encounter: Payer: Self-pay | Admitting: Physician Assistant

## 2015-08-28 MED ORDER — TADALAFIL 20 MG PO TABS: 20.0000 mg | ORAL_TABLET | Freq: Every day | ORAL | Status: DC | PRN

## 2015-08-28 NOTE — Addendum Note (Signed)
Addended by: Vicie Mutters R on: 08/28/2015 10:08 AM   Modules accepted: Orders

## 2015-10-08 ENCOUNTER — Encounter: Payer: Self-pay | Admitting: Physician Assistant

## 2015-10-09 MED ORDER — PANTOPRAZOLE SODIUM 40 MG PO TBEC: 40.0000 mg | DELAYED_RELEASE_TABLET | Freq: Two times a day (BID) | ORAL | Status: DC

## 2015-11-10 ENCOUNTER — Other Ambulatory Visit: Payer: Self-pay | Admitting: Internal Medicine

## 2015-11-10 DIAGNOSIS — G4700 Insomnia, unspecified: Secondary | ICD-10-CM

## 2015-11-12 ENCOUNTER — Encounter: Payer: Self-pay | Admitting: Emergency Medicine

## 2015-11-16 DIAGNOSIS — R109 Unspecified abdominal pain: Secondary | ICD-10-CM | POA: Diagnosis not present

## 2015-11-29 ENCOUNTER — Encounter: Payer: Self-pay | Admitting: Internal Medicine

## 2015-11-29 ENCOUNTER — Ambulatory Visit (INDEPENDENT_AMBULATORY_CARE_PROVIDER_SITE_OTHER): Payer: Medicare Other | Admitting: Internal Medicine

## 2015-11-29 VITALS — BP 158/80 | HR 96 | Temp 102.4°F | Resp 20 | Ht 74.0 in | Wt 204.0 lb

## 2015-11-29 DIAGNOSIS — J111 Influenza due to unidentified influenza virus with other respiratory manifestations: Secondary | ICD-10-CM | POA: Diagnosis not present

## 2015-11-29 DIAGNOSIS — R509 Fever, unspecified: Secondary | ICD-10-CM | POA: Diagnosis not present

## 2015-11-29 LAB — POC INFLUENZA A&B (BINAX/QUICKVUE)
Influenza A, POC: NEGATIVE
Influenza B, POC: NEGATIVE

## 2015-11-29 MED ORDER — AZITHROMYCIN 250 MG PO TABS: ORAL_TABLET | ORAL | Status: DC

## 2015-11-29 MED ORDER — PROMETHAZINE HCL 25 MG/ML IJ SOLN
25.0000 mg | Freq: Once | INTRAMUSCULAR | Status: AC
  Administered 2015-11-29: 25 mg via INTRAMUSCULAR

## 2015-11-29 MED ORDER — PREDNISONE 20 MG PO TABS: ORAL_TABLET | ORAL | Status: DC

## 2015-11-29 MED ORDER — IBUPROFEN 200 MG PO CAPS: 800.0000 mg | ORAL_CAPSULE | Freq: Four times a day (QID) | ORAL | Status: DC

## 2015-11-29 MED ORDER — PHENYLEPH-PROMETHAZINE-COD 5-6.25-10 MG/5ML PO SYRP: ORAL_SOLUTION | ORAL | Status: DC

## 2015-11-29 MED ORDER — KETOROLAC TROMETHAMINE 30 MG/ML IJ SOLN
60.0000 mg | Freq: Once | INTRAMUSCULAR | Status: AC
Start: 2015-11-29 — End: 2015-11-29
  Administered 2015-11-29: 60 mg via INTRAMUSCULAR

## 2015-11-29 NOTE — Progress Notes (Signed)
  HPI  Patient presents to the office for evaluation of cough, fever, and body aches.  It has been going on for 2 days.  Patient reports all the time, dry.  They also endorse change in voice, chills, fever, postnasal drip and myalgias, headaches, diaphoresis.  .  They have tried tylenol, aleve and ultram.  They report that nothing has worked.  They denies other sick contacts.  He did receive flu vaccine.  Review of Systems  Constitutional: Positive for fever, chills and malaise/fatigue.  HENT: Positive for congestion. Negative for ear pain and sore throat.   Respiratory: Positive for cough. Negative for sputum production, shortness of breath and wheezing.   Cardiovascular: Negative for chest pain, palpitations and leg swelling.  Musculoskeletal: Positive for myalgias.  Neurological: Positive for headaches.    PE:  Filed Vitals:   11/29/15 1603  BP: 158/80  Pulse: 96  Temp: 102.4 F (39.1 C)  Resp: 20    General:  Alert and non-toxic, WDWN, NAD HEENT: NCAT, PERLA, EOM normal, no occular discharge or erythema.  Nasal mucosal edema with sinus tenderness to palpation.  Oropharynx clear with minimal oropharyngeal edema and erythema.  Mucous membranes moist and pink. Neck:  Cervical adenopathy Chest:  RRR no MRGs.  Lungs clear to auscultation A&P with no wheezes rhonchi or rales.   Abdomen: +BS x 4 quadrants, soft, non-tender, no guarding, rigidity, or rebound. Skin: warm and dry no rash Neuro: A&Ox4, CN II-XII grossly intact  Assessment and Plan:   1. Influenza with respiratory manifestation -alternate tylenol and ibuprofen -push fluids -zpak -prednisone -phenergan codiene  2. Fever, unspecified  - promethazine (PHENERGAN) injection 25 mg; Inject 1 mL (25 mg total) into the muscle once. - ketorolac (TORADOL) 30 MG/ML injection 60 mg; Inject 2 mLs (60 mg total) into the muscle once.

## 2015-12-07 ENCOUNTER — Encounter: Payer: Self-pay | Admitting: Physician Assistant

## 2015-12-07 MED ORDER — PANTOPRAZOLE SODIUM 40 MG PO TBEC: 40.0000 mg | DELAYED_RELEASE_TABLET | Freq: Two times a day (BID) | ORAL | Status: DC

## 2016-01-15 ENCOUNTER — Encounter: Payer: Self-pay | Admitting: Internal Medicine

## 2016-02-05 ENCOUNTER — Telehealth: Payer: Self-pay | Admitting: *Deleted

## 2016-02-05 ENCOUNTER — Encounter: Payer: Self-pay | Admitting: Internal Medicine

## 2016-02-05 NOTE — Telephone Encounter (Signed)
Patient was lifting mulch and potting soil this morning and injured back.  Patient has Hydrocodone and Flexeril at home from [facility].  Patient was advised ov would be required for proper E/T but if unable to come in, ok to take Rxs and to use heat on back and stay mobile.  Patient expressed understanding.  Will f/u in office for any additional E/T.

## 2016-02-08 ENCOUNTER — Emergency Department (HOSPITAL_COMMUNITY)
Admission: EM | Admit: 2016-02-08 | Discharge: 2016-02-08 | Disposition: A | Discharge status: Home / Self Care | Payer: Medicare Other | Attending: Emergency Medicine | Admitting: Emergency Medicine

## 2016-02-08 ENCOUNTER — Encounter (HOSPITAL_COMMUNITY): Payer: Self-pay | Admitting: *Deleted

## 2016-02-08 DIAGNOSIS — M5431 Sciatica, right side: Secondary | ICD-10-CM

## 2016-02-08 DIAGNOSIS — Z7952 Long term (current) use of systemic steroids: Secondary | ICD-10-CM | POA: Insufficient documentation

## 2016-02-08 DIAGNOSIS — M5430 Sciatica, unspecified side: Secondary | ICD-10-CM | POA: Diagnosis present

## 2016-02-08 DIAGNOSIS — K219 Gastro-esophageal reflux disease without esophagitis: Secondary | ICD-10-CM | POA: Diagnosis not present

## 2016-02-08 DIAGNOSIS — Z791 Long term (current) use of non-steroidal anti-inflammatories (NSAID): Secondary | ICD-10-CM | POA: Diagnosis not present

## 2016-02-08 DIAGNOSIS — Z79899 Other long term (current) drug therapy: Secondary | ICD-10-CM | POA: Diagnosis not present

## 2016-02-08 DIAGNOSIS — M5441 Lumbago with sciatica, right side: Secondary | ICD-10-CM | POA: Insufficient documentation

## 2016-02-08 DIAGNOSIS — E785 Hyperlipidemia, unspecified: Secondary | ICD-10-CM | POA: Diagnosis not present

## 2016-02-08 DIAGNOSIS — I10 Essential (primary) hypertension: Secondary | ICD-10-CM | POA: Diagnosis not present

## 2016-02-08 DIAGNOSIS — Z792 Long term (current) use of antibiotics: Secondary | ICD-10-CM | POA: Insufficient documentation

## 2016-02-08 DIAGNOSIS — F17210 Nicotine dependence, cigarettes, uncomplicated: Secondary | ICD-10-CM | POA: Insufficient documentation

## 2016-02-08 MED ORDER — HYDROMORPHONE HCL 1 MG/ML IJ SOLN
1.0000 mg | Freq: Once | INTRAMUSCULAR | Status: AC
  Administered 2016-02-08: 1 mg via INTRAMUSCULAR
  Filled 2016-02-08: qty 1

## 2016-02-08 MED ORDER — DEXAMETHASONE SODIUM PHOSPHATE 10 MG/ML IJ SOLN
10.0000 mg | Freq: Once | INTRAMUSCULAR | Status: AC
  Administered 2016-02-08: 10 mg via INTRAMUSCULAR
  Filled 2016-02-08: qty 1

## 2016-02-08 MED ORDER — KETOROLAC TROMETHAMINE 30 MG/ML IJ SOLN
30.0000 mg | Freq: Once | INTRAMUSCULAR | Status: AC
  Administered 2016-02-08: 30 mg via INTRAMUSCULAR
  Filled 2016-02-08 (×2): qty 1

## 2016-02-08 NOTE — ED Provider Notes (Signed)
CSN: [id number]     Arrival date & time 02/08/16  1312 History  By signing my name below, I, [name], attest that this documentation has been prepared under the direction and in the presence of Hyman Bible, PA-C Electronically Signed: [name], ED Scribe. 02/08/2016. 1:50 PM.   Chief Complaint  Patient presents with  . Sciatica     The history is provided by the patient. No language interpreter was used.    HPI Comments: Arthur Garcia is a 65 y.o. male with a medical hx of sciatica, DDD, and HTN, who presents to the Emergency Department complaining of excerbation of sciatica onset 5 days. Pt reports that he had surgery to his lower back on 05/2015 completed by Dr. Erline Levine and hasn't had pain since. Pt states that his pain began while attempting to load garden soil onto a truck. Pt reports that his current pain is similar to the pain that he had in the past. Pt notes that his pain is worsened with ambulation. Pt reports that he has been taking hyrdocodone q 4 hours, flexeril, and methylprednisolone with no relief of his symptoms. Pt reports that he started the methylprednisolone 2 days ago after calling Dr. Melven Sartorius office with his new onset of symptoms. Pt wife attempted to call Dr. Vertell Limber office for an appointment with no success. Pt denies bowel/bladder incontinence, fever, numbness of legs, urinary symptoms, and any other symptoms. Pt denies any issues with his kidney in the past.    Past Medical History  Diagnosis Date  . Hypertension   . Hyperlipidemia   . Abnormal liver function test   . GERD (gastroesophageal reflux disease)   . Anxiety   . DDD (degenerative disc disease)   . Tobacco dependence   . Insomnia   . Colon polyp   . ED (erectile dysfunction) 04/05/2014   Past Surgical History  Procedure Laterality Date  . Uvuectomy    . Appendectomy    . Vasectomy    . Adenoidectomy      age 27  . Spine surgery  Aug 2016    Dr. Vertell Limber  . Dental surgery      Dental  implants   Family History  Problem Relation Age of Onset  . Thyroid disease Mother   . Hypertension Mother   . Osteoporosis Mother   . Glaucoma Mother   . Thyroid disease Father   . Hypertension Father   . Hyperlipidemia Father   . Heart disease Father     CHF  . Heart disease Paternal Grandfather 53    Long standing "heart trouble"   Social History  Substance Use Topics  . Smoking status: Current Every Day Smoker -- 0.50 packs/day for 44 years    Types: Cigarettes  . Smokeless tobacco: Never Used     Comment: On and off  . Alcohol Use: 9.0 oz/week    15 Cans of beer per week    Review of Systems  A complete 10 system review of systems was obtained and all systems are negative except as noted in the HPI and PMH.   Allergies  Review of patient's allergies indicates no known allergies.  Home Medications   Prior to Admission medications   Medication Sig Start Date End Date Taking? Authorizing Provider  atorvastatin (LIPITOR) 20 MG tablet TAKE 1 TABLET DAILY FOR CHOLESTEROL 05/07/15   Unk Pinto, MD  azithromycin (ZITHROMAX Z-PAK) 250 MG tablet 2 po day one, then 1 daily x 4 days 11/29/15  Courtney Forcucci, PA-C  Cholecalciferol (VITAMIN D-3) 5000 UNITS TABS Take 5,000 Units by mouth.    Historical Provider, MD  ciclopirox (PENLAC) 8 % solution Apply over nail and surrounding skin. Apply daily. After seven (7) days, may remove with alcohol and continue cycle. 08/21/15   Vicie Mutters, PA-C  Ibuprofen ([facility] IBUPROFEN) 200 MG CAPS Take 4 capsules (800 mg total) by mouth 4 (four) times daily. 11/29/15   Courtney Forcucci, PA-C  pantoprazole (PROTONIX) 40 MG tablet Take 1 tablet (40 mg total) by mouth 2 (two) times daily. 12/07/15   Vicie Mutters, PA-C  Phenyleph-Promethazine-Cod 5-6.25-10 MG/5ML SYRP Take 5-10 mL PO q8hrs prn for cold symptoms 11/29/15   Starlyn Skeans, PA-C  predniSONE (DELTASONE) 20 MG tablet 3 tabs po daily x 3 days, then 2 tabs x 3 days, then 1.5 tabs x 3  days, then 1 tab x 3 days, then 0.5 tabs x 3 days 11/29/15   Starlyn Skeans, PA-C  tadalafil (CIALIS) 20 MG tablet Take 1 tablet (20 mg total) by mouth daily as needed for erectile dysfunction. 08/28/15   Vicie Mutters, PA-C  tadalafil (CIALIS) 5 MG tablet Take 1 tablet (5 mg total) by mouth daily as needed for erectile dysfunction. 08/21/15   Vicie Mutters, PA-C  valsartan-hydrochlorothiazide (DIOVAN-HCT) 80-12.5 MG tablet TAKE 1 TABLET DAILY FOR BLOOD PRESSURE 07/13/15   Unk Pinto, MD  zolpidem (AMBIEN) 5 MG tablet TAKE1/2 TO 1 TABLET AT BEDTIME AS NEEDED FOR SLEEP 11/10/15   Unk Pinto, MD   BP 139/80 mmHg  Pulse 84  Temp(Src) 97.8 F (36.6 C) (Oral)  Resp 15  SpO2 100% Physical Exam  Constitutional: He is oriented to person, place, and time. He appears well-developed and well-nourished. No distress.  HENT:  Head: Normocephalic and atraumatic.  Eyes: EOM are normal.  Neck: Normal range of motion. Neck supple.  Cardiovascular: Normal rate and regular rhythm.   Pulmonary/Chest: Effort normal and breath sounds normal. No respiratory distress.  Abdominal: He exhibits no distension.  Musculoskeletal: Normal range of motion.  No TTP of lumbar spine.   Neurological: He is alert and oriented to person, place, and time.  5/5 strength intact bilaterally in lower extremities. Distal sensation of both feet intact. 2+ patellar reflexes.  Patient ambulating with limp favoring the right side  Skin: Skin is warm and dry.  Psychiatric: He has a normal mood and affect. His behavior is normal.  Nursing note and vitals reviewed.   ED Course  Procedures (including critical care time) DIAGNOSTIC STUDIES: Oxygen Saturation is 100% on RA, nl by my interpretation.    COORDINATION OF CARE: 1:49 PM Discussed treatment plan with pt at bedside which includes dilaudid injection, decadron injection, and toradol injection and pt agreed to plan.  2:36 PM- Pt re-evaluated and pt back pain  slightly improved following treatment. Pt able to ambulate with a slight limp favoring the right side.   Labs Review Labs Reviewed - No data to display  Imaging Review No results found.    EKG Interpretation None      MDM   Final diagnoses:  None     Patient with sciatic back pain. No neurological deficits and normal neuro exam. Patient is ambulatory with a slight limp favoring the right side. No loss of bowel or bladder control. No concern for cauda equina.  No fever, night sweats, weight loss, h/o cancer, IVDA, no recent procedure to back. No urinary symptoms suggestive of UTI. Pt given dilaudid, decadron, and toradol injection while  in the ED. Pt advised to follow up with Dr. Melven Sartorius office for further evaluation. Supportive care and return precaution discussed. Appears safe for discharge at this time. Follow up as indicated in discharge paperwork.    I personally performed the services described in this documentation, which was scribed in my presence. The recorded information has been reviewed and is accurate.     Hyman Bible, PA-C 02/08/16 1536  Milton Ferguson, MD 02/08/16 [phone]

## 2016-02-08 NOTE — ED Notes (Signed)
Per pt report: pt reports excerebration of pt's right sciatic pain.  Pt has surgery on Sept of 2016. Pain began on Monday when he picked up potting soil.  Pt normally ambulatory but pt is severe. Pt has been taken narcotic pain medications every 4 hours, a muscle relaxer and methlprednisolone and has no relief.  Pt a/o x 4.

## 2016-02-11 ENCOUNTER — Other Ambulatory Visit: Payer: Self-pay | Admitting: Internal Medicine

## 2016-02-11 ENCOUNTER — Other Ambulatory Visit: Payer: Self-pay | Admitting: *Deleted

## 2016-02-11 DIAGNOSIS — G4700 Insomnia, unspecified: Secondary | ICD-10-CM

## 2016-02-11 MED ORDER — ZOLPIDEM TARTRATE 5 MG PO TABS: ORAL_TABLET | ORAL | Status: DC

## 2016-02-12 ENCOUNTER — Ambulatory Visit (INDEPENDENT_AMBULATORY_CARE_PROVIDER_SITE_OTHER): Payer: Medicare Other | Admitting: Internal Medicine

## 2016-02-12 ENCOUNTER — Ambulatory Visit: Payer: Self-pay | Admitting: Internal Medicine

## 2016-02-12 ENCOUNTER — Encounter: Payer: Self-pay | Admitting: Internal Medicine

## 2016-02-12 VITALS — BP 122/80 | HR 92 | Temp 97.3°F | Resp 16 | Ht 73.0 in | Wt 206.4 lb

## 2016-02-12 DIAGNOSIS — R7303 Prediabetes: Secondary | ICD-10-CM | POA: Diagnosis not present

## 2016-02-12 DIAGNOSIS — M5441 Lumbago with sciatica, right side: Secondary | ICD-10-CM

## 2016-02-12 DIAGNOSIS — E559 Vitamin D deficiency, unspecified: Secondary | ICD-10-CM | POA: Insufficient documentation

## 2016-02-12 DIAGNOSIS — E785 Hyperlipidemia, unspecified: Secondary | ICD-10-CM

## 2016-02-12 DIAGNOSIS — I10 Essential (primary) hypertension: Secondary | ICD-10-CM | POA: Insufficient documentation

## 2016-02-12 DIAGNOSIS — Z79899 Other long term (current) drug therapy: Secondary | ICD-10-CM | POA: Insufficient documentation

## 2016-02-12 DIAGNOSIS — R7309 Other abnormal glucose: Secondary | ICD-10-CM | POA: Diagnosis not present

## 2016-02-12 NOTE — Patient Instructions (Signed)

## 2016-02-13 LAB — BASIC METABOLIC PANEL WITH GFR
BUN: 22 mg/dL (ref 7–25)
CO2: 27 mmol/L (ref 20–31)
Calcium: 9.1 mg/dL (ref 8.6–10.3)
Chloride: 101 mmol/L (ref 98–110)
Creat: 1.22 mg/dL (ref 0.70–1.25)
GFR, Est African American: 71 mL/min (ref >=60)
GFR, Est Non African American: 62 mL/min (ref >=60)
Glucose, Bld: 94 mg/dL (ref 65–99)
Potassium: 4.5 mmol/L (ref 3.5–5.3)
Sodium: 138 mmol/L (ref 135–146)

## 2016-02-13 LAB — CBC WITH DIFFERENTIAL/PLATELET
Basophils Absolute: 72 cells/uL (ref 0–200)
Basophils Relative: 1 %
Eosinophils Absolute: 432 cells/uL (ref 15–500)
Eosinophils Relative: 6 %
HCT: 46.5 % (ref 38.5–50.0)
Hemoglobin: 15.8 g/dL (ref 13.2–17.1)
Lymphocytes Relative: 30 %
Lymphs Abs: 2160 cells/uL (ref 850–3900)
MCH: 30.5 pg (ref 27.0–33.0)
MCHC: 34 g/dL (ref 32.0–36.0)
MCV: 89.8 fL (ref 80.0–100.0)
MPV: 9.8 fL (ref 7.5–12.5)
Monocytes Absolute: 432 cells/uL (ref 200–950)
Monocytes Relative: 6 %
Neutro Abs: 4104 cells/uL (ref 1500–7800)
Neutrophils Relative %: 57 %
Platelets: 261 10*3/uL (ref 140–400)
RBC: 5.18 MIL/uL (ref 4.20–5.80)
RDW: 13.7 % (ref 11.0–15.0)
WBC: 7.2 10*3/uL (ref 3.8–10.8)

## 2016-02-13 LAB — HEPATIC FUNCTION PANEL
ALT: 17 U/L (ref 9–46)
AST: 17 U/L (ref 10–35)
Albumin: 4.2 g/dL (ref 3.6–5.1)
Alkaline Phosphatase: 61 U/L (ref 40–115)
Bilirubin, Direct: 0.1 mg/dL (ref <=0.2)
Indirect Bilirubin: 0.4 mg/dL (ref 0.2–1.2)
Total Bilirubin: 0.5 mg/dL (ref 0.2–1.2)
Total Protein: 6.5 g/dL (ref 6.1–8.1)

## 2016-02-13 LAB — LIPID PANEL
Cholesterol: 109 mg/dL — ABNORMAL LOW (ref 125–200)
HDL: 45 mg/dL (ref >=40)
LDL Cholesterol: 50 mg/dL (ref <130)
Total CHOL/HDL Ratio: 2.4 Ratio (ref <=5.0)
Triglycerides: 70 mg/dL (ref <150)
VLDL: 14 mg/dL (ref <30)

## 2016-02-13 LAB — HEMOGLOBIN A1C
Hgb A1c MFr Bld: 5.6 % (ref <5.7)
Mean Plasma Glucose: 114 mg/dL

## 2016-02-13 LAB — INSULIN, RANDOM: Insulin: 4.5 u[IU]/mL (ref 2.0–19.6)

## 2016-02-13 LAB — VITAMIN D 25 HYDROXY (VIT D DEFICIENCY, FRACTURES): Vit D, 25-Hydroxy: 71 ng/mL (ref 30–100)

## 2016-02-13 LAB — TSH: TSH: 1.2 mIU/L (ref 0.40–4.50)

## 2016-02-13 LAB — MAGNESIUM: Magnesium: 1.6 mg/dL (ref 1.5–2.5)

## 2016-02-16 ENCOUNTER — Encounter: Payer: Self-pay | Admitting: Internal Medicine

## 2016-02-16 ENCOUNTER — Ambulatory Visit
Admission: RE | Admit: 2016-02-16 | Discharge: 2016-02-16 | Disposition: A | Discharge status: Home / Self Care | Payer: Medicare Other | Source: Ambulatory Visit | Attending: Internal Medicine | Admitting: Internal Medicine

## 2016-02-16 DIAGNOSIS — M79604 Pain in right leg: Secondary | ICD-10-CM | POA: Diagnosis not present

## 2016-02-16 DIAGNOSIS — M5441 Lumbago with sciatica, right side: Secondary | ICD-10-CM

## 2016-02-16 NOTE — Progress Notes (Deleted)
  Subjective:    Patient ID: Arthur Garcia, male    DOB: 04-08-1951, 65 y.o.   MRN: [id number]  HPI    Review of Systems     Objective:   Physical Exam        Assessment & Plan:

## 2016-02-16 NOTE — Progress Notes (Deleted)
Patient ID: Arthur Garcia, male   DOB: 18-Sep-1951, 65 y.o.   MRN: [id number]

## 2016-02-16 NOTE — Progress Notes (Signed)
Patient ID: Arthur Garcia, male   DOB: [birth year], 65 y.o.   MRN: [id number]  [facility]                       Unk Pinto, M.D.        Uvaldo Bristle. Silverio Lay, P.A.-C       Starlyn Skeans, P.A.-C   [facility]                [address] [place], [place] [id number] Telephone [phone] Telefax [phone] _________________________________________________________________________     This very nice 65 y.o. MWM presents for  follow up with Hypertension, Hyperlipidemia, Pre-Diabetes and Vitamin D Deficiency. Patient also has pertinent hx/o Lumbar disc surgery in Sept 2016 and recently has has an unprovoked exacerbation of severe LBP and R sciatica rated at 4 to 8 or 9 /10 which has persisted 10 days. It's interrupting his sleep. Denies any bowel or bladder issues.      Patient is treated for HTN circa 2001 & BP has been controlled at home. Today's BP: 122/80 mmHg. Patient has had no complaints of any cardiac type chest pain, palpitations, dyspnea/orthopnea/PND, dizziness, claudication, or dependent edema.     Hyperlipidemia is controlled with diet & Atorvastatin. Patient denies myalgias or other med SE's. Last Lipids are at goal with Cholesterol 109*; HDL 45; LDL 50; Triglycerides 70.     Also, the patient has history of PreDiabetes with A1c 5.9% in 2015 and 5.8% in 2016 and he has had no symptoms of reactive hypoglycemia, diabetic polys, paresthesias or visual blurring.  Current A1c is at goal with A1c 5.6%.      Further, the patient also has history of Vitamin D Deficiency and supplements vitamin D without any suspected side-effects. Current vitamin D is at goal with a level of 71.      Medication Sig  . atorvastatin 20 MG  TAKE 1 TABLET DAILY FOR CHOLESTEROL  . VITAMIN D 5000 UNITS  Take 5,000 Units by mouth.  . Ibuprofen 200 MG  Take 4 capsules (800 mg total) by mouth 4 (four) times daily.  .  pantoprazole  40 MG  Take 1 tablet (40 mg total) by mouth 2 (two) times daily.  . tadalafil  20 MG Take 1 tablet (20 mg total) by mouth daily as needed for erectile dysfunction.  . valsartan-hctz 80-12.5  TAKE 1 TABLET DAILY FOR BLOOD PRESSURE  . zolpidem  5 MG  TAKE1/2 TO 1 TABLET AT BEDTIME AS NEEDED FOR SLEEP  . PENLAC 8 % soln Apply over nail and surrounding skin. Apply daily. After seven (7) days, may remove with alcohol and continue cycle.  . tadalafil  5 MG  Take 1 tablet (5 mg total) by mouth daily as needed for erectile dysfunction.   No Known Allergies  PMHx:   Past Medical History  Diagnosis Date  . Hypertension   . Hyperlipidemia   . Abnormal liver function test   . GERD (gastroesophageal reflux disease)   . Anxiety   . DDD (degenerative disc disease)   . Tobacco dependence   . Insomnia   . Colon polyp   . ED (erectile dysfunction) 04/05/2014   Immunization History  Administered Date(s) Administered  . Influenza Split 07/05/2014  . Influenza, High Dose Seasonal PF 08/21/2015  . Pneumococcal Polysaccharide-23 10/06/2012  . Td 09/29/2006  .  Zoster 08/21/2015   Past Surgical History  Procedure Laterality Date  . Uvuectomy    . Appendectomy    . Vasectomy    . Adenoidectomy      age 34  . Spine surgery  Aug 2016    Dr. Vertell Limber  . Dental surgery      Dental implants   FHx:    Reviewed / unchanged  SHx:    Reviewed / unchanged  Systems Review:  Constitutional: Denies fever, chills, wt changes, headaches, insomnia, fatigue, night sweats, change in appetite. Eyes: Denies redness, blurred vision, diplopia, discharge, itchy, watery eyes.  ENT: Denies discharge, congestion, post nasal drip, epistaxis, sore throat, earache, hearing loss, dental pain, tinnitus, vertigo, sinus pain, snoring.  CV: Denies chest pain, palpitations, irregular heartbeat, syncope, dyspnea, diaphoresis, orthopnea, PND, claudication or edema. Respiratory: denies cough, dyspnea, DOE, pleurisy,  hoarseness, laryngitis, wheezing.  Gastrointestinal: Denies dysphagia, odynophagia, heartburn, reflux, water brash, abdominal pain or cramps, nausea, vomiting, bloating, diarrhea, constipation, hematemesis, melena, hematochezia  or hemorrhoids. Genitourinary: Denies dysuria, frequency, urgency, nocturia, hesitancy, discharge, hematuria or flank pain. Musculoskeletal: Denies arthralgias, myalgias, stiffness, jt. swelling, pain, limping or strain/sprain.  Skin: Denies pruritus, rash, hives, warts, acne, eczema or change in skin lesion(s). Neuro: No weakness, tremor, incoordination, spasms, paresthesia or pain. Psychiatric: Denies confusion, memory loss or sensory loss. Endo: Denies change in weight, skin or hair change.  Heme/Lymph: No excessive bleeding, bruising or enlarged lymph nodes.  Physical Exam  BP 122/80 mmHg  Pulse 92  Temp(Src) 97.3 F (36.3 C)  Resp 16  Ht 6\' 1"  (1.854 m)  Wt 206 lb 6.4 oz (93.622 kg)  BMI 27.24 kg/m2  Appears well nourished and in no distress. Eyes: PERRLA, EOMs, conjunctiva no swelling or erythema. Sinuses: No frontal/maxillary tenderness ENT/Mouth: EAC's clear, TM's nl w/o erythema, bulging. Nares clear w/o erythema, swelling, exudates. Oropharynx clear without erythema or exudates. Oral hygiene is good. Tongue normal, non obstructing. Hearing intact.  Neck: Supple. Thyroid nl. Car 2+/2+ without bruits, nodes or JVD. Chest: Respirations nl with BS clear & equal w/o rales, rhonchi, wheezing or stridor.  Cor: Heart sounds normal w/ regular rate and rhythm without sig. murmurs, gallops, clicks, or rubs. Peripheral pulses normal and equal  without edema.  Abdomen: Soft & bowel sounds normal. Non-tender w/o guarding, rebound, hernias, masses, or organomegaly.  Lymphatics: Unremarkable.  Musculoskeletal: Full ROM all peripheral extremities, joint stability, 5/5 strength, and normal gait.  Skin: Warm, dry without exposed rashes, lesions or ecchymosis apparent.   Neuro: Cranial nerves intact, reflexes equal bilaterally. Sensory-motor testing grossly intact. Tendon reflexes grossly intact.  Pysch: Alert & oriented x 3.  Insight and judgement nl & appropriate. No ideations.  Assessment and Plan:  1. Essential hypertension  - TSH  2. Hyperlipemia  - Lipid panel - TSH  3. Prediabetes  - Hemoglobin A1c - Insulin, random  4. Vitamin D deficiency  - VITAMIN D 25 Hydroxy   5. Right-sided low back pain with right-sided sciatica  - has old Rx Lyrica 75 mg and advised to increase to 2 caps = 150 mg 2 to 3 x/day til he sees Dr Vertell Limber as scheduled already on 02/18/2016 - Also given Rx Norco 5 mg to take 1 to 2 tablets 4 x day as need til sees Dr Vertell Limber  - MR Lumbar Spine Wo Contrast  6. Medication management  - CBC with Differential/Platelet - BASIC METABOLIC PANEL WITH GFR - Hepatic function panel - Magnesium   Recommended regular exercise,  BP monitoring, weight control, and discussed med and SE's. Recommended labs to assess and monitor clinical status. Further disposition pending results of labs. Over 30 minutes of exam, counseling, chart review was performed

## 2016-02-18 ENCOUNTER — Encounter: Payer: Self-pay | Admitting: Internal Medicine

## 2016-02-18 DIAGNOSIS — M5126 Other intervertebral disc displacement, lumbar region: Secondary | ICD-10-CM | POA: Diagnosis not present

## 2016-02-18 DIAGNOSIS — M7138 Other bursal cyst, other site: Secondary | ICD-10-CM | POA: Diagnosis not present

## 2016-02-18 DIAGNOSIS — Z6826 Body mass index (BMI) 26.0-26.9, adult: Secondary | ICD-10-CM | POA: Diagnosis not present

## 2016-02-18 DIAGNOSIS — M5417 Radiculopathy, lumbosacral region: Secondary | ICD-10-CM | POA: Diagnosis not present

## 2016-02-18 DIAGNOSIS — M545 Low back pain: Secondary | ICD-10-CM | POA: Diagnosis not present

## 2016-02-19 ENCOUNTER — Other Ambulatory Visit: Payer: Self-pay | Admitting: Internal Medicine

## 2016-02-21 DIAGNOSIS — M545 Low back pain: Secondary | ICD-10-CM | POA: Diagnosis not present

## 2016-02-21 DIAGNOSIS — M7138 Other bursal cyst, other site: Secondary | ICD-10-CM | POA: Diagnosis not present

## 2016-02-21 DIAGNOSIS — M5126 Other intervertebral disc displacement, lumbar region: Secondary | ICD-10-CM | POA: Diagnosis not present

## 2016-02-25 ENCOUNTER — Other Ambulatory Visit: Payer: Self-pay | Admitting: Internal Medicine

## 2016-02-25 DIAGNOSIS — G4700 Insomnia, unspecified: Secondary | ICD-10-CM

## 2016-02-26 ENCOUNTER — Other Ambulatory Visit: Payer: Self-pay | Admitting: *Deleted

## 2016-02-26 DIAGNOSIS — G4700 Insomnia, unspecified: Secondary | ICD-10-CM

## 2016-02-26 MED ORDER — ZOLPIDEM TARTRATE 5 MG PO TABS: 2.5000 mg | ORAL_TABLET | Freq: Every evening | ORAL | Status: DC | PRN

## 2016-03-03 DIAGNOSIS — M5126 Other intervertebral disc displacement, lumbar region: Secondary | ICD-10-CM | POA: Diagnosis not present

## 2016-03-03 DIAGNOSIS — M7138 Other bursal cyst, other site: Secondary | ICD-10-CM | POA: Diagnosis not present

## 2016-03-03 DIAGNOSIS — M545 Low back pain: Secondary | ICD-10-CM | POA: Diagnosis not present

## 2016-03-03 DIAGNOSIS — M5417 Radiculopathy, lumbosacral region: Secondary | ICD-10-CM | POA: Diagnosis not present

## 2016-03-06 ENCOUNTER — Other Ambulatory Visit: Payer: Self-pay | Admitting: Internal Medicine

## 2016-03-06 ENCOUNTER — Other Ambulatory Visit: Payer: Self-pay | Admitting: *Deleted

## 2016-03-06 MED ORDER — ATORVASTATIN CALCIUM 20 MG PO TABS: 20.0000 mg | ORAL_TABLET | Freq: Every day | ORAL | Status: DC

## 2016-03-18 DIAGNOSIS — M5116 Intervertebral disc disorders with radiculopathy, lumbar region: Secondary | ICD-10-CM | POA: Diagnosis not present

## 2016-03-18 DIAGNOSIS — M4727 Other spondylosis with radiculopathy, lumbosacral region: Secondary | ICD-10-CM | POA: Diagnosis not present

## 2016-03-18 DIAGNOSIS — M5117 Intervertebral disc disorders with radiculopathy, lumbosacral region: Secondary | ICD-10-CM | POA: Diagnosis not present

## 2016-03-18 DIAGNOSIS — M7138 Other bursal cyst, other site: Secondary | ICD-10-CM | POA: Diagnosis not present

## 2016-03-18 DIAGNOSIS — M5127 Other intervertebral disc displacement, lumbosacral region: Secondary | ICD-10-CM | POA: Diagnosis not present

## 2016-03-18 DIAGNOSIS — M5136 Other intervertebral disc degeneration, lumbar region: Secondary | ICD-10-CM | POA: Diagnosis not present

## 2016-03-18 DIAGNOSIS — M4726 Other spondylosis with radiculopathy, lumbar region: Secondary | ICD-10-CM | POA: Diagnosis not present

## 2016-03-23 ENCOUNTER — Other Ambulatory Visit: Payer: Self-pay | Admitting: Physician Assistant

## 2016-03-23 MED ORDER — PANTOPRAZOLE SODIUM 40 MG PO TBEC: 40.0000 mg | DELAYED_RELEASE_TABLET | Freq: Two times a day (BID) | ORAL | Status: DC

## 2016-03-23 NOTE — Addendum Note (Signed)
Addended by: Starlyn Skeans A on: 03/23/2016 01:31 PM   Modules accepted: Orders

## 2016-03-30 ENCOUNTER — Other Ambulatory Visit: Payer: Self-pay | Admitting: Internal Medicine

## 2016-03-30 DIAGNOSIS — G4700 Insomnia, unspecified: Secondary | ICD-10-CM

## 2016-03-30 MED ORDER — ZOLPIDEM TARTRATE 5 MG PO TABS: 2.5000 mg | ORAL_TABLET | Freq: Every evening | ORAL | Status: DC | PRN

## 2016-04-02 ENCOUNTER — Other Ambulatory Visit: Payer: Self-pay | Admitting: *Deleted

## 2016-04-02 DIAGNOSIS — G4700 Insomnia, unspecified: Secondary | ICD-10-CM

## 2016-04-02 MED ORDER — ZOLPIDEM TARTRATE 5 MG PO TABS: 2.5000 mg | ORAL_TABLET | Freq: Every evening | ORAL | Status: DC | PRN

## 2016-04-22 DIAGNOSIS — H2513 Age-related nuclear cataract, bilateral: Secondary | ICD-10-CM | POA: Diagnosis not present

## 2016-05-20 ENCOUNTER — Encounter: Payer: Self-pay | Admitting: Internal Medicine

## 2016-05-20 ENCOUNTER — Ambulatory Visit (INDEPENDENT_AMBULATORY_CARE_PROVIDER_SITE_OTHER): Payer: Medicare Other | Admitting: Internal Medicine

## 2016-05-20 VITALS — BP 120/70 | HR 82 | Temp 97.0°F | Resp 16 | Ht 74.0 in | Wt 212.0 lb

## 2016-05-20 DIAGNOSIS — I10 Essential (primary) hypertension: Secondary | ICD-10-CM | POA: Diagnosis not present

## 2016-05-20 DIAGNOSIS — R7303 Prediabetes: Secondary | ICD-10-CM | POA: Diagnosis not present

## 2016-05-20 DIAGNOSIS — N528 Other male erectile dysfunction: Secondary | ICD-10-CM

## 2016-05-20 DIAGNOSIS — Z79899 Other long term (current) drug therapy: Secondary | ICD-10-CM | POA: Diagnosis not present

## 2016-05-20 DIAGNOSIS — R9431 Abnormal electrocardiogram [ECG] [EKG]: Secondary | ICD-10-CM | POA: Diagnosis not present

## 2016-05-20 DIAGNOSIS — F17200 Nicotine dependence, unspecified, uncomplicated: Secondary | ICD-10-CM | POA: Diagnosis not present

## 2016-05-20 DIAGNOSIS — Z125 Encounter for screening for malignant neoplasm of prostate: Secondary | ICD-10-CM

## 2016-05-20 DIAGNOSIS — Z1212 Encounter for screening for malignant neoplasm of rectum: Secondary | ICD-10-CM

## 2016-05-20 DIAGNOSIS — Z136 Encounter for screening for cardiovascular disorders: Secondary | ICD-10-CM | POA: Diagnosis not present

## 2016-05-20 DIAGNOSIS — Z23 Encounter for immunization: Secondary | ICD-10-CM

## 2016-05-20 DIAGNOSIS — E785 Hyperlipidemia, unspecified: Secondary | ICD-10-CM | POA: Diagnosis not present

## 2016-05-20 DIAGNOSIS — E559 Vitamin D deficiency, unspecified: Secondary | ICD-10-CM | POA: Diagnosis not present

## 2016-05-20 LAB — CBC WITH DIFFERENTIAL/PLATELET
Basophils Absolute: 42 cells/uL (ref 0–200)
Basophils Relative: 1 %
Eosinophils Absolute: 336 cells/uL (ref 15–500)
Eosinophils Relative: 8 %
HCT: 41.9 % (ref 38.5–50.0)
Hemoglobin: 14.4 g/dL (ref 13.2–17.1)
Lymphocytes Relative: 22 %
Lymphs Abs: 924 cells/uL (ref 850–3900)
MCH: 31.3 pg (ref 27.0–33.0)
MCHC: 34.4 g/dL (ref 32.0–36.0)
MCV: 91.1 fL (ref 80.0–100.0)
MPV: 10.5 fL (ref 7.5–12.5)
Monocytes Absolute: 336 cells/uL (ref 200–950)
Monocytes Relative: 8 %
Neutro Abs: 2562 cells/uL (ref 1500–7800)
Neutrophils Relative %: 61 %
Platelets: 229 10*3/uL (ref 140–400)
RBC: 4.6 MIL/uL (ref 4.20–5.80)
RDW: 13.4 % (ref 11.0–15.0)
WBC: 4.2 10*3/uL (ref 3.8–10.8)

## 2016-05-20 LAB — BASIC METABOLIC PANEL WITH GFR
BUN: 11 mg/dL (ref 7–25)
CO2: 27 mmol/L (ref 20–31)
Calcium: 9.3 mg/dL (ref 8.6–10.3)
Chloride: 102 mmol/L (ref 98–110)
Creat: 0.98 mg/dL (ref 0.70–1.25)
GFR, Est African American: >89 mL/min (ref >=60)
GFR, Est Non African American: 81 mL/min (ref >=60)
Glucose, Bld: 101 mg/dL — ABNORMAL HIGH (ref 65–99)
Potassium: 4.2 mmol/L (ref 3.5–5.3)
Sodium: 137 mmol/L (ref 135–146)

## 2016-05-20 LAB — HEPATIC FUNCTION PANEL
ALT: 15 U/L (ref 9–46)
AST: 17 U/L (ref 10–35)
Albumin: 4.1 g/dL (ref 3.6–5.1)
Alkaline Phosphatase: 55 U/L (ref 40–115)
Bilirubin, Direct: 0.1 mg/dL (ref <=0.2)
Indirect Bilirubin: 0.4 mg/dL (ref 0.2–1.2)
Total Bilirubin: 0.5 mg/dL (ref 0.2–1.2)
Total Protein: 6.5 g/dL (ref 6.1–8.1)

## 2016-05-20 LAB — LIPID PANEL
Cholesterol: 124 mg/dL — ABNORMAL LOW (ref 125–200)
HDL: 48 mg/dL (ref >=40)
LDL Cholesterol: 58 mg/dL (ref <130)
Total CHOL/HDL Ratio: 2.6 Ratio (ref <=5.0)
Triglycerides: 88 mg/dL (ref <150)
VLDL: 18 mg/dL (ref <30)

## 2016-05-20 LAB — MAGNESIUM: Magnesium: 1.6 mg/dL (ref 1.5–2.5)

## 2016-05-20 MED ORDER — TERBINAFINE HCL 250 MG PO TABS: 250.0000 mg | ORAL_TABLET | Freq: Every day | ORAL | 0 refills | Status: AC

## 2016-05-20 MED ORDER — TADALAFIL 20 MG PO TABS: 20.0000 mg | ORAL_TABLET | Freq: Every day | ORAL | 3 refills | Status: DC | PRN

## 2016-05-20 MED ORDER — TRAZODONE HCL 50 MG PO TABS: ORAL_TABLET | ORAL | 0 refills | Status: DC

## 2016-05-20 NOTE — Progress Notes (Signed)
Complete Physical  Assessment and Plan:  1. Essential hypertension -cont meds -well controlled - Urinalysis, Routine w reflex microscopic (not at [facility]) - Microalbumin / creatinine urine ratio - EKG 12-Lead - DG Chest 2 View; Future - TSH  2. Hyperlipemia -cont lipitor - Lipid panel  3. Prediabetes -cont diet and exercise - Hemoglobin A1c - Insulin, random  4. Vitamin D deficiency -cont Vit D supplement - VITAMIN D 25 Hydroxy (Vit-D Deficiency, Fractures)  5. Medication management  - CBC with Differential/Platelet - BASIC METABOLIC PANEL WITH GFR - Hepatic function panel - Magnesium  6. Other male erectile dysfunction -cialis given -if patient cannot afford consider using doxazosin or flomax  7. Abnormal EKG -existing 1st degree AV block and  RBBB  8. TOBACCO ABUSE -not ready to quit yet -does have chantix at home - DG Chest 2 View; Future  9. Screening for prostate cancer  - PSA  10. Screening for rectal cancer  - POC Hemoccult Bld/Stl (3-Cd Home Screen); Future  11. Need for prophylactic vaccination against Streptococcus pneumoniae (pneumococcus)  - Pneumococcal conjugate vaccine 13-valent    Discussed med's effects and SE's. Screening labs and tests as requested with regular follow-up as recommended.  HPI Patient presents for a complete physical.   His blood pressure has been controlled at home, today their BP is BP: 120/70 He does workout. He denies chest pain, shortness of breath, dizziness.   He is on cholesterol medication and denies myalgias. His cholesterol is at goal. The cholesterol last visit was:   Lab Results  Component Value Date   CHOL 109 (L) 02/12/2016   HDL 45 02/12/2016   LDLCALC 50 02/12/2016   TRIG 70 02/12/2016   CHOLHDL 2.4 02/12/2016    He has been working on diet and exercise for prediabetes, he is on bASA, he is on ACE/ARB and denies foot ulcerations, hyperglycemia, hypoglycemia , increased appetite, nausea,  paresthesia of the feet, polydipsia, polyuria, visual disturbances, vomiting and weight loss. Last A1C in the office was:  Lab Results  Component Value Date   HGBA1C 5.6 02/12/2016    Patient is on Vitamin D supplement.   Lab Results  Component Value Date   VD25OH 71 02/12/2016      Last PSA was: Lab Results  Component Value Date   PSA 2.75 01/09/2015  .  Denies BPH symptoms daytime frequency, double voiding, dysuria, hematuria, hesitancy, incontinence, intermittency, nocturia, sensation of incomplete bladder emptying, suprapubic pain, urgency or weak urinary stream.  He reports that he had a back surgery with Dr. Vertell Limber last month.  He reports that he is doing much better.  He is not doing physical therapy currently.    He did try taking trazodone instead of ambien  And this helped much more with his sleeping.    He reports that he is still smoking.       Current Medications:  Current Outpatient Prescriptions on File Prior to Visit  Medication Sig Dispense Refill  . atorvastatin (LIPITOR) 20 MG tablet Take 1 tablet (20 mg total) by mouth daily. for cholesterol 90 tablet 0  . Cholecalciferol (VITAMIN D-3) 5000 UNITS TABS Take 5,000 Units by mouth.    . pantoprazole (PROTONIX) 40 MG tablet Take 1 tablet (40 mg total) by mouth 2 (two) times daily. 180 tablet 0  . tadalafil (CIALIS) 20 MG tablet Take 1 tablet (20 mg total) by mouth daily as needed for erectile dysfunction. 30 tablet 3  . valsartan-hydrochlorothiazide (DIOVAN-HCT) 80-12.5 MG tablet TAKE  1 TABLET DAILY FOR BLOOD PRESSURE 90 tablet 1  . zolpidem (AMBIEN) 5 MG tablet Take 0.5-1 tablets (2.5-5 mg total) by mouth at bedtime as needed. 90 tablet 0   No current facility-administered medications on file prior to visit.     Health Maintenance:  Immunization History  Administered Date(s) Administered  . Influenza Split 07/05/2014  . Influenza, High Dose Seasonal PF 08/21/2015  . Pneumococcal Polysaccharide-23 10/06/2012   . Td 09/29/2006  . Zoster 08/21/2015    Tetanus:2008, declines further Pneumovax: 2014 Prevnar 13: Given today Flu vaccine:Declines Zostavax:2016 DEXA:Declined Colonoscopy:2008 Eye Exam:Dr. Delman Cheadle Dentist:Continues to go twice yearly  Patient Care Team: Unk Pinto, MD as PCP - General (Internal Medicine) Erline Levine, MD as Consulting Physician (Neurosurgery) Ladene Artist, MD as Consulting Physician (Gastroenterology) Sharyne Peach, MD as Consulting Physician (Ophthalmology) Minus Breeding, MD as Consulting Physician (Cardiology) Lowella Bandy, MD as Consulting Physician (Urology) Druscilla Brownie, MD as Consulting Physician (Dermatology)  Allergies: No Known Allergies  Medical History:  Past Medical History:  Diagnosis Date  . Abnormal liver function test   . Anxiety   . Colon polyp   . DDD (degenerative disc disease)   . ED (erectile dysfunction) 04/05/2014  . GERD (gastroesophageal reflux disease)   . Hyperlipidemia   . Hypertension   . Insomnia   . Tobacco dependence     Surgical History:  Past Surgical History:  Procedure Laterality Date  . ADENOIDECTOMY     age 65  . APPENDECTOMY    . DENTAL SURGERY     Dental implants  . SPINE SURGERY  Aug 2016   Dr. Vertell Limber  . Uvuectomy    . VASECTOMY      Family History:  Family History  Problem Relation Age of Onset  . Thyroid disease Mother   . Hypertension Mother   . Osteoporosis Mother   . Glaucoma Mother   . Thyroid disease Father   . Hypertension Father   . Hyperlipidemia Father   . Heart disease Father     CHF  . Heart disease Paternal Grandfather 89    Long standing "heart trouble"    Social History:   Social History  Substance Use Topics  . Smoking status: Current Every Day Smoker    Packs/day: 0.50    Years: 44.00    Types: Cigarettes  . Smokeless tobacco: Never Used     Comment: On and off  . Alcohol use 9.0 oz/week    15 Cans of beer per week    Review of Systems:  Review of  Systems  Constitutional: Negative for chills, fever and malaise/fatigue.  HENT: Negative for congestion, ear pain and sore throat.   Eyes: Negative.   Respiratory: Negative for cough, shortness of breath and wheezing.   Cardiovascular: Negative for chest pain, palpitations and leg swelling.  Gastrointestinal: Negative for abdominal pain, blood in stool, constipation, diarrhea, heartburn and melena.  Genitourinary: Negative.   Skin: Negative.   Neurological: Negative for dizziness, sensory change, loss of consciousness and headaches.  Psychiatric/Behavioral: Negative for depression. The patient is not nervous/anxious and does not have insomnia.     Physical Exam: Estimated body mass index is 27.22 kg/m as calculated from the following:   Height as of this encounter: 6\' 2"  (1.88 m).   Weight as of this encounter: 212 lb (96.2 kg). BP 120/70   Pulse 82   Temp 97 F (36.1 C)   Resp 16   Ht 6\' 2"  (1.88 m)   Wt  212 lb (96.2 kg)   SpO2 97%   BMI 27.22 kg/m   General Appearance: Well nourished, in no apparent distress.  Eyes: PERRLA, EOMs, conjunctiva no swelling or erythema ENT/Mouth: Ear canals clear bilaterally with no erythema, swelling, discharge.  TMs normal bilaterally with no erythema, bulging, or retractions.  Oropharynx clear and moist with no exudate, swelling, or erythema.  Dentition normal.   Neck: Supple, thyroid normal. No bruits, JVD, cervical adenopathy Respiratory: Respiratory effort normal, BS equal bilaterally without rales, rhonchi, wheezing or stridor.  Cardio: RRR without murmurs, rubs or gallops. Brisk peripheral pulses without edema.  Chest: symmetric, with normal excursions Abdomen: Soft, nontender, no guarding, rebound, hernias, masses, or organomegaly. Genitourinary:  Musculoskeletal: Full ROM all peripheral extremities,5/5 strength, and normal gait.  Skin: Warm, dry without rashes, lesions, ecchymosis. Neuro: A&Ox3, Cranial nerves intact, reflexes equal  bilaterally. Normal muscle tone, no cerebellar symptoms. Sensation intact.  Psych: Normal affect, Insight and Judgment appropriate.   EKG: 1st degree AV block, existing RBBB, no ST changes, no change from prior  AORTA SCAN: WNL  [name], counseling, chart review and critical decision making was performed  Starlyn Skeans 9:27 AM [facility]

## 2016-05-21 LAB — MICROALBUMIN / CREATININE URINE RATIO
Creatinine, Urine: 51 mg/dL (ref 20–370)
Microalb Creat Ratio: 6 ug/mg{creat}
Microalb, Ur: 0.3 mg/dL

## 2016-05-21 LAB — INSULIN, RANDOM: Insulin: 10.7 u[IU]/mL (ref 2.0–19.6)

## 2016-05-21 LAB — URINALYSIS, ROUTINE W REFLEX MICROSCOPIC
Bilirubin Urine: NEGATIVE
Glucose, UA: NEGATIVE
Hgb urine dipstick: NEGATIVE
Ketones, ur: NEGATIVE
Leukocytes, UA: NEGATIVE
Nitrite: NEGATIVE
Protein, ur: NEGATIVE
Specific Gravity, Urine: 1.007 (ref 1.001–1.035)
pH: 6 (ref 5.0–8.0)

## 2016-05-21 LAB — PSA: PSA: 1.4 ng/mL (ref <=4.0)

## 2016-05-21 LAB — TSH: TSH: 0.65 mIU/L (ref 0.40–4.50)

## 2016-05-21 LAB — VITAMIN D 25 HYDROXY (VIT D DEFICIENCY, FRACTURES): Vit D, 25-Hydroxy: 78 ng/mL (ref 30–100)

## 2016-05-21 LAB — HEMOGLOBIN A1C
Hgb A1c MFr Bld: 5.2 %
Mean Plasma Glucose: 103 mg/dL

## 2016-05-26 NOTE — Addendum Note (Signed)
Addended by: Kathren Scearce A on: 05/26/2016 12:01 PM   Modules accepted: Orders

## 2016-06-04 ENCOUNTER — Other Ambulatory Visit: Payer: Self-pay | Admitting: Internal Medicine

## 2016-06-25 DIAGNOSIS — M7138 Other bursal cyst, other site: Secondary | ICD-10-CM | POA: Diagnosis not present

## 2016-06-25 DIAGNOSIS — M5417 Radiculopathy, lumbosacral region: Secondary | ICD-10-CM | POA: Diagnosis not present

## 2016-06-25 DIAGNOSIS — M545 Low back pain: Secondary | ICD-10-CM | POA: Diagnosis not present

## 2016-06-25 DIAGNOSIS — M5126 Other intervertebral disc displacement, lumbar region: Secondary | ICD-10-CM | POA: Diagnosis not present

## 2016-06-25 DIAGNOSIS — Z6826 Body mass index (BMI) 26.0-26.9, adult: Secondary | ICD-10-CM | POA: Diagnosis not present

## 2016-06-25 DIAGNOSIS — I10 Essential (primary) hypertension: Secondary | ICD-10-CM | POA: Diagnosis not present

## 2016-06-30 ENCOUNTER — Other Ambulatory Visit: Payer: Self-pay | Admitting: Neurosurgery

## 2016-06-30 DIAGNOSIS — M5417 Radiculopathy, lumbosacral region: Secondary | ICD-10-CM

## 2016-07-01 ENCOUNTER — Other Ambulatory Visit: Payer: Medicare Other

## 2016-07-01 DIAGNOSIS — Z79899 Other long term (current) drug therapy: Secondary | ICD-10-CM | POA: Diagnosis not present

## 2016-07-01 NOTE — Progress Notes (Unsigned)
Patient presents for 6 week recheck HFP per Starlyn Skeans, PA-C. Patient taking Lamisil Rx.

## 2016-07-02 LAB — HEPATIC FUNCTION PANEL
ALT: 12 U/L (ref 9–46)
AST: 16 U/L (ref 10–35)
Albumin: 4.4 g/dL (ref 3.6–5.1)
Alkaline Phosphatase: 55 U/L (ref 40–115)
Bilirubin, Direct: 0.2 mg/dL (ref <=0.2)
Indirect Bilirubin: 0.6 mg/dL (ref 0.2–1.2)
Total Bilirubin: 0.8 mg/dL (ref 0.2–1.2)
Total Protein: 6.9 g/dL (ref 6.1–8.1)

## 2016-07-13 ENCOUNTER — Ambulatory Visit
Admission: RE | Admit: 2016-07-13 | Discharge: 2016-07-13 | Disposition: A | Discharge status: Home / Self Care | Payer: Medicare Other | Source: Ambulatory Visit | Attending: Neurosurgery | Admitting: Neurosurgery

## 2016-07-13 DIAGNOSIS — M48061 Spinal stenosis, lumbar region without neurogenic claudication: Secondary | ICD-10-CM | POA: Diagnosis not present

## 2016-07-13 DIAGNOSIS — M5417 Radiculopathy, lumbosacral region: Secondary | ICD-10-CM

## 2016-07-13 MED ORDER — GADOBENATE DIMEGLUMINE 529 MG/ML IV SOLN
20.0000 mL | Freq: Once | INTRAVENOUS | Status: AC | PRN
  Administered 2016-07-13: 20 mL via INTRAVENOUS

## 2016-07-14 DIAGNOSIS — M545 Low back pain: Secondary | ICD-10-CM | POA: Diagnosis not present

## 2016-07-14 DIAGNOSIS — M5126 Other intervertebral disc displacement, lumbar region: Secondary | ICD-10-CM | POA: Diagnosis not present

## 2016-07-14 DIAGNOSIS — M5417 Radiculopathy, lumbosacral region: Secondary | ICD-10-CM | POA: Diagnosis not present

## 2016-07-14 DIAGNOSIS — M7138 Other bursal cyst, other site: Secondary | ICD-10-CM | POA: Diagnosis not present

## 2016-07-14 DIAGNOSIS — Z6826 Body mass index (BMI) 26.0-26.9, adult: Secondary | ICD-10-CM | POA: Diagnosis not present

## 2016-07-14 DIAGNOSIS — I10 Essential (primary) hypertension: Secondary | ICD-10-CM | POA: Diagnosis not present

## 2016-08-25 ENCOUNTER — Encounter: Payer: Self-pay | Admitting: Internal Medicine

## 2016-08-25 ENCOUNTER — Ambulatory Visit (INDEPENDENT_AMBULATORY_CARE_PROVIDER_SITE_OTHER): Payer: Medicare Other | Admitting: Internal Medicine

## 2016-08-25 VITALS — BP 116/62 | HR 68 | Temp 98.0°F | Resp 18 | Ht 74.0 in | Wt 200.0 lb

## 2016-08-25 DIAGNOSIS — Z79899 Other long term (current) drug therapy: Secondary | ICD-10-CM

## 2016-08-25 DIAGNOSIS — E559 Vitamin D deficiency, unspecified: Secondary | ICD-10-CM

## 2016-08-25 DIAGNOSIS — Z0001 Encounter for general adult medical examination with abnormal findings: Secondary | ICD-10-CM

## 2016-08-25 DIAGNOSIS — R6889 Other general symptoms and signs: Secondary | ICD-10-CM | POA: Diagnosis not present

## 2016-08-25 DIAGNOSIS — R7303 Prediabetes: Secondary | ICD-10-CM

## 2016-08-25 DIAGNOSIS — N528 Other male erectile dysfunction: Secondary | ICD-10-CM | POA: Diagnosis not present

## 2016-08-25 DIAGNOSIS — R9431 Abnormal electrocardiogram [ECG] [EKG]: Secondary | ICD-10-CM

## 2016-08-25 DIAGNOSIS — F17200 Nicotine dependence, unspecified, uncomplicated: Secondary | ICD-10-CM

## 2016-08-25 DIAGNOSIS — E782 Mixed hyperlipidemia: Secondary | ICD-10-CM | POA: Diagnosis not present

## 2016-08-25 DIAGNOSIS — I10 Essential (primary) hypertension: Secondary | ICD-10-CM | POA: Diagnosis not present

## 2016-08-25 DIAGNOSIS — Z0000 Encounter for general adult medical examination without abnormal findings: Secondary | ICD-10-CM

## 2016-08-25 MED ORDER — AMITRIPTYLINE HCL 25 MG PO TABS: 25.0000 mg | ORAL_TABLET | Freq: Two times a day (BID) | ORAL | 0 refills | Status: DC

## 2016-08-25 NOTE — Patient Instructions (Signed)

## 2016-08-25 NOTE — Progress Notes (Signed)
MEDICARE ANNUAL WELLNESS VISIT AND FOLLOW UP Assessment:    1. Essential hypertension -cont meds -diet and exercise -dash diet -monitor at home  2. Other male erectile dysfunction -cont cialis  3. Abnormal EKG -historical -no recent changes  4. Mixed hyperlipidemia -cont meds -recheck at next visit  5. Medication management -biyearly labs  6. Prediabetes -cont diet and exercise  7. TOBACCO ABUSE -still smoking -not interested in quitting  8. Vitamin D deficiency -cont Vit D   9. Encounter for Medicare annual wellness exam -due next year  28.  Chronic back pain -elavil   Over 30 minutes of exam, counseling, chart review, and critical decision making was performed  Future Appointments Date Time Provider [place]  02/19/2017 9:30 AM Starlyn Skeans, PA-C [facility] None  05/20/2017 9:00 AM Starlyn Skeans, PA-C [facility] None     Plan:   During the course of the visit the patient was educated and counseled about appropriate screening and preventive services including:    Pneumococcal vaccine   Influenza vaccine  Prevnar 13  Td vaccine  Screening electrocardiogram  Colorectal cancer screening  Diabetes screening  Glaucoma screening  Nutrition counseling    Subjective:  Arthur Garcia is a 65 y.o. male who presents for Medicare Annual Wellness Visit and 3 month follow up for HTN, hyperlipidemia, prediabete, and vitamin D Def.   His blood pressure has been controlled at home, today their BP is BP: 116/62 He does not workout. He denies chest pain, shortness of breath, dizziness.  He is on cholesterol medication and denies myalgias. His cholesterol is at goal. The cholesterol last visit was:   Lab Results  Component Value Date   CHOL 124 (L) 05/20/2016   HDL 48 05/20/2016   LDLCALC 58 05/20/2016   TRIG 88 05/20/2016   CHOLHDL 2.6 05/20/2016   Patient has well controlled A1c level with diet and exercise.  He has not recently  had an elevated level for his A1cs. :  Lab Results  Component Value Date   HGBA1C 5.2 05/20/2016   Last GFR Lab Results  Component Value Date   GFRNONAA 81 05/20/2016     Lab Results  Component Value Date   GFRAA >89 05/20/2016   Patient is on Vitamin D supplement.   Lab Results  Component Value Date   VD25OH 43 05/20/2016      He is still seeing Dr. Vertell Limber for his back and sciatica.  He hates the lyrica and would like to try something else.  He has never taken elavil for this.     Medication Review: Current Outpatient Prescriptions on File Prior to Visit  Medication Sig Dispense Refill  . atorvastatin (LIPITOR) 20 MG tablet TAKE 1 TABLET (20 MG TOTAL) BY MOUTH DAILY. FOR CHOLESTEROL 90 tablet 1  . Cholecalciferol (VITAMIN D-3) 5000 UNITS TABS Take 5,000 Units by mouth.    . pantoprazole (PROTONIX) 40 MG tablet Take 1 tablet (40 mg total) by mouth 2 (two) times daily. 180 tablet 0  . tadalafil (CIALIS) 20 MG tablet Take 1 tablet (20 mg total) by mouth daily as needed (BPH). 30 tablet 3  . traZODone (DESYREL) 50 MG tablet Take 1-2 tablets 30 minutes prior to bedtime. 180 tablet 0  . valsartan-hydrochlorothiazide (DIOVAN-HCT) 80-12.5 MG tablet TAKE 1 TABLET DAILY FOR BLOOD PRESSURE 90 tablet 1   No current facility-administered medications on file prior to visit.     Allergies: No Known Allergies  Current Problems (verified) has Hyperlipemia; TOBACCO ABUSE; ED (  erectile dysfunction); Abnormal EKG; HTN (hypertension); Prediabetes; Vitamin D deficiency; and Medication management on his problem list.  Screening Tests Immunization History  Administered Date(s) Administered  . Influenza Split 07/05/2014  . Influenza, High Dose Seasonal PF 08/21/2015  . Pneumococcal Conjugate-13 05/20/2016  . Pneumococcal Polysaccharide-23 10/06/2012  . Td 09/29/2006  . Zoster 08/21/2015    Preventative care: Last colonoscopy: 2008  Prior vaccinations: TD or Tdap: 2008  Influenza:  2016  Pneumococcal: 2014 Prevnar13: 2017 Shingles/Zostavax: 2016  Names of Other Physician/Practitioners you currently use: 1. [facility] here for primary care 2. Dr. Delman Cheadle, eye doctor, last visit 2017 3. Dr. Lavone Neri, dentist, last visit 2017 Patient Care Team: Unk Pinto, MD as PCP - General (Internal Medicine) Erline Levine, MD as Consulting Physician (Neurosurgery) Ladene Artist, MD as Consulting Physician (Gastroenterology) Sharyne Peach, MD as Consulting Physician (Ophthalmology) Minus Breeding, MD as Consulting Physician (Cardiology) Lowella Bandy, MD as Consulting Physician (Urology) Druscilla Brownie, MD as Consulting Physician (Dermatology)  Surgical: He  has a past surgical history that includes Uvuectomy; Appendectomy; Vasectomy; Adenoidectomy; Spine surgery (Aug 2016); and Dental surgery. Family His family history includes Glaucoma in his mother; Heart disease in his father; Heart disease (age of onset: 15) in his paternal grandfather; Hyperlipidemia in his father; Hypertension in his father and mother; Osteoporosis in his mother; Thyroid disease in his father and mother. Social history  He reports that he has been smoking Cigarettes.  He has a 22.00 pack-year smoking history. He has never used smokeless tobacco. He reports that he drinks about 9.0 oz of alcohol per week . He reports that he does not use drugs.  MEDICARE WELLNESS OBJECTIVES: Physical activity: Current Exercise Habits: The patient has a physically strenous job, but has no regular exercise apart from work., Exercise limited by: orthopedic condition(s) Cardiac risk factors: Cardiac Risk Factors include: advanced age (>3men, >68 women);dyslipidemia;hypertension;male gender Depression/mood screen:   Depression screen [facility] 2/9 08/25/2016  Decreased Interest 0  Down, Depressed, Hopeless 0  PHQ - 2 Score 0    ADLs:  In your present state of health, do you have any  difficulty performing the following activities: 08/25/2016 02/16/2016  Hearing? Y N  Vision? N N  Difficulty concentrating or making decisions? N N  Walking or climbing stairs? N N  Dressing or bathing? N N  Doing errands, shopping? N N  Preparing Food and eating ? N -  Using the Toilet? N -  In the past six months, have you accidently leaked urine? N -  Do you have problems with loss of bowel control? N -  Managing your Medications? N -  Managing your Finances? N -  Housekeeping or managing your Housekeeping? N -  Some recent data might be hidden     Cognitive Testing  Alert? Yes  Normal Appearance?Yes  Oriented to person? Yes  Place? Yes   Time? Yes  Recall of three objects?  Yes  Can perform simple calculations? Yes  Displays appropriate judgment?Yes  Can read the correct time from a watch face?Yes  EOL planning: Does Patient Have a Medical Advance Directive?: Yes Type of Advance Directive: Healthcare Power of Attorney, Living will Copy of [place] in Chart?: No - copy requested   Objective:   Today's Vitals   08/25/16 1123  BP: 116/62  Pulse: 68  Resp: 18  Temp: 98 F (36.7 C)  TempSrc: Temporal  Weight: 200 lb (90.7 kg)  Height: 6\' 2"  (1.88 m)  Body mass index is 25.68 kg/m.  General appearance: alert, no distress, WD/WN, male HEENT: normocephalic, sclerae anicteric, TMs pearly, nares patent, no discharge or erythema, pharynx normal Oral cavity: MMM, no lesions Neck: supple, no lymphadenopathy, no thyromegaly, no masses Heart: RRR, normal S1, S2, no murmurs Lungs: CTA bilaterally, no wheezes, rhonchi, or rales Abdomen: +bs, soft, non tender, non distended, no masses, no hepatomegaly, no splenomegaly Musculoskeletal: nontender, no swelling, no obvious deformity Extremities: no edema, no cyanosis, no clubbing Pulses: 2+ symmetric, upper and lower extremities, normal cap refill Neurological: alert, oriented x 3, CN2-12 intact, strength  normal upper extremities and lower extremities, sensation normal throughout, DTRs 2+ throughout, no cerebellar signs, gait normal Psychiatric: normal affect, behavior normal, pleasant   Medicare Attestation I have personally reviewed: The patient's medical and social history Their use of alcohol, tobacco or illicit drugs Their current medications and supplements The patient's functional ability including ADLs,fall risks, home safety risks, cognitive, and hearing and visual impairment Diet and physical activities Evidence for depression or mood disorders  The patient's weight, height, BMI, and visual acuity have been recorded in the chart.  I have made referrals, counseling, and provided education to the patient based on review of the above and I have provided the patient with a written personalized care plan for preventive services.     Starlyn Skeans, PA-C   08/25/2016

## 2016-08-27 DIAGNOSIS — M7138 Other bursal cyst, other site: Secondary | ICD-10-CM | POA: Diagnosis not present

## 2016-08-27 DIAGNOSIS — M5417 Radiculopathy, lumbosacral region: Secondary | ICD-10-CM | POA: Diagnosis not present

## 2016-08-27 DIAGNOSIS — Z6825 Body mass index (BMI) 25.0-25.9, adult: Secondary | ICD-10-CM | POA: Diagnosis not present

## 2016-08-27 DIAGNOSIS — I10 Essential (primary) hypertension: Secondary | ICD-10-CM | POA: Diagnosis not present

## 2016-08-27 DIAGNOSIS — M545 Low back pain: Secondary | ICD-10-CM | POA: Diagnosis not present

## 2016-08-27 DIAGNOSIS — M5126 Other intervertebral disc displacement, lumbar region: Secondary | ICD-10-CM | POA: Diagnosis not present

## 2016-10-21 ENCOUNTER — Other Ambulatory Visit: Payer: Self-pay | Admitting: Internal Medicine

## 2016-10-21 MED ORDER — PANTOPRAZOLE SODIUM 40 MG PO TBEC: 40.0000 mg | DELAYED_RELEASE_TABLET | Freq: Two times a day (BID) | ORAL | 0 refills | Status: DC

## 2016-10-22 DIAGNOSIS — M545 Low back pain: Secondary | ICD-10-CM | POA: Diagnosis not present

## 2016-11-05 ENCOUNTER — Other Ambulatory Visit: Payer: Self-pay | Admitting: Internal Medicine

## 2016-11-05 ENCOUNTER — Encounter: Payer: Self-pay | Admitting: Internal Medicine

## 2016-11-05 MED ORDER — ATORVASTATIN CALCIUM 20 MG PO TABS: 20.0000 mg | ORAL_TABLET | Freq: Every day | ORAL | 1 refills | Status: DC

## 2016-11-05 MED ORDER — TRAZODONE HCL 50 MG PO TABS: ORAL_TABLET | ORAL | 1 refills | Status: DC

## 2016-11-07 DIAGNOSIS — H905 Unspecified sensorineural hearing loss: Secondary | ICD-10-CM | POA: Diagnosis not present

## 2016-11-07 DIAGNOSIS — H9193 Unspecified hearing loss, bilateral: Secondary | ICD-10-CM | POA: Diagnosis not present

## 2016-11-07 DIAGNOSIS — J310 Chronic rhinitis: Secondary | ICD-10-CM | POA: Diagnosis not present

## 2016-11-11 ENCOUNTER — Other Ambulatory Visit: Payer: Self-pay | Admitting: Otolaryngology

## 2016-11-11 DIAGNOSIS — H9193 Unspecified hearing loss, bilateral: Secondary | ICD-10-CM

## 2016-11-12 ENCOUNTER — Ambulatory Visit
Admission: RE | Admit: 2016-11-12 | Discharge: 2016-11-12 | Disposition: A | Discharge status: Home / Self Care | Payer: Medicare Other | Source: Ambulatory Visit | Attending: Otolaryngology | Admitting: Otolaryngology

## 2016-11-12 DIAGNOSIS — H9193 Unspecified hearing loss, bilateral: Secondary | ICD-10-CM

## 2016-11-12 MED ORDER — GADOBENATE DIMEGLUMINE 529 MG/ML IV SOLN
18.0000 mL | Freq: Once | INTRAVENOUS | Status: AC | PRN
  Administered 2016-11-12: 18 mL via INTRAVENOUS

## 2016-11-19 DIAGNOSIS — M545 Low back pain: Secondary | ICD-10-CM | POA: Diagnosis not present

## 2016-11-19 DIAGNOSIS — I10 Essential (primary) hypertension: Secondary | ICD-10-CM | POA: Diagnosis not present

## 2016-11-19 DIAGNOSIS — M5126 Other intervertebral disc displacement, lumbar region: Secondary | ICD-10-CM | POA: Diagnosis not present

## 2016-11-19 DIAGNOSIS — M5417 Radiculopathy, lumbosacral region: Secondary | ICD-10-CM | POA: Diagnosis not present

## 2016-11-19 DIAGNOSIS — M7138 Other bursal cyst, other site: Secondary | ICD-10-CM | POA: Diagnosis not present

## 2016-11-19 DIAGNOSIS — Z6825 Body mass index (BMI) 25.0-25.9, adult: Secondary | ICD-10-CM | POA: Diagnosis not present

## 2017-02-05 ENCOUNTER — Encounter: Payer: Self-pay | Admitting: Internal Medicine

## 2017-02-11 DIAGNOSIS — M5417 Radiculopathy, lumbosacral region: Secondary | ICD-10-CM | POA: Diagnosis not present

## 2017-02-11 DIAGNOSIS — M5126 Other intervertebral disc displacement, lumbar region: Secondary | ICD-10-CM | POA: Diagnosis not present

## 2017-02-11 DIAGNOSIS — M7138 Other bursal cyst, other site: Secondary | ICD-10-CM | POA: Diagnosis not present

## 2017-02-11 DIAGNOSIS — M545 Low back pain: Secondary | ICD-10-CM | POA: Diagnosis not present

## 2017-02-19 ENCOUNTER — Ambulatory Visit: Payer: Self-pay | Admitting: Internal Medicine

## 2017-03-01 NOTE — Progress Notes (Signed)
Assessment and Plan:   Hypertension -Continue medication, monitor blood pressure at home. Continue DASH diet.  Reminder to go to the ER if any CP, SOB, nausea, dizziness, severe HA, changes vision/speech, left arm numbness and tingling and jaw pain.  Cholesterol -Continue diet and exercise. Check cholesterol.   Vitamin D Def - check level and continue medications.   Lower back pain Will add on Cymbalta for back pain and depression  Continue diet and meds as discussed. Further disposition pending results of labs. Over 30 minutes of exam, counseling, chart review, and critical decision making was performed  Future Appointments Date Time Provider [place]  03/02/2017 10:45 AM Vicie Mutters, PA-C [facility] None  05/20/2017 9:00 AM Vicie Mutters, PA-C [facility] None     HPI 66 y.o. male  presents for 3 month follow up on hypertension, cholesterol, prediabetes, and vitamin D deficiency.   His blood pressure has been controlled at home, today their BP is    He does workout. He denies chest pain, shortness of breath, dizziness.  He is on cholesterol medication and denies myalgias. His cholesterol is at goal. The cholesterol last visit was:   Lab Results  Component Value Date   CHOL 124 (L) 05/20/2016   HDL 48 05/20/2016   LDLCALC 58 05/20/2016   TRIG 88 05/20/2016   CHOLHDL 2.6 05/20/2016     Last A1C in the office was:  Lab Results  Component Value Date   HGBA1C 5.2 05/20/2016   Patient is on Vitamin D supplement.   Lab Results  Component Value Date   VD25OH 78 05/20/2016     Has been seeing Dr. Wilburn Cornelia for hearing loss, had recent normal MRI.  He follows with Dr. Maryjean Ka for lower back pain.   Current Medications:  Current Outpatient Prescriptions on File Prior to Visit  Medication Sig  . amitriptyline (ELAVIL) 25 MG tablet Take 1 tablet (25 mg total) by mouth 2 (two) times daily.  Marland Kitchen atorvastatin (LIPITOR) 20 MG tablet Take 1 tablet (20 mg total) by  mouth daily. for cholesterol  . Cholecalciferol (VITAMIN D-3) 5000 UNITS TABS Take 5,000 Units by mouth.  . pantoprazole (PROTONIX) 40 MG tablet Take 1 tablet (40 mg total) by mouth 2 (two) times daily.  . tadalafil (CIALIS) 20 MG tablet Take 1 tablet (20 mg total) by mouth daily as needed (BPH).  Marland Kitchen traZODone (DESYREL) 50 MG tablet Take 1-2 tablets 30 minutes prior to bedtime.  . valsartan-hydrochlorothiazide (DIOVAN-HCT) 80-12.5 MG tablet TAKE 1 TABLET DAILY FOR BLOOD PRESSURE   No current facility-administered medications on file prior to visit.     Medical History:  Past Medical History:  Diagnosis Date  . Abnormal liver function test   . Anxiety   . Colon polyp   . DDD (degenerative disc disease)   . ED (erectile dysfunction) 04/05/2014  . GERD (gastroesophageal reflux disease)   . Hyperlipidemia   . Hypertension   . Insomnia   . Tobacco dependence    Allergies: No Known Allergies   Review of Systems:  Review of Systems  Constitutional: Negative.   HENT: Negative.   Eyes: Negative.   Respiratory: Negative.   Cardiovascular: Negative.   Gastrointestinal: Negative.   Genitourinary: Negative.   Musculoskeletal: Positive for back pain and myalgias.  Skin: Negative.     Family history- Review and unchanged Social history- Review and unchanged Physical Exam: There were no vitals taken for this visit. Wt Readings from Last 3 Encounters:  08/25/16 200  lb (90.7 kg)  05/20/16 212 lb (96.2 kg)  02/16/16 206 lb (93.4 kg)   General Appearance: Well nourished, in no apparent distress. Eyes: PERRLA, EOMs, conjunctiva no swelling or erythema Sinuses: No Frontal/maxillary tenderness ENT/Mouth: Ext aud canals clear, TMs without erythema, bulging. No erythema, swelling, or exudate on post pharynx.  Tonsils not swollen or erythematous. Hearing normal.  Neck: Supple, thyroid normal.  Respiratory: Respiratory effort normal, BS equal bilaterally without rales, rhonchi, wheezing or  stridor.  Cardio: RRR with no MRGs. Brisk peripheral pulses without edema.  Abdomen: Soft, + BS,  Non tender, no guarding, rebound, hernias, masses. Lymphatics: Non tender without lymphadenopathy.  Musculoskeletal: Full ROM, 5/5 strength, Normal gait Skin: Warm, dry without rashes, lesions, ecchymosis.  Neuro: Cranial nerves intact. Normal muscle tone, no cerebellar symptoms. Psych: Awake and oriented X 3, normal affect, Insight and Judgment appropriate.    Vicie Mutters, PA-C 9:51 AM [facility]

## 2017-03-02 ENCOUNTER — Ambulatory Visit (INDEPENDENT_AMBULATORY_CARE_PROVIDER_SITE_OTHER): Payer: Medicare Other | Admitting: Physician Assistant

## 2017-03-02 ENCOUNTER — Encounter: Payer: Self-pay | Admitting: Physician Assistant

## 2017-03-02 VITALS — BP 122/70 | HR 78 | Temp 97.3°F | Resp 14 | Ht 74.0 in | Wt 190.8 lb

## 2017-03-02 DIAGNOSIS — E782 Mixed hyperlipidemia: Secondary | ICD-10-CM | POA: Diagnosis not present

## 2017-03-02 DIAGNOSIS — F17200 Nicotine dependence, unspecified, uncomplicated: Secondary | ICD-10-CM | POA: Diagnosis not present

## 2017-03-02 DIAGNOSIS — Z79899 Other long term (current) drug therapy: Secondary | ICD-10-CM | POA: Diagnosis not present

## 2017-03-02 DIAGNOSIS — E559 Vitamin D deficiency, unspecified: Secondary | ICD-10-CM | POA: Diagnosis not present

## 2017-03-02 DIAGNOSIS — I10 Essential (primary) hypertension: Secondary | ICD-10-CM

## 2017-03-02 LAB — CBC WITH DIFFERENTIAL/PLATELET
Basophils Absolute: 75 cells/uL (ref 0–200)
Basophils Relative: 1 %
Eosinophils Absolute: 225 cells/uL (ref 15–500)
Eosinophils Relative: 3 %
HCT: 43.8 % (ref 38.5–50.0)
Hemoglobin: 14.8 g/dL (ref 13.2–17.1)
Lymphocytes Relative: 14 %
Lymphs Abs: 1050 cells/uL (ref 850–3900)
MCH: 31.4 pg (ref 27.0–33.0)
MCHC: 33.8 g/dL (ref 32.0–36.0)
MCV: 92.8 fL (ref 80.0–100.0)
MPV: 9.9 fL (ref 7.5–12.5)
Monocytes Absolute: 600 cells/uL (ref 200–950)
Monocytes Relative: 8 %
Neutro Abs: 5550 cells/uL (ref 1500–7800)
Neutrophils Relative %: 74 %
Platelets: 218 10*3/uL (ref 140–400)
RBC: 4.72 MIL/uL (ref 4.20–5.80)
RDW: 13.1 % (ref 11.0–15.0)
WBC: 7.5 10*3/uL (ref 3.8–10.8)

## 2017-03-02 MED ORDER — DULOXETINE HCL 30 MG PO CPEP: 30.0000 mg | ORAL_CAPSULE | Freq: Every day | ORAL | 2 refills | Status: DC

## 2017-03-02 NOTE — Patient Instructions (Addendum)
Went into great detail and length that new studies show that opioids are better acute and short term pain management and long term recent studies show that patients on long term opioid use have worse outcomes and more complications than other medications.   Try the cymbalta 30mg  daily night or day for pain    Your ears and sinuses are connected by the eustachian tube. When your sinuses are inflamed, this can close off the tube and cause fluid to collect in your middle ear. This can then cause dizziness, popping, clicking, ringing, and echoing in your ears. This is often NOT an infection and does NOT require antibiotics, it is caused by inflammation so the treatments help the inflammation. This can take a long time to get better so please be patient.  Here are things you can do to help with this: - Try the Flonase or Nasonex. Remember to spray each nostril twice towards the outer part of your eye.  Do not sniff but instead pinch your nose and tilt your head back to help the medicine get into your sinuses.  The best time to do this is at bedtime.Stop if you get blurred vision or nose bleeds.  -While drinking fluids, pinch and hold nose close and swallow, to help open eustachian tubes to drain fluid behind ear drums. -Please pick one of the over the counter allergy medications below and take it once daily for allergies.  It will also help with fluid behind ear drums. Claritin or loratadine cheapest but likely the weakest  Zyrtec or certizine at night because it can make you sleepy The strongest is allegra or fexafinadine  Cheapest at [employer], [employer], [employer] -can use decongestant over the counter, please do not use if you have high blood pressure or certain heart conditions.   if worsening HA, changes vision/speech, imbalance, weakness go to the ER

## 2017-03-03 LAB — BASIC METABOLIC PANEL WITH GFR
BUN: 17 mg/dL (ref 7–25)
CO2: 26 mmol/L (ref 20–31)
Calcium: 9.6 mg/dL (ref 8.6–10.3)
Chloride: 102 mmol/L (ref 98–110)
Creat: 1.04 mg/dL (ref 0.70–1.25)
GFR, Est African American: 86 mL/min (ref >=60)
GFR, Est Non African American: 74 mL/min (ref >=60)
Glucose, Bld: 67 mg/dL (ref 65–99)
Potassium: 4.4 mmol/L (ref 3.5–5.3)
Sodium: 138 mmol/L (ref 135–146)

## 2017-03-03 LAB — LIPID PANEL
Cholesterol: 126 mg/dL (ref <200)
HDL: 44 mg/dL (ref >40)
LDL Cholesterol: 66 mg/dL (ref <100)
Total CHOL/HDL Ratio: 2.9 Ratio (ref <5.0)
Triglycerides: 79 mg/dL (ref <150)
VLDL: 16 mg/dL (ref <30)

## 2017-03-03 LAB — HEPATIC FUNCTION PANEL
ALT: 22 U/L (ref 9–46)
AST: 23 U/L (ref 10–35)
Albumin: 4.3 g/dL (ref 3.6–5.1)
Alkaline Phosphatase: 63 U/L (ref 40–115)
Bilirubin, Direct: 0.2 mg/dL (ref <=0.2)
Indirect Bilirubin: 0.5 mg/dL (ref 0.2–1.2)
Total Bilirubin: 0.7 mg/dL (ref 0.2–1.2)
Total Protein: 7 g/dL (ref 6.1–8.1)

## 2017-03-03 LAB — MAGNESIUM: Magnesium: 1.7 mg/dL (ref 1.5–2.5)

## 2017-03-03 LAB — TSH: TSH: 0.65 mIU/L (ref 0.40–4.50)

## 2017-04-23 DIAGNOSIS — H10413 Chronic giant papillary conjunctivitis, bilateral: Secondary | ICD-10-CM | POA: Diagnosis not present

## 2017-05-14 DIAGNOSIS — Z6825 Body mass index (BMI) 25.0-25.9, adult: Secondary | ICD-10-CM | POA: Diagnosis not present

## 2017-05-14 DIAGNOSIS — I10 Essential (primary) hypertension: Secondary | ICD-10-CM | POA: Diagnosis not present

## 2017-05-14 DIAGNOSIS — M7138 Other bursal cyst, other site: Secondary | ICD-10-CM | POA: Diagnosis not present

## 2017-05-14 DIAGNOSIS — M545 Low back pain: Secondary | ICD-10-CM | POA: Diagnosis not present

## 2017-05-14 DIAGNOSIS — M5126 Other intervertebral disc displacement, lumbar region: Secondary | ICD-10-CM | POA: Diagnosis not present

## 2017-05-14 DIAGNOSIS — M5417 Radiculopathy, lumbosacral region: Secondary | ICD-10-CM | POA: Diagnosis not present

## 2017-05-20 ENCOUNTER — Encounter: Payer: Self-pay | Admitting: Physician Assistant

## 2017-05-20 ENCOUNTER — Encounter: Payer: Self-pay | Admitting: Internal Medicine

## 2017-05-25 ENCOUNTER — Encounter: Payer: Self-pay | Admitting: Physician Assistant

## 2017-05-25 ENCOUNTER — Other Ambulatory Visit: Payer: Self-pay | Admitting: Physician Assistant

## 2017-05-25 MED ORDER — PANTOPRAZOLE SODIUM 40 MG PO TBEC: 40.0000 mg | DELAYED_RELEASE_TABLET | Freq: Two times a day (BID) | ORAL | 0 refills | Status: DC

## 2017-05-25 MED ORDER — VALSARTAN-HYDROCHLOROTHIAZIDE 80-12.5 MG PO TABS: 1.0000 | ORAL_TABLET | Freq: Every day | ORAL | 0 refills | Status: DC

## 2017-06-04 ENCOUNTER — Other Ambulatory Visit: Payer: Self-pay | Admitting: *Deleted

## 2017-06-04 MED ORDER — ATORVASTATIN CALCIUM 20 MG PO TABS: 20.0000 mg | ORAL_TABLET | Freq: Every day | ORAL | 1 refills | Status: DC

## 2017-06-05 ENCOUNTER — Other Ambulatory Visit: Payer: Self-pay | Admitting: Physician Assistant

## 2017-06-15 ENCOUNTER — Encounter: Payer: Self-pay | Admitting: Physician Assistant

## 2017-07-09 NOTE — Progress Notes (Signed)
[facility]   Unk Pinto, M.D.     Arthur Garcia. Silverio Lay, P.A.-C Liane Comber, [place]                [address] [place] [address] Telephone [phone] Telefax [phone] Comprehensive Evaluation & Examination     This very nice 66 y.o. MWM presents for a  comprehensive evaluation and management of multiple medical co-morbidities.  Patient has been followed for HTN, T2_NIDDM  Prediabetes, Hyperlipidemia and Vitamin D Deficiency.     HTN predates since 2001. Patient's BP has been controlled at home.  Today's BP is at goal - 132/84. Patient denies any cardiac symptoms as chest pain, palpitations, shortness of breath, dizziness or ankle swelling.     Patient's hyperlipidemia is controlled with diet and medications. Patient denies myalgias or other medication SE's. Last lipids were at goal: Lab Results  Component Value Date   CHOL 126 03/02/2017   HDL 44 03/02/2017   LDLCALC 66 03/02/2017   TRIG 79 03/02/2017   CHOLHDL 2.9 03/02/2017      Patient has prediabetes (A1c 5.9% in 2015 and 5.8% in 2016 and then A1c 5.6% in 2017)  and patient denies reactive hypoglycemic symptoms, visual blurring, diabetic polys or paresthesias. Last A1c was at goal: Lab Results  Component Value Date   HGBA1C 5.2 05/20/2016       Finally, patient has history of Vitamin D Deficiency and last vitamin D was at goal: Lab Results  Component Value Date   VD25OH 78 05/20/2016   Current Outpatient Prescriptions on File Prior to Visit  Medication Sig  . atorvastatin (LIPITOR) 20 MG tablet Take 1 tablet (20 mg total) by mouth daily. for cholesterol  . Cholecalciferol (VITAMIN D-3) 5000 UNITS TABS Take 5,000 Units by mouth.  . DICLOFENAC PO Take by mouth.  . gabapentin (NEURONTIN) 300 MG capsule Take 300 mg by mouth 2 (two) times daily.  Marland Kitchen oxyCODONE-acetaminophen (PERCOCET/ROXICET) 5-325 MG tablet Take 2  tablets by mouth 2 (two) times daily.  . pantoprazole (PROTONIX) 40 MG tablet Take 1 tablet (40 mg total) by mouth 2 (two) times daily.  . tadalafil (CIALIS) 20 MG tablet Take 1 tablet (20 mg total) by mouth daily as needed (BPH).  Marland Kitchen TIZANIDINE HCL PO Take by mouth.  . traZODone (DESYREL) 50 MG tablet Take 1-2 tablets 30 minutes prior to bedtime.   No current facility-administered medications on file prior to visit.    No Known Allergies   Past Medical History:  Diagnosis Date  . Abnormal liver function test   . Anxiety   . Colon polyp   . DDD (degenerative disc disease)   . ED (erectile dysfunction) 04/05/2014  . GERD (gastroesophageal reflux disease)   . Hyperlipidemia   . Hypertension   . Insomnia   . Tobacco dependence    Health Maintenance  Topic Date Due  . TETANUS/TDAP  09/29/2016  . INFLUENZA VACCINE  04/29/2017  . COLONOSCOPY  07/22/2017  . PNA vac Low Risk Adult (2 of 2 - PPSV23) 10/06/2017  . Hepatitis C Screening  Completed   Immunization History  Administered Date(s) Administered  . Influenza Split 07/05/2014  . Influenza, High Dose Seasonal PF 08/21/2015  . Pneumococcal Conjugate-13 05/20/2016  . Pneumococcal Polysaccharide-23 10/06/2012  . Td 09/29/2006  . Zoster 08/21/2015   Past Surgical History:  Procedure Laterality  Date  . ADENOIDECTOMY     age 48  . APPENDECTOMY    . DENTAL SURGERY     Dental implants  . SPINE SURGERY  Aug 2016   Dr. Vertell Limber  . Uvuectomy    . VASECTOMY     Family History  Problem Relation Age of Onset  . Thyroid disease Mother   . Hypertension Mother   . Osteoporosis Mother   . Glaucoma Mother   . Thyroid disease Father   . Hypertension Father   . Hyperlipidemia Father   . Heart disease Father        CHF  . Heart disease Paternal Grandfather 89       Long standing "heart trouble"   Social History   Social History  . Marital status: Married    Spouse name: N/A  . Number of children: 2  . Years of education: N/A    Occupational History  . Retired Systems developer [employer]    Social History Main Topics  . Smoking status: Current Every Day Smoker    Packs/day: 0.50    Years: 44.00    Types: Cigarettes  . Smokeless tobacco: Never Used     Comment: On and off  . Alcohol use 9.0 oz/week    15 Cans of beer per week  . Drug use: No  . Sexual activity: Not on file   Social History Narrative   One grandchild.  Works on farm Engineer, structural.      ROS Constitutional: Denies fever, chills, weight loss/gain, headaches, insomnia,  night sweats or change in appetite. Does c/o fatigue. Eyes: Denies redness, blurred vision, diplopia, discharge, itchy or watery eyes.  ENT: Denies discharge, congestion, post nasal drip, epistaxis, sore throat, earache, hearing loss, dental pain, Tinnitus, Vertigo, Sinus pain or snoring.  Cardio: Denies chest pain, palpitations, irregular heartbeat, syncope, dyspnea, diaphoresis, orthopnea, PND, claudication or edema Respiratory: denies cough, dyspnea, DOE, pleurisy, hoarseness, laryngitis or wheezing.  Gastrointestinal: Denies dysphagia, heartburn, reflux, water brash, pain, cramps, nausea, vomiting, bloating, diarrhea, constipation, hematemesis, melena, hematochezia, jaundice or hemorrhoids Genitourinary: Denies dysuria, frequency, urgency, nocturia, hesitancy, discharge, hematuria or flank pain Musculoskeletal: Denies arthralgia, myalgia, stiffness, Jt. Swelling, pain, limp or strain/sprain. Denies Falls. Skin: Denies puritis, rash, hives, warts, acne, eczema or change in skin lesion Neuro: No weakness, tremor, incoordination, spasms, paresthesia or pain Psychiatric: Denies confusion, memory loss or sensory loss. Denies Depression. Endocrine: Denies change in weight, skin, hair change, nocturia, and paresthesia, diabetic polys, visual blurring or hyper / hypo glycemic episodes.  Heme/Lymph: No excessive bleeding, bruising or enlarged lymph nodes.  Physical Exam  BP 132/84    Pulse 76   Temp 98.2 F (36.8 C)   Resp 18   Ht 6' 1.75" (1.873 m)   Wt 198 lb 3.2 oz (89.9 kg)   BMI 25.62 kg/m   General Appearance: Well nourished and well groomed and in no apparent distress.  Eyes: PERRLA, EOMs, conjunctiva no swelling or erythema, normal fundi and vessels. Sinuses: No frontal/maxillary tenderness ENT/Mouth: EACs patent / TMs  nl. Nares clear without erythema, swelling, mucoid exudates. Oral hygiene is good. No erythema, swelling, or exudate. Tongue normal, non-obstructing. Tonsils not swollen or erythematous. Hearing normal.  Neck: Supple, thyroid normal. No bruits, nodes or JVD. Respiratory: Respiratory effort normal.  BS equal and clear bilateral without rales, rhonci, wheezing or stridor. Cardio: Heart sounds are normal with regular rate and rhythm and no murmurs, rubs or gallops. Peripheral pulses are normal and equal bilaterally  without edema. No aortic or femoral bruits. Chest: symmetric with normal excursions and percussion.  Abdomen: Soft, with Nl bowel sounds. Nontender, no guarding, rebound, hernias, masses, or organomegaly.  Lymphatics: Non tender without lymphadenopathy.  Genitourinary: No hernias.Testes nl. DRE - prostate nl for age - smooth & firm w/o nodules. Musculoskeletal: Full ROM all peripheral extremities, joint stability, 5/5 strength, and normal gait. Skin: Warm and dry without rashes, lesions, cyanosis, clubbing or  ecchymosis.  Neuro: Cranial nerves intact, reflexes equal bilaterally. Normal muscle tone, no cerebellar symptoms. Sensation intact.  Pysch: Alert and oriented X 3 with normal affect, insight and judgment appropriate.   Assessment and Plan  1. Essential hypertension  - EKG 12-Lead - [place], RETROPERITNL ABD,  LTD - Urinalysis, Routine w reflex microscopic - Microalbumin / creatinine urine ratio - CBC with Differential/Platelet - BASIC METABOLIC PANEL WITH GFR - Magnesium - TSH - D/C Valsartan  - losartan (COZAAR) 25 MG  tablet; Take 1 tablet daily for BP  Dispense: 90 tablet; Refill: 3  2. Mixed hyperlipidemia  - EKG 12-Lead - [place], RETROPERITNL ABD,  LTD - Hepatic function panel - Lipid panel  3. Prediabetes  - EKG 12-Lead - [place], RETROPERITNL ABD,  LTD - Hemoglobin A1c - Insulin, random  4. Vitamin D deficiency  - VITAMIN D 25 Hydrox  5. Other abnormal glucose  - Hemoglobin A1c - Insulin, random  6. Screening for AAA (abdominal aortic aneurysm)  - [place], RETROPERITNL ABD,  LTD  7. Screening for ischemic heart disease  - EKG 12-Lead  8. Screening for malignant neoplasm of prostate  - PSA  9. Benign localized prostatic hyperplasia with lower urinary tract symptoms (LUTS)  - PSA  10. Screening for colorectal cancer  - POC Hemoccult Bld/Stl   11. Medication management  - Urinalysis, Routine w reflex microscopic - Microalbumin / creatinine urine ratio - CBC with Differential/Platelet - BASIC METABOLIC PANEL WITH GFR - Hepatic function panel - Magnesium - Lipid panel - TSH - Hemoglobin A1c - Insulin, random - VITAMIN D 25 Hydroxy        Patient was counseled in prudent diet, weight control to achieve/maintain BMI less than 25, BP monitoring, regular exercise and medications as discussed.  Discussed med effects and SE's. Routine screening labs and tests as requested with regular follow-up as recommended. [name], counseling, chart review and high complex critical decision making was performed

## 2017-07-09 NOTE — Patient Instructions (Signed)

## 2017-07-10 ENCOUNTER — Encounter: Payer: Self-pay | Admitting: Internal Medicine

## 2017-07-10 ENCOUNTER — Ambulatory Visit (INDEPENDENT_AMBULATORY_CARE_PROVIDER_SITE_OTHER): Payer: Medicare Other | Admitting: Internal Medicine

## 2017-07-10 VITALS — BP 132/84 | HR 76 | Temp 98.2°F | Resp 18 | Ht 73.75 in | Wt 198.2 lb

## 2017-07-10 DIAGNOSIS — E782 Mixed hyperlipidemia: Secondary | ICD-10-CM | POA: Diagnosis not present

## 2017-07-10 DIAGNOSIS — E559 Vitamin D deficiency, unspecified: Secondary | ICD-10-CM

## 2017-07-10 DIAGNOSIS — R7303 Prediabetes: Secondary | ICD-10-CM

## 2017-07-10 DIAGNOSIS — Z125 Encounter for screening for malignant neoplasm of prostate: Secondary | ICD-10-CM | POA: Diagnosis not present

## 2017-07-10 DIAGNOSIS — Z1212 Encounter for screening for malignant neoplasm of rectum: Secondary | ICD-10-CM

## 2017-07-10 DIAGNOSIS — Z136 Encounter for screening for cardiovascular disorders: Secondary | ICD-10-CM | POA: Diagnosis not present

## 2017-07-10 DIAGNOSIS — R7309 Other abnormal glucose: Secondary | ICD-10-CM | POA: Diagnosis not present

## 2017-07-10 DIAGNOSIS — Z23 Encounter for immunization: Secondary | ICD-10-CM | POA: Diagnosis not present

## 2017-07-10 DIAGNOSIS — Z79899 Other long term (current) drug therapy: Secondary | ICD-10-CM

## 2017-07-10 DIAGNOSIS — N401 Enlarged prostate with lower urinary tract symptoms: Secondary | ICD-10-CM | POA: Diagnosis not present

## 2017-07-10 DIAGNOSIS — I10 Essential (primary) hypertension: Secondary | ICD-10-CM

## 2017-07-10 DIAGNOSIS — Z1211 Encounter for screening for malignant neoplasm of colon: Secondary | ICD-10-CM

## 2017-07-10 MED ORDER — LOSARTAN POTASSIUM 25 MG PO TABS: ORAL_TABLET | ORAL | 3 refills | Status: DC

## 2017-07-13 LAB — CBC WITH DIFFERENTIAL/PLATELET
Basophils Absolute: 50 cells/uL (ref 0–200)
Basophils Relative: 1.2 %
Eosinophils Absolute: 202 cells/uL (ref 15–500)
Eosinophils Relative: 4.8 %
HCT: 40.6 % (ref 38.5–50.0)
Hemoglobin: 14.3 g/dL (ref 13.2–17.1)
Lymphs Abs: 1277 cells/uL (ref 850–3900)
MCH: 31.7 pg (ref 27.0–33.0)
MCHC: 35.2 g/dL (ref 32.0–36.0)
MCV: 90 fL (ref 80.0–100.0)
MPV: 10.3 fL (ref 7.5–12.5)
Monocytes Relative: 8.4 %
Neutro Abs: 2318 cells/uL (ref 1500–7800)
Neutrophils Relative %: 55.2 %
Platelets: 229 10*3/uL (ref 140–400)
RBC: 4.51 10*6/uL (ref 4.20–5.80)
RDW: 11.9 % (ref 11.0–15.0)
Total Lymphocyte: 30.4 %
WBC mixed population: 353 cells/uL (ref 200–950)
WBC: 4.2 10*3/uL (ref 3.8–10.8)

## 2017-07-13 LAB — LIPID PANEL
Cholesterol: 124 mg/dL (ref <200)
HDL: 49 mg/dL (ref >40)
LDL Cholesterol (Calc): 61 mg/dL (calc)
Non-HDL Cholesterol (Calc): 75 mg/dL (calc) (ref <130)
Total CHOL/HDL Ratio: 2.5 (calc) (ref <5.0)
Triglycerides: 59 mg/dL (ref <150)

## 2017-07-13 LAB — BASIC METABOLIC PANEL WITH GFR
BUN: 15 mg/dL (ref 7–25)
CO2: 28 mmol/L (ref 20–32)
Calcium: 9.4 mg/dL (ref 8.6–10.3)
Chloride: 99 mmol/L (ref 98–110)
Creat: 1.17 mg/dL (ref 0.70–1.25)
GFR, Est African American: 75 mL/min/{1.73_m2} (ref >=60)
GFR, Est Non African American: 65 mL/min/{1.73_m2} (ref >=60)
Glucose, Bld: 104 mg/dL — ABNORMAL HIGH (ref 65–99)
Potassium: 4.2 mmol/L (ref 3.5–5.3)
Sodium: 135 mmol/L (ref 135–146)

## 2017-07-13 LAB — HEMOGLOBIN A1C
Hgb A1c MFr Bld: 5.4 % of total Hgb (ref <5.7)
Mean Plasma Glucose: 108 (calc)
eAG (mmol/L): 6 (calc)

## 2017-07-13 LAB — URINALYSIS, ROUTINE W REFLEX MICROSCOPIC
Bilirubin Urine: NEGATIVE
Glucose, UA: NEGATIVE
Hgb urine dipstick: NEGATIVE
Ketones, ur: NEGATIVE
Leukocytes, UA: NEGATIVE
Nitrite: NEGATIVE
Protein, ur: NEGATIVE
Specific Gravity, Urine: 1.01 (ref 1.001–1.03)
pH: 5.5 (ref 5.0–8.0)

## 2017-07-13 LAB — INSULIN, RANDOM: Insulin: 4.7 u[IU]/mL (ref 2.0–19.6)

## 2017-07-13 LAB — HEPATIC FUNCTION PANEL
AG Ratio: 1.9 (calc) (ref 1.0–2.5)
ALT: 18 U/L (ref 9–46)
AST: 19 U/L (ref 10–35)
Albumin: 4.5 g/dL (ref 3.6–5.1)
Alkaline phosphatase (APISO): 67 U/L (ref 40–115)
Bilirubin, Direct: 0.1 mg/dL (ref 0.0–0.2)
Globulin: 2.4 g/dL (calc) (ref 1.9–3.7)
Indirect Bilirubin: 0.5 mg/dL (calc) (ref 0.2–1.2)
Total Bilirubin: 0.6 mg/dL (ref 0.2–1.2)
Total Protein: 6.9 g/dL (ref 6.1–8.1)

## 2017-07-13 LAB — VITAMIN D 25 HYDROXY (VIT D DEFICIENCY, FRACTURES): Vit D, 25-Hydroxy: 75 ng/mL (ref 30–100)

## 2017-07-13 LAB — MAGNESIUM: Magnesium: 1.7 mg/dL (ref 1.5–2.5)

## 2017-07-13 LAB — MICROALBUMIN / CREATININE URINE RATIO
Creatinine, Urine: 73 mg/dL (ref 20–320)
Microalb Creat Ratio: 5 mcg/mg creat (ref <30)
Microalb, Ur: 0.4 mg/dL

## 2017-07-13 LAB — PSA: PSA: 2.3 ng/mL (ref <=4.0)

## 2017-07-13 LAB — TSH: TSH: 0.95 mIU/L (ref 0.40–4.50)

## 2017-07-13 NOTE — Addendum Note (Signed)
Addended by: Melbourne Abts C on: 07/13/2017 09:47 AM   Modules accepted: Orders

## 2017-07-27 DIAGNOSIS — M545 Low back pain: Secondary | ICD-10-CM | POA: Diagnosis not present

## 2017-07-27 DIAGNOSIS — M5417 Radiculopathy, lumbosacral region: Secondary | ICD-10-CM | POA: Diagnosis not present

## 2017-07-27 DIAGNOSIS — I10 Essential (primary) hypertension: Secondary | ICD-10-CM | POA: Diagnosis not present

## 2017-07-27 DIAGNOSIS — Z6825 Body mass index (BMI) 25.0-25.9, adult: Secondary | ICD-10-CM | POA: Diagnosis not present

## 2017-08-04 ENCOUNTER — Other Ambulatory Visit: Payer: Self-pay | Admitting: Internal Medicine

## 2017-08-11 DIAGNOSIS — Z1211 Encounter for screening for malignant neoplasm of colon: Secondary | ICD-10-CM | POA: Diagnosis not present

## 2017-08-11 DIAGNOSIS — Z1212 Encounter for screening for malignant neoplasm of rectum: Secondary | ICD-10-CM | POA: Diagnosis not present

## 2017-08-18 DIAGNOSIS — M545 Low back pain: Secondary | ICD-10-CM | POA: Diagnosis not present

## 2017-08-18 DIAGNOSIS — M5137 Other intervertebral disc degeneration, lumbosacral region: Secondary | ICD-10-CM | POA: Diagnosis not present

## 2017-08-27 LAB — COLOGUARD: Cologuard: NEGATIVE

## 2017-08-28 ENCOUNTER — Other Ambulatory Visit: Payer: Self-pay | Admitting: Physician Assistant

## 2017-09-01 ENCOUNTER — Encounter: Payer: Self-pay | Admitting: *Deleted

## 2017-09-15 DIAGNOSIS — N5201 Erectile dysfunction due to arterial insufficiency: Secondary | ICD-10-CM | POA: Diagnosis not present

## 2017-10-27 DIAGNOSIS — N182 Chronic kidney disease, stage 2 (mild): Secondary | ICD-10-CM | POA: Insufficient documentation

## 2017-10-27 NOTE — Progress Notes (Signed)
MEDICARE ANNUAL WELLNESS VISIT AND FOLLOW UP Assessment:   Diagnoses and all orders for this visit:  Medicare annual wellness visit, subsequent  Essential hypertension Above goal; increase losartan to 50 mg daliy Monitor blood pressure at home; call if consistently over 130/80 Continue DASH diet.   Reminder to go to the ER if any CP, SOB, nausea, dizziness, severe HA, changes vision/speech, left arm numbness and tingling and jaw pain.  Other male erectile dysfunction Treated by Cialis PRN; no concerns or complications  Hyperlipidemia, unspecified hyperlipidemia type Well controlled; continue statin medication Continue low cholesterol diet and exercise.  -     Lipid panel -     TSH  TOBACCO ABUSE Discussed risks associated with tobacco use and advised to reduce or quit Patient is not ready to do so, but advised to consider strongly - he is willing to take home chantix prescription and consider as he is in contemplation - information provided Will follow up at the next visit  Right bundle branch block (RBBB) with left anterior fascicular block (LAFB) Evaluated by cardiology; no further interventions recommended unless progresses and symptomatic Continue annual EKG and as indicated  Other abnormal glucose A1Cs recently well controlled; previously prediabetic Discussed disease and risks Discussed diet/exercise, weight management  -     BASIC METABOLIC PANEL WITH GFR  Vitamin D deficiency At goal; continue supplementation Defer vitamin D level to next visit  Medication management -     CBC with Differential/Platelet -     BASIC METABOLIC PANEL WITH GFR -     Hepatic function panel  CKD (chronic kidney disease) stage 2, GFR 60-89 ml/min Increase fluids, avoid NSAIDS, monitor sugars, will monitor -     BASIC METABOLIC PANEL WITH GFR  GERD Well managed on current medications - protonix 40 mg PRN only  Discussed diet, avoiding triggers and other lifestyle changes   Over  30 minutes of exam, counseling, chart review, and critical decision making was performed  Future Appointments  Date Time Provider [place]  01/28/2018 10:30 AM Unk Pinto, MD [facility] None  08/16/2018 10:00 AM Unk Pinto, MD [facility] None     Plan:   During the course of the visit the patient was educated and counseled about appropriate screening and preventive services including:    Pneumococcal vaccine   Influenza vaccine  Prevnar 13  Td vaccine  Screening electrocardiogram  Colorectal cancer screening  Diabetes screening  Glaucoma screening  Nutrition counseling    Subjective:  Arthur Garcia is a 67 y.o. male who presents for Medicare Annual Wellness Visit and 3 month follow up for HTN, hyperlipidemia, glucose management (hx of prediabetes), and vitamin D Def. He has hx of DDD s/p lumbar discectomy x 2 by Dr. Vertell Limber in 2016 - he continues to experience pain and discomfort related to this, currently prescribed percocet and neurontin - is tapering down on these per patient preference.   he currently continues to smoke 1 pack a day; discussed risks associated with smoking, patient is not ready to quit - he is considering quitting or cutting back but is not ready to do so. He is agreeable to prescribing chantix today and holding to start this year sometime.   BMI is Body mass index is 25.81 kg/m., he has been working on diet and exercise. Wt Readings from Last 3 Encounters:  10/28/17 201 lb (91.2 kg)  07/10/17 198 lb 3.2 oz (89.9 kg)  03/02/17 190 lb 12.8 oz (86.5 kg)   He has  not been checking BPs at home regularly, today their BP is BP: (!) 152/84 He was switched from valsartan/hctz combo pill to losartan - has been taking 25 mg daily.  He does workout. He denies chest pain, shortness of breath, dizziness.   He is on cholesterol medication and denies myalgias. His cholesterol is at goal. The cholesterol last visit was:   Lab Results   Component Value Date   CHOL 124 07/10/2017   HDL 49 07/10/2017   LDLCALC 66 03/02/2017   TRIG 59 07/10/2017   CHOLHDL 2.5 07/10/2017   He has been working on diet and exercise for glucose management, and denies increased appetite, nausea, paresthesia of the feet, polydipsia, polyuria, visual disturbances and vomiting. Last A1C in the office was:  Lab Results  Component Value Date   HGBA1C 5.4 07/10/2017   Last GFR Lab Results  Component Value Date   GFRNONAA 65 07/10/2017    Patient is on Vitamin D supplement and at goal at most recent check:    Lab Results  Component Value Date   VD25OH 75 07/10/2017      Medication Review: Current Outpatient Medications on File Prior to Visit  Medication Sig Dispense Refill  . atorvastatin (LIPITOR) 20 MG tablet Take 1 tablet (20 mg total) by mouth daily. for cholesterol 90 tablet 1  . Cholecalciferol (VITAMIN D-3) 5000 UNITS TABS Take 10,000 Units by mouth.     . DICLOFENAC PO Take by mouth.    . gabapentin (NEURONTIN) 300 MG capsule Take 300 mg by mouth 2 (two) times daily.    Marland Kitchen oxyCODONE-acetaminophen (PERCOCET/ROXICET) 5-325 MG tablet Take 2 tablets by mouth 2 (two) times daily.    . pantoprazole (PROTONIX) 40 MG tablet Take 1 tablet (40 mg total) by mouth 2 (two) times daily. (Patient taking differently: Take 40 mg by mouth as needed. ) 180 tablet 0  . TIZANIDINE HCL PO Take by mouth.    . traZODone (DESYREL) 50 MG tablet TAKE 1-2 TABLETS BY MOUTH 30 MINUTES PRIOR TO BEDTIME. 180 tablet 1  . valsartan-hydrochlorothiazide (DIOVAN-HCT) 80-12.5 MG tablet TAKE 1 TABLET BY MOUTH DAILY FOR BLOOD PRESSURE 90 tablet 0  . losartan (COZAAR) 25 MG tablet Take 1 tablet daily for BP (Patient not taking: Reported on 10/28/2017) 90 tablet 3  . tadalafil (CIALIS) 20 MG tablet Take 1 tablet (20 mg total) by mouth daily as needed (BPH). (Patient not taking: Reported on 10/28/2017) 30 tablet 3   No current facility-administered medications on file prior to  visit.     Allergies: No Known Allergies  Current Problems (verified) has Hyperlipemia; TOBACCO ABUSE; ED (erectile dysfunction); Right bundle branch block (RBBB) with left anterior fascicular block (LAFB); HTN (hypertension); Other abnormal glucose; Vitamin D deficiency; Medication management; and CKD (chronic kidney disease) stage 2, GFR 60-89 ml/min on their problem list.  Screening Tests Immunization History  Administered Date(s) Administered  . Influenza Split 07/05/2014  . Influenza, High Dose Seasonal PF 08/21/2015  . Pneumococcal Conjugate-13 05/20/2016  . Pneumococcal Polysaccharide-23 10/06/2012  . Td 09/29/2006, 07/10/2017  . Zoster 08/21/2015   Preventative care: Last colonoscopy: 2008  Cologuard: 07/2017  Prior vaccinations: TD or Tdap: 2018  Influenza: 2016 DUE declines Pneumococcal: 2014 Prevnar13: 2017 Shingles/Zostavax: 2016  Names of Other Physician/Practitioners you currently use: 1. [facility] here for primary care 2. Dr. Delman Cheadle, eye doctor, last visit 03/2017 3. Dr. Lavone Neri, dentist, last visit 2018  Patient Care Team: Unk Pinto, MD as PCP -  General (Internal Medicine) Erline Levine, MD as Consulting Physician (Neurosurgery) Ladene Artist, MD as Consulting Physician (Gastroenterology) Sharyne Peach, MD as Consulting Physician (Ophthalmology) Minus Breeding, MD as Consulting Physician (Cardiology) Lowella Bandy, MD as Consulting Physician (Urology) Druscilla Brownie, MD as Consulting Physician (Dermatology)  Surgical: He  has a past surgical history that includes Uvuectomy; Appendectomy; Vasectomy; Adenoidectomy; Spine surgery (Aug 2016); and Dental surgery. Family His family history includes Glaucoma in his mother; Heart disease in his father; Heart disease (age of onset: 87) in his paternal grandfather; Hyperlipidemia in his father; Hypertension in his father and mother; Osteoporosis in his mother;  Thyroid disease in his father and mother. Social history  He reports that he has been smoking cigarettes.  He has a 44.00 pack-year smoking history. he has never used smokeless tobacco. He reports that he drinks about 12.0 oz of alcohol per week. He reports that he does not use drugs.  MEDICARE WELLNESS OBJECTIVES: Physical activity: Current Exercise Habits: Home exercise routine, Type of exercise: walking, Time (Minutes): 60, Frequency (Times/Week): 7, Weekly Exercise (Minutes/Week): 420, Intensity: Moderate, Exercise limited by: orthopedic condition(s) Cardiac risk factors: Cardiac Risk Factors include: advanced age (>40men, >4 women);dyslipidemia;hypertension;male gender;smoking/ tobacco exposure Depression/mood screen:   Depression screen [facility] 2/9 10/28/2017  Decreased Interest 0  Down, Depressed, Hopeless 0  PHQ - 2 Score 0    ADLs:  In your present state of health, do you have any difficulty performing the following activities: 10/28/2017 07/10/2017  Hearing? N N  Vision? N N  Difficulty concentrating or making decisions? N N  Walking or climbing stairs? N N  Dressing or bathing? N N  Doing errands, shopping? N N  Some recent data might be hidden     Cognitive Testing  Alert? Yes  Normal Appearance?Yes  Oriented to person? Yes  Place? Yes   Time? Yes  Recall of three objects?  Yes  Can perform simple calculations? Yes  Displays appropriate judgment?Yes  Can read the correct time from a watch face?Yes  EOL planning: Does Patient Have a Medical Advance Directive?: Yes Type of Advance Directive: Healthcare Power of Attorney, Living will Does patient want to make changes to medical advance directive?: No - Patient declined Copy of [place] in Chart?: No - copy requested Would patient like information on creating a medical advance directive?: No - Patient declined   Objective:   Today's Vitals   10/28/17 1044 10/28/17 1120  BP: (!) 160/80 (!) 152/84   Pulse: 66   Temp: 97.7 F (36.5 C)   SpO2: 96%   Weight: 201 lb (91.2 kg)   Height: 6\' 2"  (1.88 m)    Body mass index is 25.81 kg/m.  General appearance: alert, no distress, WD/WN, male HEENT: normocephalic, sclerae anicteric, TMs pearly, nares patent, no discharge or erythema, pharynx normal Oral cavity: MMM, no lesions Neck: supple, no lymphadenopathy, no thyromegaly, no masses Heart: RRR, normal S1, S2, no murmurs Lungs: CTA bilaterally, no wheezes, rhonchi, or rales Abdomen: +bs, soft, non tender, non distended, no masses, no hepatomegaly, no splenomegaly Musculoskeletal: nontender, no swelling, no obvious deformity Extremities: no edema, no cyanosis, no clubbing Pulses: 2+ symmetric, upper and lower extremities, normal cap refill Neurological: alert, oriented x 3, CN2-12 intact, strength normal upper extremities and lower extremities, sensation normal throughout, DTRs 2+ throughout, no cerebellar signs, gait normal Psychiatric: normal affect, behavior normal, pleasant   Medicare Attestation I have personally reviewed: The patient's medical and social history Their use of alcohol,  tobacco or illicit drugs Their current medications and supplements The patient's functional ability including ADLs,fall risks, home safety risks, cognitive, and hearing and visual impairment Diet and physical activities Evidence for depression or mood disorders  The patient's weight, height, BMI, and visual acuity have been recorded in the chart.  I have made referrals, counseling, and provided education to the patient based on review of the above and I have provided the patient with a written personalized care plan for preventive services.     Izora Ribas, NP   10/28/2017

## 2017-10-28 ENCOUNTER — Encounter: Payer: Self-pay | Admitting: Adult Health

## 2017-10-28 ENCOUNTER — Ambulatory Visit (INDEPENDENT_AMBULATORY_CARE_PROVIDER_SITE_OTHER): Payer: Medicare Other | Admitting: Adult Health

## 2017-10-28 VITALS — BP 152/84 | HR 66 | Temp 97.7°F | Ht 74.0 in | Wt 201.0 lb

## 2017-10-28 DIAGNOSIS — E559 Vitamin D deficiency, unspecified: Secondary | ICD-10-CM | POA: Diagnosis not present

## 2017-10-28 DIAGNOSIS — Z0001 Encounter for general adult medical examination with abnormal findings: Secondary | ICD-10-CM

## 2017-10-28 DIAGNOSIS — I10 Essential (primary) hypertension: Secondary | ICD-10-CM | POA: Diagnosis not present

## 2017-10-28 DIAGNOSIS — N528 Other male erectile dysfunction: Secondary | ICD-10-CM

## 2017-10-28 DIAGNOSIS — N182 Chronic kidney disease, stage 2 (mild): Secondary | ICD-10-CM | POA: Diagnosis not present

## 2017-10-28 DIAGNOSIS — K219 Gastro-esophageal reflux disease without esophagitis: Secondary | ICD-10-CM | POA: Diagnosis not present

## 2017-10-28 DIAGNOSIS — Z79899 Other long term (current) drug therapy: Secondary | ICD-10-CM | POA: Diagnosis not present

## 2017-10-28 DIAGNOSIS — I452 Bifascicular block: Secondary | ICD-10-CM

## 2017-10-28 DIAGNOSIS — R7309 Other abnormal glucose: Secondary | ICD-10-CM | POA: Diagnosis not present

## 2017-10-28 DIAGNOSIS — R6889 Other general symptoms and signs: Secondary | ICD-10-CM

## 2017-10-28 DIAGNOSIS — Z0000 Encounter for general adult medical examination without abnormal findings: Secondary | ICD-10-CM

## 2017-10-28 DIAGNOSIS — F17200 Nicotine dependence, unspecified, uncomplicated: Secondary | ICD-10-CM

## 2017-10-28 DIAGNOSIS — E785 Hyperlipidemia, unspecified: Secondary | ICD-10-CM

## 2017-10-28 LAB — BASIC METABOLIC PANEL WITH GFR
BUN: 18 mg/dL (ref 7–25)
CO2: 28 mmol/L (ref 20–32)
Calcium: 9.7 mg/dL (ref 8.6–10.3)
Chloride: 107 mmol/L (ref 98–110)
Creat: 1.11 mg/dL (ref 0.70–1.25)
GFR, Est African American: 79 mL/min/{1.73_m2} (ref >=60)
GFR, Est Non African American: 68 mL/min/{1.73_m2} (ref >=60)
Glucose, Bld: 106 mg/dL — ABNORMAL HIGH (ref 65–99)
Potassium: 4.6 mmol/L (ref 3.5–5.3)
Sodium: 139 mmol/L (ref 135–146)

## 2017-10-28 LAB — CBC WITH DIFFERENTIAL/PLATELET
Basophils Absolute: 72 cells/uL (ref 0–200)
Basophils Relative: 1.5 %
Eosinophils Absolute: 269 cells/uL (ref 15–500)
Eosinophils Relative: 5.6 %
HCT: 41.8 % (ref 38.5–50.0)
Hemoglobin: 14.4 g/dL (ref 13.2–17.1)
Lymphs Abs: 1296 cells/uL (ref 850–3900)
MCH: 31 pg (ref 27.0–33.0)
MCHC: 34.4 g/dL (ref 32.0–36.0)
MCV: 90.1 fL (ref 80.0–100.0)
MPV: 10.7 fL (ref 7.5–12.5)
Monocytes Relative: 8.5 %
Neutro Abs: 2755 cells/uL (ref 1500–7800)
Neutrophils Relative %: 57.4 %
Platelets: 216 10*3/uL (ref 140–400)
RBC: 4.64 10*6/uL (ref 4.20–5.80)
RDW: 11.9 % (ref 11.0–15.0)
Total Lymphocyte: 27 %
WBC mixed population: 408 cells/uL (ref 200–950)
WBC: 4.8 10*3/uL (ref 3.8–10.8)

## 2017-10-28 LAB — HEPATIC FUNCTION PANEL
AG Ratio: 1.8 (calc) (ref 1.0–2.5)
ALT: 25 U/L (ref 9–46)
AST: 19 U/L (ref 10–35)
Albumin: 4.4 g/dL (ref 3.6–5.1)
Alkaline phosphatase (APISO): 59 U/L (ref 40–115)
Bilirubin, Direct: 0.1 mg/dL (ref 0.0–0.2)
Globulin: 2.4 g/dL (calc) (ref 1.9–3.7)
Indirect Bilirubin: 0.4 mg/dL (calc) (ref 0.2–1.2)
Total Bilirubin: 0.5 mg/dL (ref 0.2–1.2)
Total Protein: 6.8 g/dL (ref 6.1–8.1)

## 2017-10-28 LAB — TSH: TSH: 1.02 mIU/L (ref 0.40–4.50)

## 2017-10-28 LAB — LIPID PANEL
Cholesterol: 136 mg/dL (ref <200)
HDL: 51 mg/dL (ref >40)
LDL Cholesterol (Calc): 71 mg/dL (calc)
Non-HDL Cholesterol (Calc): 85 mg/dL (calc) (ref <130)
Total CHOL/HDL Ratio: 2.7 (calc) (ref <5.0)
Triglycerides: 61 mg/dL (ref <150)

## 2017-10-28 MED ORDER — PANTOPRAZOLE SODIUM 40 MG PO TBEC
40.0000 mg | DELAYED_RELEASE_TABLET | ORAL | 1 refills | Status: DC | PRN
Start: 2017-10-28 — End: 2018-05-13

## 2017-10-28 MED ORDER — VARENICLINE TARTRATE 1 MG PO TABS
ORAL_TABLET | ORAL | 2 refills | Status: DC
Start: 2017-10-28 — End: 2018-05-13

## 2017-10-28 MED ORDER — LOSARTAN POTASSIUM 50 MG PO TABS: ORAL_TABLET | ORAL | 3 refills | Status: DC

## 2017-10-28 NOTE — Patient Instructions (Signed)
Increase losartan to 50 mg daily - will update prescription so when you pick up will be at new dose. Double up and take 2 tabs of 25 mg until then.   Monitor your blood pressure at home. Go to the ER if any CP, SOB, nausea, dizziness, severe HA, changes vision/speech  Goal BP:  For patients younger than 60: Goal BP < 140/90. - we'd love to see you below 130/80 For patients 60 and older: Goal BP < 150/90. For patients with diabetes: Goal BP < 140/90. Your most recent BP: BP: (!) 160/80   Take your medications faithfully as instructed. Maintain a healthy weight. Get at least 150 minutes of aerobic exercise per week. Minimize salt intake. Minimize alcohol intake  DASH Eating Plan DASH stands for "Dietary Approaches to Stop Hypertension." The DASH eating plan is a healthy eating plan that has been shown to reduce high blood pressure (hypertension). Additional health benefits may include reducing the risk of type 2 diabetes mellitus, heart disease, and stroke. The DASH eating plan may also help with weight loss. WHAT DO I NEED TO KNOW ABOUT THE DASH EATING PLAN? For the DASH eating plan, you will follow these general guidelines:  Choose foods with a percent daily value for sodium of less than 5% (as listed on the food label).  Use salt-free seasonings or herbs instead of table salt or sea salt.  Check with your health care provider or pharmacist before using salt substitutes.  Eat lower-sodium products, often labeled as "lower sodium" or "no salt added."  Eat fresh foods.  Eat more vegetables, fruits, and low-fat dairy products.  Choose whole grains. Look for the word "whole" as the first word in the ingredient list.  Choose fish and skinless chicken or [place] more often than red meat. Limit fish, poultry, and meat to 6 oz (170 g) each day.  Limit sweets, desserts, sugars, and sugary drinks.  Choose heart-healthy fats.  Limit cheese to 1 oz (28 g) per day.  Eat more home-cooked  food and less restaurant, buffet, and fast food.  Limit fried foods.  Cook foods using methods other than frying.  Limit canned vegetables. If you do use them, rinse them well to decrease the sodium.  When eating at a restaurant, ask that your food be prepared with less salt, or no salt if possible. WHAT FOODS CAN I EAT? Seek help from a dietitian for individual calorie needs. Grains Whole grain or whole wheat bread. Brown rice. Whole grain or whole wheat pasta. Quinoa, bulgur, and whole grain cereals. Low-sodium cereals. Corn or whole wheat flour tortillas. Whole grain cornbread. Whole grain crackers. Low-sodium crackers. Vegetables Fresh or frozen vegetables (raw, steamed, roasted, or grilled). Low-sodium or reduced-sodium tomato and vegetable juices. Low-sodium or reduced-sodium tomato sauce and paste. Low-sodium or reduced-sodium canned vegetables.  Fruits All fresh, canned (in natural juice), or frozen fruits. Meat and Other Protein Products Ground beef (85% or leaner), grass-fed beef, or beef trimmed of fat. Skinless chicken or [place]. Ground chicken or [place]. Pork trimmed of fat. All fish and seafood. Eggs. Dried beans, peas, or lentils. Unsalted nuts and seeds. Unsalted canned beans. Dairy Low-fat dairy products, such as skim or 1% milk, 2% or reduced-fat cheeses, low-fat ricotta or cottage cheese, or plain low-fat yogurt. Low-sodium or reduced-sodium cheeses. Fats and Oils Tub margarines without trans fats. Light or reduced-fat mayonnaise and salad dressings (reduced sodium). Avocado. Safflower, olive, or canola oils. Natural peanut or almond butter. Other Unsalted popcorn and pretzels.  The items listed above may not be a complete list of recommended foods or beverages. Contact your dietitian for more options. WHAT FOODS ARE NOT RECOMMENDED? Grains White bread. White pasta. White rice. Refined cornbread. Bagels and croissants. Crackers that contain trans  fat. Vegetables Creamed or fried vegetables. Vegetables in a cheese sauce. Regular canned vegetables. Regular canned tomato sauce and paste. Regular tomato and vegetable juices. Fruits Dried fruits. Canned fruit in light or heavy syrup. Fruit juice. Meat and Other Protein Products Fatty cuts of meat. Ribs, chicken wings, bacon, sausage, bologna, salami, chitterlings, fatback, hot dogs, bratwurst, and packaged luncheon meats. Salted nuts and seeds. Canned beans with salt. Dairy Whole or 2% milk, cream, half-and-half, and cream cheese. Whole-fat or sweetened yogurt. Full-fat cheeses or blue cheese. Nondairy creamers and whipped toppings. Processed cheese, cheese spreads, or cheese curds. Condiments Onion and garlic salt, seasoned salt, table salt, and sea salt. Canned and packaged gravies. Worcestershire sauce. Tartar sauce. Barbecue sauce. Teriyaki sauce. Soy sauce, including reduced sodium. Steak sauce. Fish sauce. Oyster sauce. Cocktail sauce. Horseradish. Ketchup and mustard. Meat flavorings and tenderizers. Bouillon cubes. Hot sauce. Tabasco sauce. Marinades. Taco seasonings. Relishes. Fats and Oils Butter, stick margarine, lard, shortening, ghee, and bacon fat. Coconut, palm kernel, or palm oils. Regular salad dressings. Other Pickles and olives. Salted popcorn and pretzels. The items listed above may not be a complete list of foods and beverages to avoid. Contact your dietitian for more information. WHERE CAN I FIND MORE INFORMATION? National Heart, Lung, and Blood Institute: travelstabloid.com Document Released: 09/04/2011 Document Revised: 01/30/2014 Document Reviewed: 07/20/2013 [facility] Patient Information 2015 [place], [place]. This information is not intended to replace advice given to you by your health care provider. Make sure you discuss any questions you have with your health care provider.

## 2017-11-16 DIAGNOSIS — M5137 Other intervertebral disc degeneration, lumbosacral region: Secondary | ICD-10-CM | POA: Diagnosis not present

## 2017-11-16 DIAGNOSIS — Z6826 Body mass index (BMI) 26.0-26.9, adult: Secondary | ICD-10-CM | POA: Diagnosis not present

## 2017-11-16 DIAGNOSIS — I10 Essential (primary) hypertension: Secondary | ICD-10-CM | POA: Diagnosis not present

## 2017-12-02 ENCOUNTER — Other Ambulatory Visit: Payer: Self-pay | Admitting: Internal Medicine

## 2018-01-09 IMAGING — MR MR HEAD WO/W CM
10 of 11 series · 32 of 48 positions shown · IV contrast (18ml Multihance)
Comparison: Cervical spine MRI 02/04/2015 and earlier.

CLINICAL DATA: 65-year-old male with bilateral hearing loss.
Patient reports chronic left side hearing loss. Initial encounter.

Creatinine was obtained on site at [HOSPITAL] at [HOSPITAL].
Results: Creatinine 0.9 mg/dL.
EXAM:
MRI HEAD WITHOUT AND WITH CONTRAST
TECHNIQUE: Multiplanar, multiecho pulse sequences of the brain and surrounding
structures were obtained without and with intravenous contrast.
CONTRAST:  18mL MULTIHANCE GADOBENATE DIMEGLUMINE 529 MG/ML IV SOLN

[Series 3: ep2d_diff_(id)_trace · axial · 3.0mm · 1.80mm/px · z∈[-53,+100]mm · 8 of 104 slices shown]
[im 1/104]
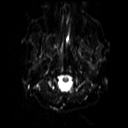
[im 16/104]
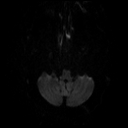
[im 32/104]
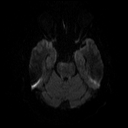
[im 48/104]
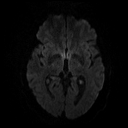
[im 56/104]
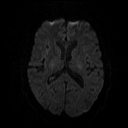
[im 72/104]
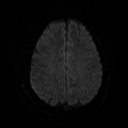
[im 88/104]
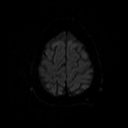
[im 104/104]
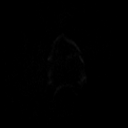

[Series 4: ep2d_diff_(id)_trace_adc · axial · 3.0mm · 1.80mm/px · z∈[-53,+100]mm · 7 of 49 slices shown]
[im 1/49]
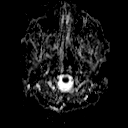
[im 9/49]
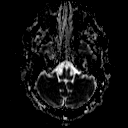
[im 17/49]
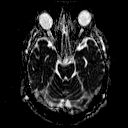
[im 25/49]
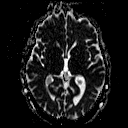
[im 33/49]
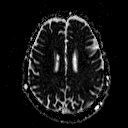
[im 41/49]
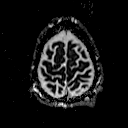
[im 49/49]
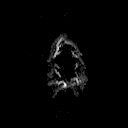

[Series 5: T1 · sagittal · 5.0mm · 0.47mm/px · 3 of 23 slices shown (1 of 4)]
[im 1/23]
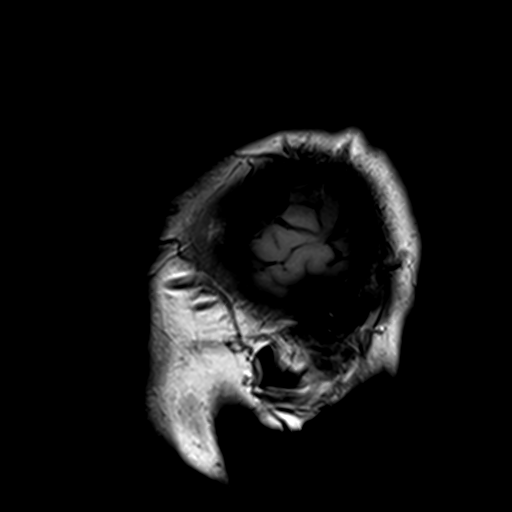
[im 12/23]
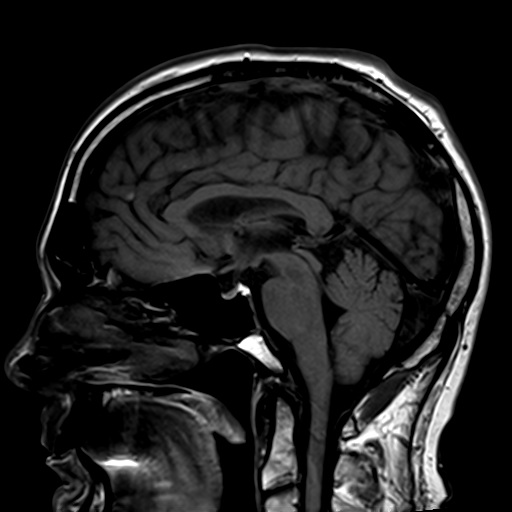
[im 23/23]
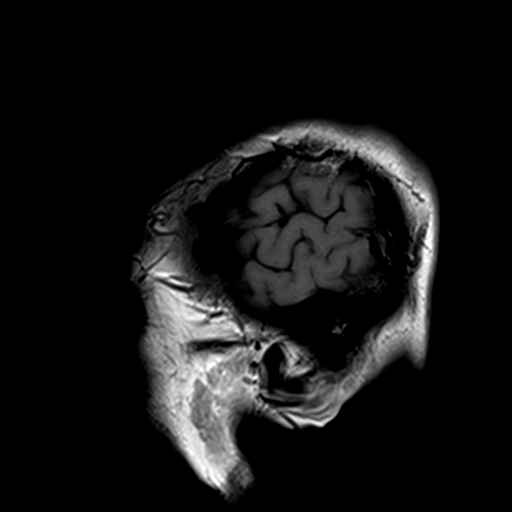

[Series 6: T2 · axial · 5.0mm · 0.47mm/px · z∈[-51,+99]mm · 3 of 25 slices shown]
[im 1/25]
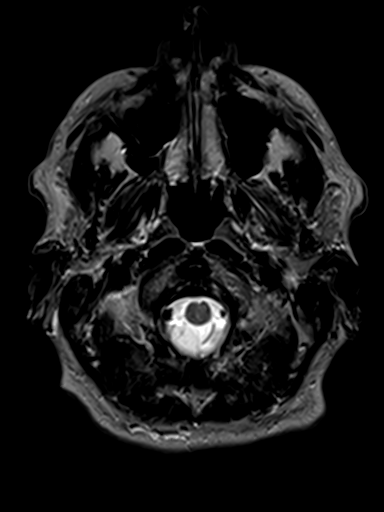
[im 13/25]
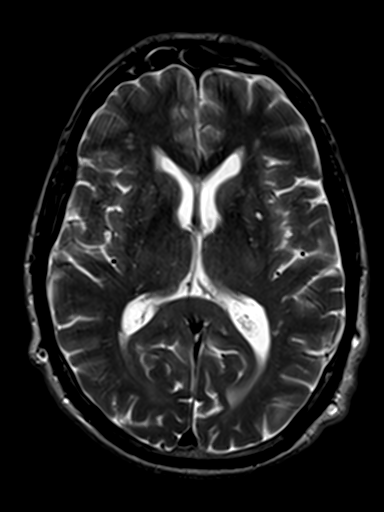
[im 25/25]
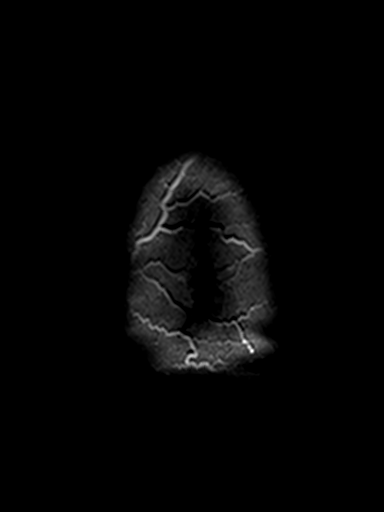

[Series 7: FLAIR · axial · 5.0mm · 0.45mm/px · z∈[-52,+98]mm · 3 of 25 slices shown]
[im 1/25]
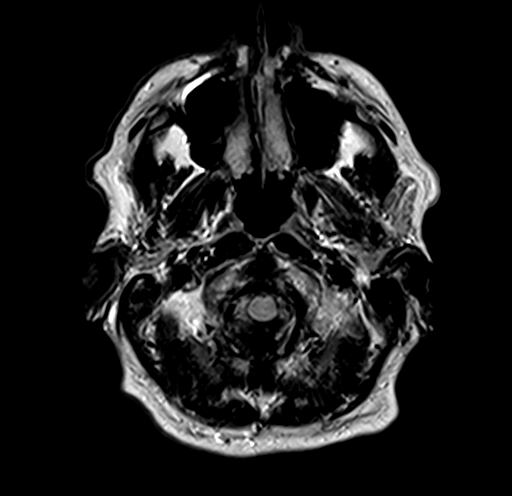
[im 13/25]
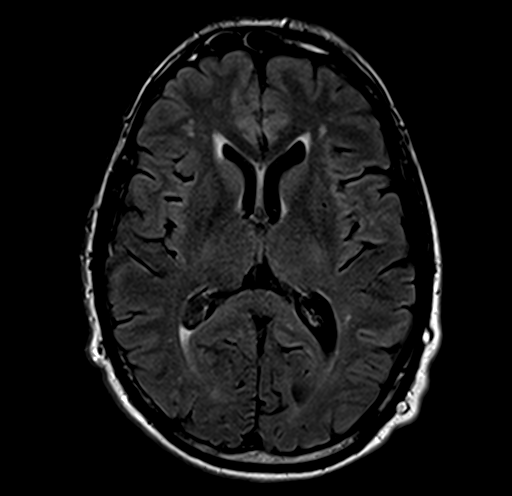
[im 25/25]
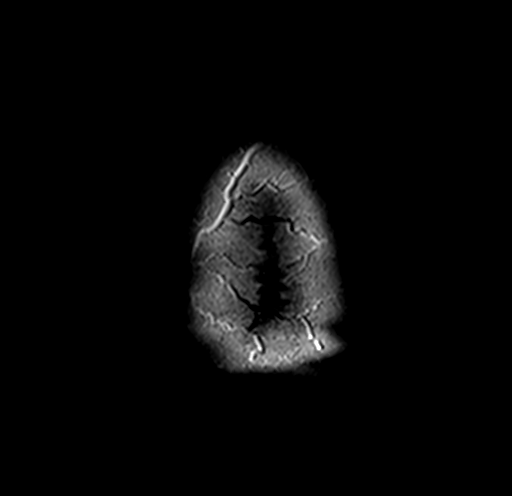

[Series 8: T1 · coronal · 3.0mm · 0.35mm/px · 1 of 11 slices shown (2 of 4)]
[im 1/11]
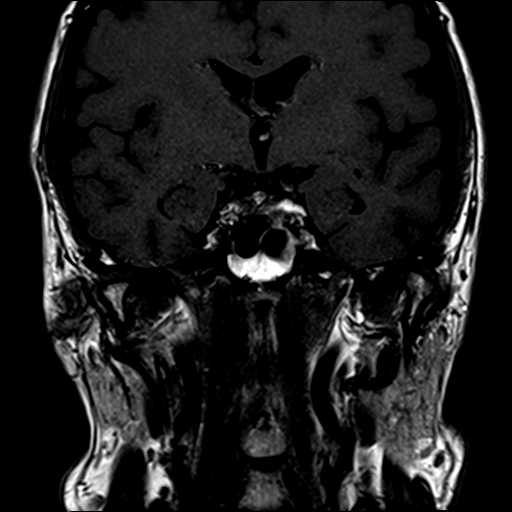

[Series 9: T1 · axial · 3.0mm · 0.35mm/px · 1 of 11 slices shown (3 of 4)]
[im 1/11]
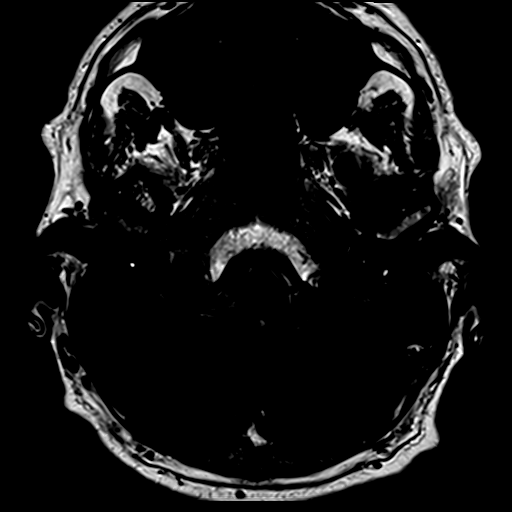

[Series 10: bSSFP · axial · 0.7mm · 0.28mm/px · z∈[-37,-15]mm · 4 of 44 slices shown]
[im 1/44]
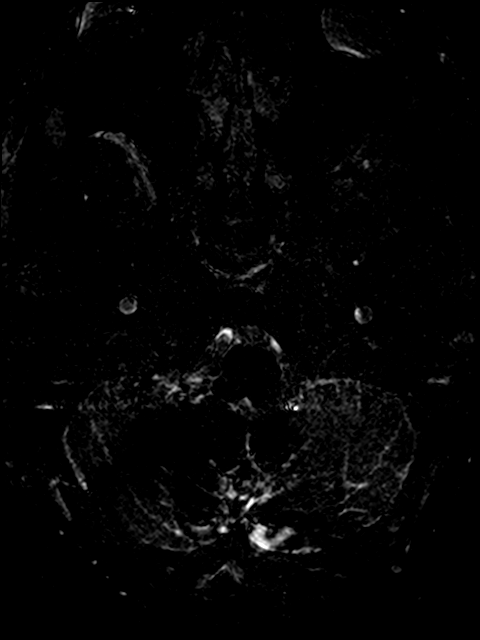
[im 11/44]
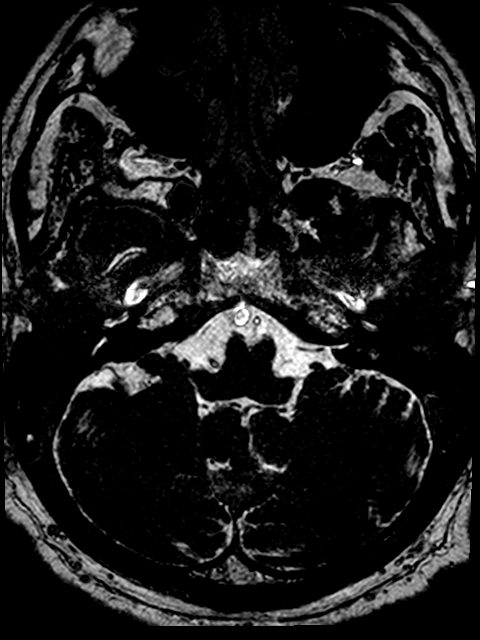
[im 22/44]
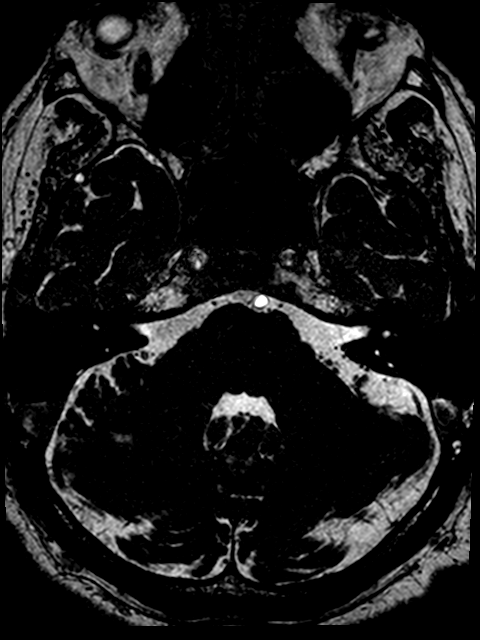
[im 33/44]
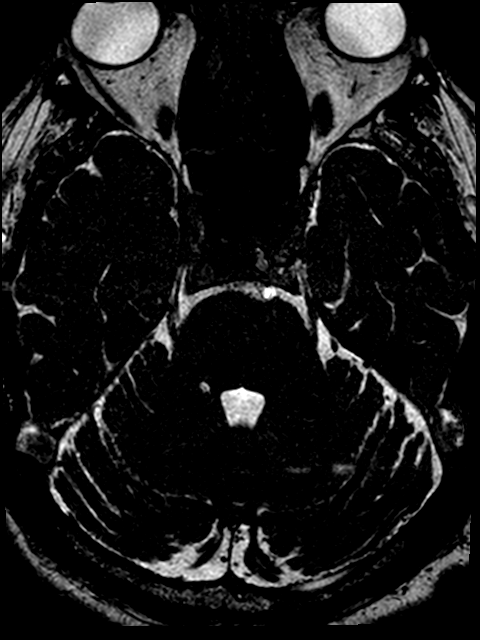

[Series 11: T1 · coronal · 3.0mm · 0.35mm/px · 1 of 11 slices shown (4 of 4)]
[im 1/11]
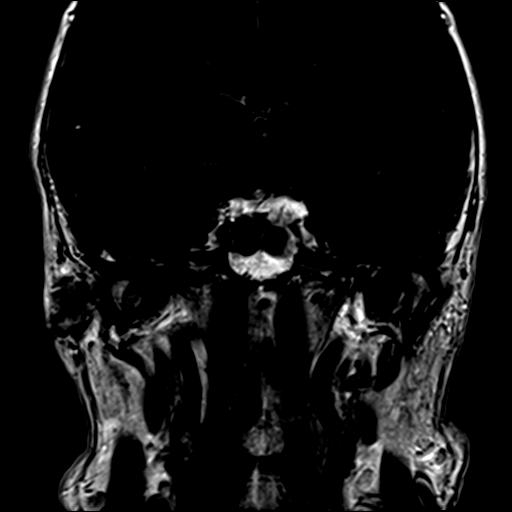

[Series 12: T1 post-contrast · axial · 3.0mm · 0.35mm/px · 1 of 11 slices shown]
[im 1/11]
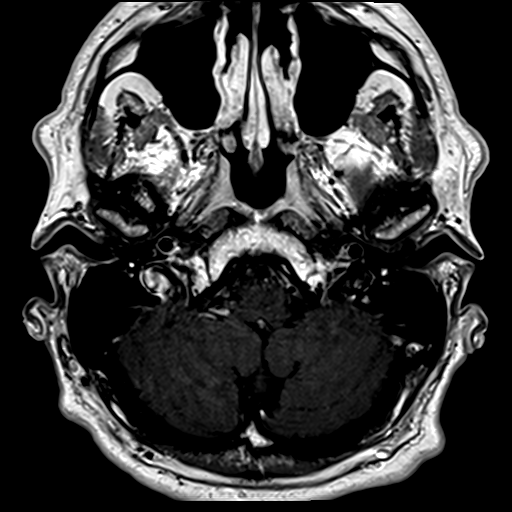

[32 of 48 positions shown; findings below may reference images not displayed]

FINDINGS: Brain: Cerebral volume is within normal limits for age. No
restricted diffusion to suggest acute infarction. No midline shift,
mass effect, evidence of mass lesion, ventriculomegaly, extra-axial
collection or acute intracranial hemorrhage. Cervicomedullary
junction and pituitary are within normal limits.

Scattered small mostly subcortical cerebral white matter T2 and
FLAIR hyperintensity. Extent is mild to moderate for age. No
cortical encephalomalacia identified. Deep gray matter nuclei appear
normal. Minimal patchy T2 and FLAIR hyperintensity in the pons.
Negative cerebellum. No abnormal enhancement identified. No dural
thickening.

Vascular: Major intracranial vascular flow voids are preserved. The
distal right vertebral artery appears dominant.

Skull and upper cervical spine: Negative. Normal bone marrow signal.

Sinuses/Orbits: Trace paranasal sinus mucosal thickening. Trace
retained secretions in the nasopharynx. Negative orbits soft
tissues.

Negative scalp soft tissues.

Other: Dedicated internal auditory canal imaging.
Normal cerebellopontine angles; there is tortuosity of the right
AICA (series 10, image 13) which does exert mild mass effect on the
pontomedullary junction, but this does not definitely affect the
right seventh or eighth cranial nerve root entry zone. Otherwise the
bilateral cisternal and intracanalicular 7th and 8th cranial nerve
segments appear normal. No abnormal enhancement identified.
Preserved T2 signal in the bilateral cochlea and vestibular
structures. Mastoids are essentially clear; trace bilateral
posterior mastoid fluid. Normal stylomastoid foramina. The parotid
glands appear normal. No skullbase abnormality identified.
IMPRESSION: 1. Essentially negative IAC imaging.
2. No acute intracranial abnormality. Mild to moderate for age
nonspecific cerebral white matter signal changes, most commonly due
to chronic small vessel disease.

## 2018-01-28 ENCOUNTER — Ambulatory Visit (INDEPENDENT_AMBULATORY_CARE_PROVIDER_SITE_OTHER): Payer: Medicare Other | Admitting: Internal Medicine

## 2018-01-28 ENCOUNTER — Encounter: Payer: Self-pay | Admitting: Internal Medicine

## 2018-01-28 VITALS — BP 140/80 | HR 64 | Temp 97.7°F | Resp 14 | Ht 74.0 in | Wt 201.2 lb

## 2018-01-28 DIAGNOSIS — R7309 Other abnormal glucose: Secondary | ICD-10-CM

## 2018-01-28 DIAGNOSIS — E782 Mixed hyperlipidemia: Secondary | ICD-10-CM | POA: Diagnosis not present

## 2018-01-28 DIAGNOSIS — I10 Essential (primary) hypertension: Secondary | ICD-10-CM | POA: Diagnosis not present

## 2018-01-28 DIAGNOSIS — R7303 Prediabetes: Secondary | ICD-10-CM

## 2018-01-28 DIAGNOSIS — Z79899 Other long term (current) drug therapy: Secondary | ICD-10-CM | POA: Diagnosis not present

## 2018-01-28 DIAGNOSIS — E559 Vitamin D deficiency, unspecified: Secondary | ICD-10-CM

## 2018-01-28 DIAGNOSIS — F17200 Nicotine dependence, unspecified, uncomplicated: Secondary | ICD-10-CM

## 2018-01-28 DIAGNOSIS — Z8249 Family history of ischemic heart disease and other diseases of the circulatory system: Secondary | ICD-10-CM

## 2018-01-28 DIAGNOSIS — K219 Gastro-esophageal reflux disease without esophagitis: Secondary | ICD-10-CM | POA: Diagnosis not present

## 2018-01-28 HISTORY — DX: Family history of ischemic heart disease and other diseases of the circulatory system: Z82.49

## 2018-01-28 LAB — CBC WITH DIFFERENTIAL/PLATELET
Basophils Absolute: 62 cells/uL (ref 0–200)
Basophils Relative: 1.5 %
Eosinophils Absolute: 230 cells/uL (ref 15–500)
Eosinophils Relative: 5.6 %
HCT: 39.2 % (ref 38.5–50.0)
Hemoglobin: 13.7 g/dL (ref 13.2–17.1)
Lymphs Abs: 1078 cells/uL (ref 850–3900)
MCH: 30.9 pg (ref 27.0–33.0)
MCHC: 34.9 g/dL (ref 32.0–36.0)
MCV: 88.3 fL (ref 80.0–100.0)
MPV: 10.8 fL (ref 7.5–12.5)
Monocytes Relative: 8.3 %
Neutro Abs: 2390 cells/uL (ref 1500–7800)
Neutrophils Relative %: 58.3 %
Platelets: 227 10*3/uL (ref 140–400)
RBC: 4.44 10*6/uL (ref 4.20–5.80)
RDW: 11.9 % (ref 11.0–15.0)
Total Lymphocyte: 26.3 %
WBC mixed population: 340 cells/uL (ref 200–950)
WBC: 4.1 10*3/uL (ref 3.8–10.8)

## 2018-01-28 LAB — COMPLETE METABOLIC PANEL WITH GFR
AG Ratio: 1.9 (calc) (ref 1.0–2.5)
ALT: 20 U/L (ref 9–46)
AST: 22 U/L (ref 10–35)
Albumin: 4.4 g/dL (ref 3.6–5.1)
Alkaline phosphatase (APISO): 64 U/L (ref 40–115)
BUN: 16 mg/dL (ref 7–25)
CO2: 28 mmol/L (ref 20–32)
Calcium: 9.3 mg/dL (ref 8.6–10.3)
Chloride: 103 mmol/L (ref 98–110)
Creat: 1.14 mg/dL (ref 0.70–1.25)
GFR, Est African American: 77 mL/min/{1.73_m2} (ref >=60)
GFR, Est Non African American: 66 mL/min/{1.73_m2} (ref >=60)
Globulin: 2.3 g/dL (calc) (ref 1.9–3.7)
Glucose, Bld: 111 mg/dL — ABNORMAL HIGH (ref 65–99)
Potassium: 4.6 mmol/L (ref 3.5–5.3)
Sodium: 137 mmol/L (ref 135–146)
Total Bilirubin: 0.6 mg/dL (ref 0.2–1.2)
Total Protein: 6.7 g/dL (ref 6.1–8.1)

## 2018-01-28 LAB — LIPID PANEL
Cholesterol: 121 mg/dL (ref <200)
HDL: 42 mg/dL (ref >40)
LDL Cholesterol (Calc): 65 mg/dL (calc)
Non-HDL Cholesterol (Calc): 79 mg/dL (calc) (ref <130)
Total CHOL/HDL Ratio: 2.9 (calc) (ref <5.0)
Triglycerides: 61 mg/dL (ref <150)

## 2018-01-28 LAB — TSH: TSH: 1.02 mIU/L (ref 0.40–4.50)

## 2018-01-28 LAB — MAGNESIUM: Magnesium: 1.7 mg/dL (ref 1.5–2.5)

## 2018-01-28 NOTE — Progress Notes (Signed)
This very nice 67 y.o. MWM presents for 3 month follow up with HTN, HLD, Pre-Diabetes and Vitamin D Deficiency. Patient has GERD controlled on his meds.      Patient is treated for HTN (2001) & BP has been controlled at home. Today's BP is at goal - 140/80. Patient has had no complaints of any cardiac type chest pain, palpitations, dyspnea / orthopnea / PND, dizziness, claudication, or dependent edema.     Hyperlipidemia is controlled with diet & meds. Patient denies myalgias or other med SE's. Last Lipids were at goal: Lab Results  Component Value Date   CHOL 136 10/28/2017   HDL 51 10/28/2017   LDLCALC 71 10/28/2017   TRIG 61 10/28/2017   CHOLHDL 2.7 10/28/2017      Also, the patient has history of PreDiabetes (A1c 5.9%/2015, 5.8%/2016 and then 5.6%/2017)  and has had no symptoms of reactive hypoglycemia, diabetic polys, paresthesias or visual blurring.  Last A1c was Normal & at goal: Lab Results  Component Value Date   HGBA1C 5.4 07/10/2017      Further, the patient also has history of Vitamin D Deficiency and supplements vitamin D without any suspected side-effects. Last vitamin D was Normal & at goal: Lab Results  Component Value Date   VD25OH 9 07/10/2017   Current Outpatient Medications on File Prior to Visit  Medication Sig  . atorvastatin (LIPITOR) 20 MG tablet TAKE 1 TABLET (20 MG TOTAL) BY MOUTH DAILY. FOR CHOLESTEROL  . Cholecalciferol (VITAMIN D-3) 5000 UNITS TABS Take 10,000 Units by mouth.   . DICLOFENAC PO Take by mouth.  . gabapentin (NEURONTIN) 300 MG capsule Take 300 mg by mouth 2 (two) times daily.  Marland Kitchen losartan (COZAAR) 50 MG tablet Take 1 tablet daily for BP  . oxyCODONE-acetaminophen (PERCOCET/ROXICET) 5-325 MG tablet Take 2 tablets by mouth 2 (two) times daily.  . pantoprazole (PROTONIX) 40 MG tablet Take 1 tablet (40 mg total) by mouth as needed.  Marland Kitchen TIZANIDINE HCL PO Take by mouth.  . traZODone (DESYREL) 50 MG tablet TAKE 1-2 TABLETS BY MOUTH 30 MINUTES  PRIOR TO BEDTIME.  . varenicline (CHANTIX CONTINUING MONTH PAK) 1 MG tablet 1/2 tab daily after meal with full glass of water for 3 days, then twice daily for 4 days - take 1/2-1 tab twice daily thereafter.   No current facility-administered medications on file prior to visit.    No Known Allergies PMHx:   Past Medical History:  Diagnosis Date  . Abnormal liver function test   . Anxiety   . Colon polyp   . DDD (degenerative disc disease)   . ED (erectile dysfunction) 04/05/2014  . GERD (gastroesophageal reflux disease)   . Hyperlipidemia   . Hypertension   . Insomnia   . Tobacco dependence    Immunization History  Administered Date(s) Administered  . Influenza Split 07/05/2014  . Influenza, High Dose Seasonal PF 08/21/2015  . Pneumococcal Conjugate-13 05/20/2016  . Pneumococcal Polysaccharide-23 10/06/2012  . Td 09/29/2006, 07/10/2017  . Zoster 08/21/2015   Past Surgical History:  Procedure Laterality Date  . ADENOIDECTOMY     age 25  . APPENDECTOMY    . DENTAL SURGERY     Dental implants  . SPINE SURGERY  Aug 2016   Dr. Vertell Limber  . Uvuectomy    . VASECTOMY     FHx:    Reviewed / unchanged  SHx:    Reviewed / unchanged  Systems Review:  Constitutional: Denies fever, chills,  wt changes, headaches, insomnia, fatigue, night sweats, change in appetite. Eyes: Denies redness, blurred vision, diplopia, discharge, itchy, watery eyes.  ENT: Denies discharge, congestion, post nasal drip, epistaxis, sore throat, earache, hearing loss, dental pain, tinnitus, vertigo, sinus pain, snoring.  CV: Denies chest pain, palpitations, irregular heartbeat, syncope, dyspnea, diaphoresis, orthopnea, PND, claudication or edema. Respiratory: denies cough, dyspnea, DOE, pleurisy, hoarseness, laryngitis, wheezing.  Gastrointestinal: Denies dysphagia, odynophagia, heartburn, reflux, water brash, abdominal pain or cramps, nausea, vomiting, bloating, diarrhea, constipation, hematemesis, melena,  hematochezia  or hemorrhoids. Genitourinary: Denies dysuria, frequency, urgency, nocturia, hesitancy, discharge, hematuria or flank pain. Musculoskeletal: Denies arthralgias, myalgias, stiffness, jt. swelling, pain, limping or strain/sprain.  Skin: Denies pruritus, rash, hives, warts, acne, eczema or change in skin lesion(s). Neuro: No weakness, tremor, incoordination, spasms, paresthesia or pain. Psychiatric: Denies confusion, memory loss or sensory loss. Endo: Denies change in weight, skin or hair change.  Heme/Lymph: No excessive bleeding, bruising or enlarged lymph nodes.  Physical Exam  BP 140/80   Pulse 64   Temp 97.7 F (36.5 C)   Resp 14   Ht 6\' 2"  (1.88 m)   Wt 201 lb 3.2 oz (91.3 kg)   SpO2 97%   BMI 25.83 kg/m   Appears  well nourished, well groomed  and in no distress.  Eyes: PERRLA, EOMs, conjunctiva no swelling or erythema. Sinuses: No frontal/maxillary tenderness ENT/Mouth: EAC's clear, TM's nl w/o erythema, bulging. Nares clear w/o erythema, swelling, exudates. Oropharynx clear without erythema or exudates. Oral hygiene is good. Tongue normal, non obstructing. Hearing intact.  Neck: Supple. Thyroid not palpable. Car 2+/2+ without bruits, nodes or JVD. Chest: Respirations nl with BS clear & equal w/o rales, rhonchi, wheezing or stridor.  Cor: Heart sounds normal w/ regular rate and rhythm without sig. murmurs, gallops, clicks or rubs. Peripheral pulses normal and equal  without edema.  Abdomen: Soft & bowel sounds normal. Non-tender w/o guarding, rebound, hernias, masses or organomegaly.  Lymphatics: Unremarkable.  Musculoskeletal: Full ROM all peripheral extremities, joint stability, 5/5 strength and normal gait.  Skin: Warm, dry without exposed rashes, lesions or ecchymosis apparent.  Neuro: Cranial nerves intact, reflexes equal bilaterally. Sensory-motor testing grossly intact. Tendon reflexes grossly intact.  Pysch: Alert & oriented x 3.  Insight and judgement nl  & appropriate. No ideations.  Assessment and Plan:  1. Essential hypertension  - Continue medication, monitor blood pressure at home.  - Continue DASH diet.  Reminder to go to the ER if any CP,  SOB, nausea, dizziness, severe HA, changes vision/speech.  - CBC with Differential/Platelet - COMPLETE METABOLIC PANEL WITH GFR - Magnesium - TSH  2. Hyperlipidemia, mixed  - Continue diet/meds, exercise,& lifestyle modifications.  - Continue monitor periodic cholesterol/liver & renal functions   - Lipid panel  3. Abnormal glucose  - Continue diet, exercise, lifestyle modifications.  - Monitor appropriate labs. - Hemoglobin A1c - Insulin, random  4. Vitamin D deficiency  - Continue supplementation.   - VITAMIN D 25 Hydroxyl  5. Prediabetes  - Hemoglobin A1c - Insulin, random  6. Gastroesophageal reflux disease  - CBC with Differential/Platelet  7. Smoker   8. FHx: heart disease   9. Medication management  - CBC with Differential/Platelet - COMPLETE METABOLIC PANEL WITH GFR - Magnesium - Lipid panel - TSH - Hemoglobin A1c - Insulin, random - VITAMIN D 25 Hydroxyl              Discussed  regular exercise, BP monitoring, weight control to achieve/maintain BMI less  than 25 and discussed med and SE's. Recommended labs to assess and monitor clinical status with further disposition pending results of labs. Over 30 minutes of exam, counseling, chart review was performed.

## 2018-01-28 NOTE — Patient Instructions (Signed)

## 2018-01-29 LAB — VITAMIN D 25 HYDROXY (VIT D DEFICIENCY, FRACTURES): Vit D, 25-Hydroxy: 95 ng/mL (ref 30–100)

## 2018-01-29 LAB — HEMOGLOBIN A1C
Hgb A1c MFr Bld: 5.5 % of total Hgb (ref <5.7)
Mean Plasma Glucose: 111 (calc)
eAG (mmol/L): 6.2 (calc)

## 2018-01-29 LAB — INSULIN, RANDOM: Insulin: 4.4 u[IU]/mL (ref 2.0–19.6)

## 2018-01-30 ENCOUNTER — Encounter: Payer: Self-pay | Admitting: Internal Medicine

## 2018-01-30 MED ORDER — DICLOFENAC SODIUM 75 MG PO TBEC: DELAYED_RELEASE_TABLET | ORAL | 1 refills | Status: DC

## 2018-01-30 MED ORDER — TIZANIDINE HCL 4 MG PO TABS: ORAL_TABLET | ORAL | 0 refills | Status: DC

## 2018-02-01 ENCOUNTER — Other Ambulatory Visit: Payer: Self-pay | Admitting: Internal Medicine

## 2018-02-03 ENCOUNTER — Encounter: Payer: Self-pay | Admitting: Internal Medicine

## 2018-02-03 ENCOUNTER — Other Ambulatory Visit: Payer: Self-pay | Admitting: Internal Medicine

## 2018-02-03 MED ORDER — DICLOFENAC SODIUM 75 MG PO TBEC: DELAYED_RELEASE_TABLET | ORAL | 1 refills | Status: DC

## 2018-02-08 DIAGNOSIS — M5137 Other intervertebral disc degeneration, lumbosacral region: Secondary | ICD-10-CM | POA: Diagnosis not present

## 2018-02-08 DIAGNOSIS — M545 Low back pain: Secondary | ICD-10-CM | POA: Diagnosis not present

## 2018-02-11 ENCOUNTER — Other Ambulatory Visit: Payer: Self-pay | Admitting: *Deleted

## 2018-02-11 MED ORDER — DICLOFENAC SODIUM 75 MG PO TBEC: DELAYED_RELEASE_TABLET | ORAL | 1 refills | Status: DC

## 2018-04-24 ENCOUNTER — Other Ambulatory Visit: Payer: Self-pay | Admitting: Adult Health

## 2018-05-10 DIAGNOSIS — I10 Essential (primary) hypertension: Secondary | ICD-10-CM | POA: Diagnosis not present

## 2018-05-10 DIAGNOSIS — Z6825 Body mass index (BMI) 25.0-25.9, adult: Secondary | ICD-10-CM | POA: Diagnosis not present

## 2018-05-10 DIAGNOSIS — M545 Low back pain: Secondary | ICD-10-CM | POA: Diagnosis not present

## 2018-05-12 DIAGNOSIS — E663 Overweight: Secondary | ICD-10-CM | POA: Insufficient documentation

## 2018-05-12 NOTE — Progress Notes (Signed)
FOLLOW UP  Assessment and Plan:   Hypertension Severely elevated; previously well controlled on valsartan/hctz combo - will switch back, prescribed 160/25 dose, start with 1/2 tab daily, increase to full tab if needed for goal Monitor blood pressure at home; patient to call if consistently greater than 130/80 Continue DASH diet.   Reminder to go to the ER if any CP, SOB, nausea, dizziness, severe HA, changes vision/speech, left arm numbness and tingling and jaw pain.  Cholesterol Currently at goal; continue statin Continue low cholesterol diet and exercise.  Check lipid panel.   Other abnormal glucose Recent A1Cs at goal Discussed diet/exercise, weight management  Defer A1C; check CMP  BMI 25 Long discussion about weight loss, diet, and exercise Recommended diet heavy in fruits and veggies and low in animal meats, cheeses, and dairy products, appropriate calorie intake Discussed ideal weight for height  Will follow up in 3 months  Vitamin D Def At goal at last visit; continue supplementation to maintain goal of 70-100 Defer Vit D level  Tobacco use Discussed risks associated with tobacco use and advised to reduce or quit Patient is ambivalent about doing so but willing to have chantix prescription Prescription for chantix with information provided Will follow up at the next visit   Continue diet and meds as discussed. Further disposition pending results of labs. Discussed med's effects and SE's.   Over 30 minutes of exam, counseling, chart review, and critical decision making was performed.   Future Appointments  Date Time Provider [place]  08/16/2018 10:00 AM Unk Pinto, MD [id number] None    ----------------------------------------------------------------------------------------------------------------------  HPI 67 y.o. male  presents for 3 month follow up on hypertension, cholesterol, glucose mangement, weight and vitamin D deficiency.   he  currently continues to smoke 1 pack a day; discussed risks associated with smoking, patient is not ready to quit. He has been prescribed chantix but never took, but willing to have prescribed and hold in case he changes his mind.   BMI is Body mass index is 25.81 kg/m., he has been working on diet and exercise. Currently moving house.  Wt Readings from Last 3 Encounters:  05/13/18 201 lb (91.2 kg)  01/28/18 201 lb 3.2 oz (91.3 kg)  10/28/17 201 lb (91.2 kg)   His blood pressure has not been controlled at home (reports was similar 10 days ago at pain management), today their BP is BP: (!) 180/90  He does workout. He denies chest pain, shortness of breath, dizziness.   He is on cholesterol medication (atorvastatin 20 mg daily) and denies myalgias. His cholesterol is at goal. The cholesterol last visit was:   Lab Results  Component Value Date   CHOL 121 01/28/2018   HDL 42 01/28/2018   LDLCALC 65 01/28/2018   TRIG 61 01/28/2018   CHOLHDL 2.9 01/28/2018    He has been working on diet and exercise for glucose mangement, and denies foot ulcerations, increased appetite, nausea, paresthesia of the feet, polydipsia, polyuria, visual disturbances, vomiting and weight loss. Last A1C in the office was:  Lab Results  Component Value Date   HGBA1C 5.5 01/28/2018   Patient is on Vitamin D supplement.   Lab Results  Component Value Date   VD25OH 95 01/28/2018       Current Medications:  Current Outpatient Medications on File Prior to Visit  Medication Sig  . atorvastatin (LIPITOR) 20 MG tablet TAKE 1 TABLET (20 MG TOTAL) BY MOUTH DAILY. FOR CHOLESTEROL  . Cholecalciferol (VITAMIN D-3) 5000  UNITS TABS Take 10,000 Units by mouth.   . diclofenac (VOLTAREN) 75 MG EC tablet Take 1 tablet 3 x / day as needed for pain & Inflammation  . gabapentin (NEURONTIN) 300 MG capsule Take 300 mg by mouth 3 (three) times daily.   Marland Kitchen losartan (COZAAR) 50 MG tablet Take 1 tablet daily for BP  .  oxyCODONE-acetaminophen (PERCOCET/ROXICET) 5-325 MG tablet Take 1 tablet by mouth 2 (two) times daily.   . pantoprazole (PROTONIX) 40 MG tablet Take 1 tablet (40 mg total) by mouth as needed.  . traZODone (DESYREL) 50 MG tablet TAKE 1-2 TABLETS BY MOUTH 30 MINUTES PRIOR TO BEDTIME.  . varenicline (CHANTIX CONTINUING MONTH PAK) 1 MG tablet 1/2 tab daily after meal with full glass of water for 3 days, then twice daily for 4 days - take 1/2-1 tab twice daily thereafter.  Marland Kitchen tiZANidine (ZANAFLEX) 4 MG tablet Take 1/2 to 1 tablet 2 to 3 x / day for Muscle Spasm (Patient not taking: Reported on 05/13/2018)   No current facility-administered medications on file prior to visit.      Allergies: No Known Allergies   Medical History:  Past Medical History:  Diagnosis Date  . Abnormal liver function test   . Anxiety   . Colon polyp   . DDD (degenerative disc disease)   . ED (erectile dysfunction) 04/05/2014  . GERD (gastroesophageal reflux disease)   . Hyperlipidemia   . Hypertension   . Insomnia   . Tobacco dependence    Family history- Reviewed and unchanged Social history- Reviewed and unchanged   Review of Systems:  Review of Systems  Constitutional: Negative for malaise/fatigue and weight loss.  HENT: Negative for hearing loss and tinnitus.   Eyes: Negative for blurred vision and double vision.  Respiratory: Negative for cough, shortness of breath and wheezing.   Cardiovascular: Negative for chest pain, palpitations, orthopnea, claudication and leg swelling.  Gastrointestinal: Negative for abdominal pain, blood in stool, constipation, diarrhea, heartburn, melena, nausea and vomiting.  Genitourinary: Negative.   Musculoskeletal: Negative for joint pain and myalgias.  Skin: Negative for rash.  Neurological: Negative for dizziness, tingling, sensory change, weakness and headaches.  Endo/Heme/Allergies: Negative for polydipsia.  Psychiatric/Behavioral: Negative.   All other systems  reviewed and are negative.   Physical Exam: BP (!) 180/90   Pulse 71   Temp (!) 97.3 F (36.3 C)   Ht 6\' 2"  (1.88 m)   Wt 201 lb (91.2 kg)   SpO2 97%   BMI 25.81 kg/m  Wt Readings from Last 3 Encounters:  05/13/18 201 lb (91.2 kg)  01/28/18 201 lb 3.2 oz (91.3 kg)  10/28/17 201 lb (91.2 kg)   General Appearance: Well nourished, in no apparent distress. Eyes: PERRLA, EOMs, conjunctiva no swelling or erythema Sinuses: No Frontal/maxillary tenderness ENT/Mouth: Ext aud canals clear, TMs without erythema, bulging. No erythema, swelling, or exudate on post pharynx.  Tonsils not swollen or erythematous. Geographic tongue with soft dark brown geographic tongue irregularity without firm nodules or palpable growths. Hearing normal.  Neck: Supple, thyroid normal.  Respiratory: Respiratory effort normal, BS equal bilaterally without rales, rhonchi, wheezing or stridor.  Cardio: RRR with no MRGs. Brisk peripheral pulses without edema.  Abdomen: Soft, + BS.  Non tender, no guarding, rebound, hernias, masses. Lymphatics: Non tender without lymphadenopathy.  Musculoskeletal: Full ROM, 5/5 strength, Normal gait Skin: Warm, dry without rashes, lesions, ecchymosis.  Neuro: Cranial nerves intact. No cerebellar symptoms.  Psych: Awake and oriented X 3, normal  affect, Insight and Judgment appropriate.   Izora Ribas, NP 10:35 AM [facility]

## 2018-05-13 ENCOUNTER — Ambulatory Visit (INDEPENDENT_AMBULATORY_CARE_PROVIDER_SITE_OTHER): Payer: Medicare Other | Admitting: Adult Health

## 2018-05-13 ENCOUNTER — Encounter: Payer: Self-pay | Admitting: Adult Health

## 2018-05-13 VITALS — BP 180/90 | HR 71 | Temp 97.3°F | Ht 74.0 in | Wt 201.0 lb

## 2018-05-13 DIAGNOSIS — Z79899 Other long term (current) drug therapy: Secondary | ICD-10-CM

## 2018-05-13 DIAGNOSIS — R7309 Other abnormal glucose: Secondary | ICD-10-CM | POA: Diagnosis not present

## 2018-05-13 DIAGNOSIS — E559 Vitamin D deficiency, unspecified: Secondary | ICD-10-CM

## 2018-05-13 DIAGNOSIS — I10 Essential (primary) hypertension: Secondary | ICD-10-CM | POA: Diagnosis not present

## 2018-05-13 DIAGNOSIS — N182 Chronic kidney disease, stage 2 (mild): Secondary | ICD-10-CM

## 2018-05-13 DIAGNOSIS — K219 Gastro-esophageal reflux disease without esophagitis: Secondary | ICD-10-CM | POA: Diagnosis not present

## 2018-05-13 DIAGNOSIS — E782 Mixed hyperlipidemia: Secondary | ICD-10-CM | POA: Diagnosis not present

## 2018-05-13 DIAGNOSIS — F17200 Nicotine dependence, unspecified, uncomplicated: Secondary | ICD-10-CM

## 2018-05-13 DIAGNOSIS — E663 Overweight: Secondary | ICD-10-CM

## 2018-05-13 DIAGNOSIS — Z1211 Encounter for screening for malignant neoplasm of colon: Secondary | ICD-10-CM

## 2018-05-13 MED ORDER — VARENICLINE TARTRATE 1 MG PO TABS: ORAL_TABLET | ORAL | 4 refills | Status: DC

## 2018-05-13 MED ORDER — VALSARTAN-HYDROCHLOROTHIAZIDE 160-25 MG PO TABS: ORAL_TABLET | ORAL | 1 refills | Status: DC

## 2018-05-13 NOTE — Patient Instructions (Addendum)
Start   Monitor your blood pressure at home, please keep a record and bring that in with you to your next office visit.   Go to the ER if any CP, SOB, nausea, dizziness, severe HA, changes vision/speech  Goal is <130/80  Your most recent BP: BP: (!) 180/90   Take your medications faithfully as instructed. Maintain a healthy weight. Get at least 150 minutes of aerobic exercise per week. Minimize salt intake. Minimize alcohol intake  DASH Eating Plan DASH stands for "Dietary Approaches to Stop Hypertension." The DASH eating plan is a healthy eating plan that has been shown to reduce high blood pressure (hypertension). Additional health benefits may include reducing the risk of type 2 diabetes mellitus, heart disease, and stroke. The DASH eating plan may also help with weight loss. WHAT DO I NEED TO KNOW ABOUT THE DASH EATING PLAN? For the DASH eating plan, you will follow these general guidelines:  Choose foods with a percent daily value for sodium of less than 5% (as listed on the food label).  Use salt-free seasonings or herbs instead of table salt or sea salt.  Check with your health care provider or pharmacist before using salt substitutes.  Eat lower-sodium products, often labeled as "lower sodium" or "no salt added."  Eat fresh foods.  Eat more vegetables, fruits, and low-fat dairy products.  Choose whole grains. Look for the word "whole" as the first word in the ingredient list.  Choose fish and skinless chicken or [place] more often than red meat. Limit fish, poultry, and meat to 6 oz (170 g) each day.  Limit sweets, desserts, sugars, and sugary drinks.  Choose heart-healthy fats.  Limit cheese to 1 oz (28 g) per day.  Eat more home-cooked food and less restaurant, buffet, and fast food.  Limit fried foods.  Cook foods using methods other than frying.  Limit canned vegetables. If you do use them, rinse them well to decrease the sodium.  When eating at a  restaurant, ask that your food be prepared with less salt, or no salt if possible. WHAT FOODS CAN I EAT? Seek help from a dietitian for individual calorie needs. Grains Whole grain or whole wheat bread. Brown rice. Whole grain or whole wheat pasta. Quinoa, bulgur, and whole grain cereals. Low-sodium cereals. Corn or whole wheat flour tortillas. Whole grain cornbread. Whole grain crackers. Low-sodium crackers. Vegetables Fresh or frozen vegetables (raw, steamed, roasted, or grilled). Low-sodium or reduced-sodium tomato and vegetable juices. Low-sodium or reduced-sodium tomato sauce and paste. Low-sodium or reduced-sodium canned vegetables.  Fruits All fresh, canned (in natural juice), or frozen fruits. Meat and Other Protein Products Ground beef (85% or leaner), grass-fed beef, or beef trimmed of fat. Skinless chicken or [place]. Ground chicken or [place]. Pork trimmed of fat. All fish and seafood. Eggs. Dried beans, peas, or lentils. Unsalted nuts and seeds. Unsalted canned beans. Dairy Low-fat dairy products, such as skim or 1% milk, 2% or reduced-fat cheeses, low-fat ricotta or cottage cheese, or plain low-fat yogurt. Low-sodium or reduced-sodium cheeses. Fats and Oils Tub margarines without trans fats. Light or reduced-fat mayonnaise and salad dressings (reduced sodium). Avocado. Safflower, olive, or canola oils. Natural peanut or almond butter. Other Unsalted popcorn and pretzels. The items listed above may not be a complete list of recommended foods or beverages. Contact your dietitian for more options. WHAT FOODS ARE NOT RECOMMENDED? Grains White bread. White pasta. White rice. Refined cornbread. Bagels and croissants. Crackers that contain trans fat. Vegetables Creamed or  fried vegetables. Vegetables in a cheese sauce. Regular canned vegetables. Regular canned tomato sauce and paste. Regular tomato and vegetable juices. Fruits Dried fruits. Canned fruit in light or heavy syrup. Fruit  juice. Meat and Other Protein Products Fatty cuts of meat. Ribs, chicken wings, bacon, sausage, bologna, salami, chitterlings, fatback, hot dogs, bratwurst, and packaged luncheon meats. Salted nuts and seeds. Canned beans with salt. Dairy Whole or 2% milk, cream, half-and-half, and cream cheese. Whole-fat or sweetened yogurt. Full-fat cheeses or blue cheese. Nondairy creamers and whipped toppings. Processed cheese, cheese spreads, or cheese curds. Condiments Onion and garlic salt, seasoned salt, table salt, and sea salt. Canned and packaged gravies. Worcestershire sauce. Tartar sauce. Barbecue sauce. Teriyaki sauce. Soy sauce, including reduced sodium. Steak sauce. Fish sauce. Oyster sauce. Cocktail sauce. Horseradish. Ketchup and mustard. Meat flavorings and tenderizers. Bouillon cubes. Hot sauce. Tabasco sauce. Marinades. Taco seasonings. Relishes. Fats and Oils Butter, stick margarine, lard, shortening, ghee, and bacon fat. Coconut, palm kernel, or palm oils. Regular salad dressings. Other Pickles and olives. Salted popcorn and pretzels. The items listed above may not be a complete list of foods and beverages to avoid. Contact your dietitian for more information. WHERE CAN I FIND MORE INFORMATION? National Heart, Lung, and Blood Institute: travelstabloid.com Document Released: 09/04/2011 Document Revised: 01/30/2014 Document Reviewed: 07/20/2013 [facility] Patient Information 2015 [place], [place]. This information is not intended to replace advice given to you by your health care provider. Make sure you discuss any questions you have with your health care provider.    American cancer society  [phone] for more information or for a free program for smoking cessation help.   You can call QUIT SMART 1-800-QUIT-NOW for free nicotine patches or replacement therapy- if they are out- keep calling  [facility] Can call for smoking cessation  classes, [phone]  If you have a smart phone, please look up Smoke Free app, this will help you stay on track and give you information about money you have saved, life that you have gained back and a ton of more information.   We are giving you chantix for smoking cessation. You can do it! And we are here to help! You may have heard some scary side effects about chantix, the three most common I hear about are nausea, crazy dreams and depression.  However, I like for my patients to try to stay on 1/2 a tablet twice a day rather than one tablet twice a day as normally prescribed. This helps decrease the chances of side effects and helps save money by making a one month prescription last two months  Please start the prescription this way:  Start 1/2 tablet by mouth once daily after food with a full glass of water for 3 days Then do 1/2 tablet by mouth twice daily for 4 days. During this first week you can smoke, but try to stop after this week.  At this point we have several options: 1) continue on 1/2 tablet twice a day- which I encourage you to do. You can stay on this dose the rest of the time on the medication or if you still feel the need to smoke you can do one of the two options below. 2) do one tablet in the morning and 1/2 in the evening which helps decrease dreams. 3) do one tablet twice a day.   What if I miss a dose? If you miss a dose, take it as soon as you can. If it is almost  time for your next dose, take only that dose. Do not take double or extra doses.  What should I watch for while using this medicine? Visit your doctor or health care professional for regular check ups. Ask for ongoing advice and encouragement from your doctor or healthcare professional, friends, and family to help you quit. If you smoke while on this medication, quit again  Your mouth may get dry. Chewing sugarless gum or hard candy, and drinking plenty of water may help. Contact your doctor if the problem  does not go away or is severe.  You may get drowsy or dizzy. Do not drive, use machinery, or do anything that needs mental alertness until you know how this medicine affects you. Do not stand or sit up quickly, especially if you are an older patient.   The use of this medicine may increase the chance of suicidal thoughts or actions. Pay special attention to how you are responding while on this medicine. Any worsening of mood, or thoughts of suicide or dying should be reported to your health care professional right away.  ADVANTAGES OF QUITTING SMOKING  Within 20 minutes, blood pressure decreases. Your pulse is at normal level.  After 8 hours, carbon monoxide levels in the blood return to normal. Your oxygen level increases.  After 24 hours, the chance of having a heart attack starts to decrease. Your breath, hair, and body stop smelling like smoke.  After 48 hours, damaged nerve endings begin to recover. Your sense of taste and smell improve.  After 72 hours, the body is virtually free of nicotine. Your bronchial tubes relax and breathing becomes easier.  After 2 to 12 weeks, lungs can hold more air. Exercise becomes easier and circulation improves.  After 1 year, the risk of coronary heart disease is cut in half.  After 5 years, the risk of stroke falls to the same as a nonsmoker.  After 10 years, the risk of lung cancer is cut in half and the risk of other cancers decreases significantly.  After 15 years, the risk of coronary heart disease drops, usually to the level of a nonsmoker.  You will have extra money to spend on things other than cigarettes.     Steps to Quit Smoking Smoking tobacco can be harmful to your health and can affect almost every organ in your body. Smoking puts you, and those around you, at risk for developing many serious chronic diseases. Quitting smoking is difficult, but it is one of the best things that you can do for your health. It is never too late to  quit. What are the benefits of quitting smoking? When you quit smoking, you lower your risk of developing serious diseases and conditions, such as:  Lung cancer or lung disease, such as COPD.  Heart disease.  Stroke.  Heart attack.  Infertility.  Osteoporosis and bone fractures.  Additionally, symptoms such as coughing, wheezing, and shortness of breath may get better when you quit. You may also find that you get sick less often because your body is stronger at fighting off colds and infections. If you are pregnant, quitting smoking can help to reduce your chances of having a baby of low birth weight. How do I get ready to quit? When you decide to quit smoking, create a plan to make sure that you are successful. Before you quit:  Pick a date to quit. Set a date within the next two weeks to give you time to prepare.  Write down  the reasons why you are quitting. Keep this list in places where you will see it often, such as on your bathroom mirror or in your car or wallet.  Identify the people, places, things, and activities that make you want to smoke (triggers) and avoid them. Make sure to take these actions: ? Throw away all cigarettes at home, at work, and in your car. ? Throw away smoking accessories, such as Scientist, research (medical). ? Clean your car and make sure to empty the ashtray. ? Clean your home, including curtains and carpets.  Tell your family, friends, and coworkers that you are quitting. Support from your loved ones can make quitting easier.  Talk with your health care provider about your options for quitting smoking.  Find out what treatment options are covered by your health insurance.  What strategies can I use to quit smoking? Talk with your healthcare provider about different strategies to quit smoking. Some strategies include:  Quitting smoking altogether instead of gradually lessening how much you smoke over a period of time. Research shows that quitting  "cold [place]" is more successful than gradually quitting.  Attending in-person counseling to help you build problem-solving skills. You are more likely to have success in quitting if you attend several counseling sessions. Even short sessions of 10 minutes can be effective.  Finding resources and support systems that can help you to quit smoking and remain smoke-free after you quit. These resources are most helpful when you use them often. They can include: ? Online chats with a Social worker. ? Telephone quitlines. ? Careers information officer. ? Support groups or group counseling. ? Text messaging programs. ? Mobile phone applications.  Taking medicines to help you quit smoking. (If you are pregnant or breastfeeding, talk with your health care provider first.) Some medicines contain nicotine and some do not. Both types of medicines help with cravings, but the medicines that include nicotine help to relieve withdrawal symptoms. Your health care provider may recommend: ? Nicotine patches, gum, or lozenges. ? Nicotine inhalers or sprays. ? Non-nicotine medicine that is taken by mouth.  Talk with your health care provider about combining strategies, such as taking medicines while you are also receiving in-person counseling. Using these two strategies together makes you more likely to succeed in quitting than if you used either strategy on its own. If you are pregnant or breastfeeding, talk with your health care provider about finding counseling or other support strategies to quit smoking. Do not take medicine to help you quit smoking unless told to do so by your health care provider. What things can I do to make it easier to quit? Quitting smoking might feel overwhelming at first, but there is a lot that you can do to make it easier. Take these important actions:  Reach out to your family and friends and ask that they support and encourage you during this time. Call telephone quitlines, reach out to  support groups, or work with a counselor for support.  Ask people who smoke to avoid smoking around you.  Avoid places that trigger you to smoke, such as bars, parties, or smoke-break areas at work.  Spend time around people who do not smoke.  Lessen stress in your life, because stress can be a smoking trigger for some people. To lessen stress, try: ? Exercising regularly. ? Deep-breathing exercises. ? Yoga. ? Meditating. ? Performing a body scan. This involves closing your eyes, scanning your body from head to toe, and noticing which parts of  your body are particularly tense. Purposefully relax the muscles in those areas.  Download or purchase mobile phone or tablet apps (applications) that can help you stick to your quit plan by providing reminders, tips, and encouragement. There are many free apps, such as QuitGuide from the [employer] Office manager for Disease Control and Prevention). You can find other support for quitting smoking (smoking cessation) through smokefree.gov and other websites.  How will I feel when I quit smoking? Within the first 24 hours of quitting smoking, you may start to feel some withdrawal symptoms. These symptoms are usually most noticeable 2-3 days after quitting, but they usually do not last beyond 2-3 weeks. Changes or symptoms that you might experience include:  Mood swings.  Restlessness, anxiety, or irritation.  Difficulty concentrating.  Dizziness.  Strong cravings for sugary foods in addition to nicotine.  Mild weight gain.  Constipation.  Nausea.  Coughing or a sore throat.  Changes in how your medicines work in your body.  A depressed mood.  Difficulty sleeping (insomnia).  After the first 2-3 weeks of quitting, you may start to notice more positive results, such as:  Improved sense of smell and taste.  Decreased coughing and sore throat.  Slower heart rate.  Lower blood pressure.  Clearer skin.  The ability to breathe more  easily.  Fewer sick days.  Quitting smoking is very challenging for most people. Do not get discouraged if you are not successful the first time. Some people need to make many attempts to quit before they achieve long-term success. Do your best to stick to your quit plan, and talk with your health care provider if you have any questions or concerns. This information is not intended to replace advice given to you by your health care provider. Make sure you discuss any questions you have with your health care provider. Document Released: 09/09/2001 Document Revised: 05/13/2016 Document Reviewed: 01/30/2015 Elsevier Interactive Patient Education  [employer].

## 2018-05-14 LAB — CBC WITH DIFFERENTIAL/PLATELET
Basophils Absolute: 69 cells/uL (ref 0–200)
Basophils Relative: 1.4 %
Eosinophils Absolute: 240 cells/uL (ref 15–500)
Eosinophils Relative: 4.9 %
HCT: 42.6 % (ref 38.5–50.0)
Hemoglobin: 14.4 g/dL (ref 13.2–17.1)
Lymphs Abs: 1421 cells/uL (ref 850–3900)
MCH: 30.8 pg (ref 27.0–33.0)
MCHC: 33.8 g/dL (ref 32.0–36.0)
MCV: 91.2 fL (ref 80.0–100.0)
MPV: 10.7 fL (ref 7.5–12.5)
Monocytes Relative: 8.7 %
Neutro Abs: 2744 cells/uL (ref 1500–7800)
Neutrophils Relative %: 56 %
Platelets: 220 10*3/uL (ref 140–400)
RBC: 4.67 10*6/uL (ref 4.20–5.80)
RDW: 12.2 % (ref 11.0–15.0)
Total Lymphocyte: 29 %
WBC mixed population: 426 cells/uL (ref 200–950)
WBC: 4.9 10*3/uL (ref 3.8–10.8)

## 2018-05-14 LAB — COMPLETE METABOLIC PANEL WITH GFR
AG Ratio: 1.7 (calc) (ref 1.0–2.5)
ALT: 29 U/L (ref 9–46)
AST: 26 U/L (ref 10–35)
Albumin: 4.3 g/dL (ref 3.6–5.1)
Alkaline phosphatase (APISO): 62 U/L (ref 40–115)
BUN: 21 mg/dL (ref 7–25)
CO2: 26 mmol/L (ref 20–32)
Calcium: 9.3 mg/dL (ref 8.6–10.3)
Chloride: 104 mmol/L (ref 98–110)
Creat: 1.1 mg/dL (ref 0.70–1.25)
GFR, Est African American: 80 mL/min/{1.73_m2} (ref >=60)
GFR, Est Non African American: 69 mL/min/{1.73_m2} (ref >=60)
Globulin: 2.5 g/dL (calc) (ref 1.9–3.7)
Glucose, Bld: 107 mg/dL — ABNORMAL HIGH (ref 65–99)
Potassium: 4.4 mmol/L (ref 3.5–5.3)
Sodium: 138 mmol/L (ref 135–146)
Total Bilirubin: 0.6 mg/dL (ref 0.2–1.2)
Total Protein: 6.8 g/dL (ref 6.1–8.1)

## 2018-05-14 LAB — LIPID PANEL
Cholesterol: 134 mg/dL (ref <200)
HDL: 47 mg/dL (ref >40)
LDL Cholesterol (Calc): 72 mg/dL (calc)
Non-HDL Cholesterol (Calc): 87 mg/dL (calc) (ref <130)
Total CHOL/HDL Ratio: 2.9 (calc) (ref <5.0)
Triglycerides: 71 mg/dL (ref <150)

## 2018-05-14 LAB — TSH: TSH: 0.99 mIU/L (ref 0.40–4.50)

## 2018-05-27 ENCOUNTER — Ambulatory Visit (INDEPENDENT_AMBULATORY_CARE_PROVIDER_SITE_OTHER): Payer: Medicare Other

## 2018-05-27 DIAGNOSIS — I10 Essential (primary) hypertension: Secondary | ICD-10-CM

## 2018-05-27 NOTE — Progress Notes (Signed)
Patient presents to the office for a BP check. Today's reading was 140/84, taken in the left arm. Patient has been taking Valsartan/HCTZ, 160/25, 1 tablet daily. Advised to continue to monitor at home and call with any questions or concerns.

## 2018-06-02 ENCOUNTER — Other Ambulatory Visit: Payer: Self-pay | Admitting: Internal Medicine

## 2018-06-27 ENCOUNTER — Other Ambulatory Visit: Payer: Self-pay | Admitting: Internal Medicine

## 2018-06-27 DIAGNOSIS — I10 Essential (primary) hypertension: Secondary | ICD-10-CM

## 2018-07-13 ENCOUNTER — Other Ambulatory Visit: Payer: Self-pay | Admitting: Internal Medicine

## 2018-07-14 ENCOUNTER — Other Ambulatory Visit: Payer: Self-pay | Admitting: *Deleted

## 2018-07-14 MED ORDER — ATORVASTATIN CALCIUM 20 MG PO TABS: 20.0000 mg | ORAL_TABLET | Freq: Every day | ORAL | 1 refills | Status: DC

## 2018-07-15 ENCOUNTER — Other Ambulatory Visit: Payer: Self-pay | Admitting: Internal Medicine

## 2018-07-15 MED ORDER — BUPROPION HCL ER (XL) 300 MG PO TB24: ORAL_TABLET | ORAL | 3 refills | Status: DC

## 2018-07-21 DIAGNOSIS — H2513 Age-related nuclear cataract, bilateral: Secondary | ICD-10-CM | POA: Diagnosis not present

## 2018-07-23 ENCOUNTER — Other Ambulatory Visit: Payer: Self-pay | Admitting: Internal Medicine

## 2018-08-02 DIAGNOSIS — M545 Low back pain: Secondary | ICD-10-CM | POA: Diagnosis not present

## 2018-08-02 DIAGNOSIS — Z6826 Body mass index (BMI) 26.0-26.9, adult: Secondary | ICD-10-CM | POA: Diagnosis not present

## 2018-08-02 DIAGNOSIS — I10 Essential (primary) hypertension: Secondary | ICD-10-CM | POA: Diagnosis not present

## 2018-08-16 ENCOUNTER — Encounter: Payer: Self-pay | Admitting: Internal Medicine

## 2018-09-04 ENCOUNTER — Other Ambulatory Visit: Payer: Self-pay | Admitting: Adult Health

## 2018-09-16 ENCOUNTER — Other Ambulatory Visit: Payer: Self-pay | Admitting: Internal Medicine

## 2018-09-16 DIAGNOSIS — I10 Essential (primary) hypertension: Secondary | ICD-10-CM

## 2018-09-16 MED ORDER — VALSARTAN-HYDROCHLOROTHIAZIDE 160-25 MG PO TABS: ORAL_TABLET | ORAL | 1 refills | Status: DC

## 2018-09-30 ENCOUNTER — Encounter: Payer: Self-pay | Admitting: Internal Medicine

## 2018-09-30 NOTE — Progress Notes (Signed)
[facility]   Unk Pinto, M.D.     Uvaldo Bristle. Silverio Lay, P.A.-C Liane Comber, [place]                [address] [place], [place] [address] Telephone [phone] Telefax [phone]  Comprehensive Evaluation & Examination     This very nice 68 y.o. MWM  presents for a  comprehensive evaluation and management of multiple medical co-morbidities.  Patient has been followed for HTN, HLD, Prediabetes and Vitamin D Deficiency.     Patient has 40+ pack year smoking hx  and he patient was counseled on the dangers of tobacco use and was advised to quit.  Reviewed strategies to maximize success, including removing cigarettes and smoking materials from environment, stress management, substitution of other forms of reinforcement, local smoking cessation programs  and pharmacotherapy. Due to his her long history of smoking over 35 pk years,  I discussed lung cancer screening with him. He was agreeable to undergo a screening low dose CT scan of the chest.  We discussed smoking cessation techniques/options. I will refer him her for a LDCT lung scan & lung cancer screening program.     HTN predates circa 2001. Patient's BP has been controlled at home.  Today's BP is at goal - 136/76. Patient denies any cardiac symptoms as chest pain, palpitations, shortness of breath, dizziness or ankle swelling.     Patient's hyperlipidemia is controlled with diet and medications. Patient denies myalgias or other medication SE's. Last lipids were at goal: Lab Results  Component Value Date   CHOL 122 10/01/2018   HDL 47 10/01/2018   LDLCALC 60 10/01/2018   TRIG 73 10/01/2018   CHOLHDL 2.6 10/01/2018      Patient has hx/o prediabetes (A1c 5.9% / 2015 then A1c 5.6% / 2017)  and patient denies reactive hypoglycemic symptoms, visual blurring, diabetic polys or paresthesias. Last A1c was Normal & at goal: Lab Results  Component  Value Date   HGBA1C 5.6 10/01/2018       Finally, patient has history of Vitamin D Deficiency and last vitamin D was at goal: Lab Results  Component Value Date   VD25OH 88 10/01/2018   Current Outpatient Medications on File Prior to Visit  Medication Sig  . atorvastatin (LIPITOR) 20 MG tablet Take 1 tablet (20 mg total) by mouth daily. for cholesterol  . Cholecalciferol (VITAMIN D-3) 5000 UNITS TABS Take 10,000 Units by mouth.   . gabapentin (NEURONTIN) 300 MG capsule Take 300 mg by mouth 3 (three) times daily.   . traZODone (DESYREL) 50 MG tablet TAKE 1-2 TABLETS BY MOUTH 30 MINUTES PRIOR TO BEDTIME.  . varenicline (CHANTIX) 1 MG tablet Take 1 mg by mouth 2 (two) times daily.  Marland Kitchen losartan (COZAAR) 25 MG tablet TAKE 1 TABLET BY MOUTH FOR BLOOD PRESSURE  . valsartan-hydrochlorothiazide (DIOVAN-HCT) 160-25 MG tablet Take 1 tablet Daily for BP   No current facility-administered medications on file prior to visit.    No Known Allergies   Past Medical History:  Diagnosis Date  . Abnormal liver function test   . Anxiety   . Colon polyp   . DDD (degenerative disc disease)   . ED (erectile dysfunction) 04/05/2014  . GERD (gastroesophageal reflux disease)   . Hyperlipidemia   . Hypertension   . Insomnia   . Tobacco dependence  Health Maintenance  Topic Date Due  . PNA vac Low Risk Adult (2 of 2 - PPSV23) 10/06/2017  . INFLUENZA VACCINE  04/29/2018  . Fecal DNA (Cologuard)  08/27/2020  . TETANUS/TDAP  07/11/2027  . Hepatitis C Screening  Completed   Immunization History  Administered Date(s) Administered  . Influenza Split 07/05/2014  . Influenza, High Dose Seasonal PF 08/21/2015  . Pneumococcal Conjugate-13 05/20/2016  . Pneumococcal Polysaccharide-23 10/06/2012  . Td 09/29/2006, 07/10/2017  . Zoster 08/21/2015   Last Colon - 07/23/2007  Negative Cologard 08/27/2017 and f/u due Nov 2021  Past Surgical History:  Procedure Laterality Date  . ADENOIDECTOMY     age 44  .  APPENDECTOMY    . DENTAL SURGERY     Dental implants  . SPINE SURGERY  Aug 2016   Dr. Vertell Limber  . Uvuectomy    . VASECTOMY     Family History  Problem Relation Age of Onset  . Thyroid disease Mother   . Hypertension Mother   . Osteoporosis Mother   . Glaucoma Mother   . Thyroid disease Father   . Hypertension Father   . Hyperlipidemia Father   . Heart disease Father        CHF  . Heart disease Paternal Grandfather 32       Long standing "heart trouble"   Social History   Socioeconomic History  . Marital status: Married    Spouse name: Debbie Investment banker, corporate)  . Number of children: 2  Occupational History  . Retired Pharmacologist .   Tobacco Use  . Smoking status: Current Every Day Smoker    Packs/day: 1.00    Years: 44.00    Pack years: 44.00    Types: Cigarettes  . Smokeless tobacco: Never Used  . Tobacco comment: On and off  Substance and Sexual Activity  . Alcohol use: Yes    Alcohol/week: 20.0 standard drinks    Types: 20 Cans of beer per week  . Drug use: No  . Sexual activity: Not on file  Social History Narrative   One grandchild.  Works on farm Engineer, structural.      ROS Constitutional: Denies fever, chills, weight loss/gain, headaches, insomnia,  night sweats or change in appetite. Does c/o fatigue. Eyes: Denies redness, blurred vision, diplopia, discharge, itchy or watery eyes.  ENT: Denies discharge, congestion, post nasal drip, epistaxis, sore throat, earache, hearing loss, dental pain, Tinnitus, Vertigo, Sinus pain or snoring.  Cardio: Denies chest pain, palpitations, irregular heartbeat, syncope, dyspnea, diaphoresis, orthopnea, PND, claudication or edema Respiratory: denies cough, dyspnea, DOE, pleurisy, hoarseness, laryngitis or wheezing.  Gastrointestinal: Denies dysphagia, heartburn, reflux, water brash, pain, cramps, nausea, vomiting, bloating, diarrhea, constipation, hematemesis, melena, hematochezia, jaundice or hemorrhoids Genitourinary: Denies dysuria,  frequency, discharge, hematuria or flank pain. Has urgency, nocturia x 2-3 & occasional hesitancy. Musculoskeletal: Denies arthralgia, myalgia, stiffness, Jt. Swelling, pain, limp or strain/sprain. Denies Falls. Skin: Denies puritis, rash, hives, warts, acne, eczema or change in skin lesion Neuro: No weakness, tremor, incoordination, spasms, paresthesia or pain Psychiatric: Denies confusion, memory loss or sensory loss. Denies Depression. Endocrine: Denies change in weight, skin, hair change, nocturia, and paresthesia, diabetic polys, visual blurring or hyper / hypo glycemic episodes.  Heme/Lymph: No excessive bleeding, bruising or enlarged lymph nodes.  Physical Exam  BP 136/76   Pulse 72   Temp 97.7 F (36.5 C)   Resp 16   Ht 6\' 2"  (1.88 m)   Wt 207 lb 12.8 oz (94.3 kg)  BMI 26.68 kg/m   General Appearance: Well nourished and well groomed and in no apparent distress.  Eyes: PERRLA, EOMs, conjunctiva no swelling or erythema, normal fundi and vessels. Sinuses: No frontal/maxillary tenderness ENT/Mouth: EACs patent / TMs  nl. Nares clear without erythema, swelling, mucoid exudates. Oral hygiene is good. No erythema, swelling, or exudate. Tongue normal, non-obstructing. Tonsils not swollen or erythematous. Hearing normal.  Neck: Supple, thyroid not palpable. No bruits, nodes or JVD. Respiratory: Respiratory effort normal.  BS equal and clear bilateral without rales, rhonci, wheezing or stridor. Cardio: Heart sounds are normal with regular rate and rhythm and no murmurs, rubs or gallops. Peripheral pulses are normal and equal bilaterally without edema. No aortic or femoral bruits. Chest: symmetric with normal excursions and percussion.  Abdomen: Soft, with Nl bowel sounds. Nontender, no guarding, rebound, hernias, masses, or organomegaly.  Lymphatics: Non tender without lymphadenopathy.  Genitourinary: No hernias.Testes nl. DRE - prostate nl for age - smooth & firm w/o  nodules. Musculoskeletal: Full ROM all peripheral extremities, joint stability, 5/5 strength, and normal gait. Skin: Warm and dry without rashes, lesions, cyanosis, clubbing or  ecchymosis.  Neuro: Cranial nerves intact, reflexes equal bilaterally. Normal muscle tone, no cerebellar symptoms. Sensation intact.  Pysch: Alert and oriented X 3 with normal affect, insight and judgment appropriate.   Assessment and Plan  1. Essential hypertension  - EKG 12-Lead - [place], RETROPERITNL ABD,  LTD - Urinalysis, Routine w reflex microscopic - Microalbumin / creatinine urine ratio - CBC with Differential/Platelet - COMPLETE METABOLIC PANEL WITH GFR - Magnesium - TSH  2. Hyperlipidemia, mixed  - EKG 12-Lead - [place], RETROPERITNL ABD,  LTD - Lipid panel - TSH  3. Abnormal glucose  - EKG 12-Lead - [place], RETROPERITNL ABD,  LTD - Hemoglobin A1c - Insulin, random  4. Vitamin D deficiency  - VITAMIN D 25 Hydroxyl 5. Prediabetes  - EKG 12-Lead - [place], RETROPERITNL ABD,  LTD - Hemoglobin A1c - Insulin, random  6. Screening for colorectal cancer  - POC Hemoccult Bld/Stl  7. Benign localized prostatic hyperplasia with lower urinary tract symptoms (LUTS)  - PSA  8. Screening for ischemic heart disease  - EKG 12-Lead  9. FHx: heart disease  - EKG 12-Lead - [place], RETROPERITNL ABD,  LTD  10. Smoker  - EKG 12-Lead - [place], RETROPERITNL ABD,  LTD  11. Screening for AAA (abdominal aortic aneurysm)  - [place], RETROPERITNL ABD,  LTD  12. Medication management  - Urinalysis, Routine w reflex microscopic - Microalbumin / creatinine urine ratio - CBC with Differential/Platelet - COMPLETE METABOLIC PANEL WITH GFR - Magnesium - Lipid panel - TSH - Hemoglobin A1c - Insulin, random - VITAMIN D 25 Hydroxyl  13. Prostate cancer screening  - PSA       Patient was counseled in prudent diet, weight control to achieve/maintain BMI less than 25, BP monitoring, regular exercise and medications as  discussed.  Discussed med effects and SE's. Routine screening labs and tests as requested with regular follow-up as recommended. [name], counseling, chart review and high complex critical decision making was performed

## 2018-09-30 NOTE — Patient Instructions (Signed)

## 2018-10-01 ENCOUNTER — Ambulatory Visit (INDEPENDENT_AMBULATORY_CARE_PROVIDER_SITE_OTHER): Payer: Medicare Other | Admitting: Internal Medicine

## 2018-10-01 VITALS — BP 136/76 | HR 72 | Temp 97.7°F | Resp 16 | Ht 74.0 in | Wt 207.8 lb

## 2018-10-01 DIAGNOSIS — R7303 Prediabetes: Secondary | ICD-10-CM

## 2018-10-01 DIAGNOSIS — Z125 Encounter for screening for malignant neoplasm of prostate: Secondary | ICD-10-CM | POA: Diagnosis not present

## 2018-10-01 DIAGNOSIS — E559 Vitamin D deficiency, unspecified: Secondary | ICD-10-CM

## 2018-10-01 DIAGNOSIS — N401 Enlarged prostate with lower urinary tract symptoms: Secondary | ICD-10-CM

## 2018-10-01 DIAGNOSIS — Z136 Encounter for screening for cardiovascular disorders: Secondary | ICD-10-CM

## 2018-10-01 DIAGNOSIS — Z8249 Family history of ischemic heart disease and other diseases of the circulatory system: Secondary | ICD-10-CM

## 2018-10-01 DIAGNOSIS — F17200 Nicotine dependence, unspecified, uncomplicated: Secondary | ICD-10-CM

## 2018-10-01 DIAGNOSIS — Z1211 Encounter for screening for malignant neoplasm of colon: Secondary | ICD-10-CM

## 2018-10-01 DIAGNOSIS — Z79899 Other long term (current) drug therapy: Secondary | ICD-10-CM | POA: Diagnosis not present

## 2018-10-01 DIAGNOSIS — Z1212 Encounter for screening for malignant neoplasm of rectum: Secondary | ICD-10-CM

## 2018-10-01 DIAGNOSIS — C760 Malignant neoplasm of head, face and neck: Secondary | ICD-10-CM

## 2018-10-01 DIAGNOSIS — Z122 Encounter for screening for malignant neoplasm of respiratory organs: Secondary | ICD-10-CM

## 2018-10-01 DIAGNOSIS — R7309 Other abnormal glucose: Secondary | ICD-10-CM

## 2018-10-01 DIAGNOSIS — E782 Mixed hyperlipidemia: Secondary | ICD-10-CM

## 2018-10-01 DIAGNOSIS — I10 Essential (primary) hypertension: Secondary | ICD-10-CM | POA: Diagnosis not present

## 2018-10-02 ENCOUNTER — Other Ambulatory Visit: Payer: Self-pay | Admitting: Internal Medicine

## 2018-10-02 ENCOUNTER — Encounter: Payer: Self-pay | Admitting: Internal Medicine

## 2018-10-04 ENCOUNTER — Other Ambulatory Visit: Payer: Self-pay | Admitting: Internal Medicine

## 2018-10-04 DIAGNOSIS — F17200 Nicotine dependence, unspecified, uncomplicated: Secondary | ICD-10-CM

## 2018-10-04 DIAGNOSIS — Z122 Encounter for screening for malignant neoplasm of respiratory organs: Secondary | ICD-10-CM

## 2018-10-04 LAB — COMPLETE METABOLIC PANEL WITH GFR
AG Ratio: 2 (calc) (ref 1.0–2.5)
ALT: 21 U/L (ref 9–46)
AST: 20 U/L (ref 10–35)
Albumin: 4.5 g/dL (ref 3.6–5.1)
Alkaline phosphatase (APISO): 56 U/L (ref 40–115)
BUN: 16 mg/dL (ref 7–25)
CO2: 27 mmol/L (ref 20–32)
Calcium: 9.5 mg/dL (ref 8.6–10.3)
Chloride: 97 mmol/L — ABNORMAL LOW (ref 98–110)
Creat: 1.19 mg/dL (ref 0.70–1.25)
GFR, Est African American: 72 mL/min/{1.73_m2} (ref >=60)
GFR, Est Non African American: 62 mL/min/{1.73_m2} (ref >=60)
Globulin: 2.3 g/dL (calc) (ref 1.9–3.7)
Glucose, Bld: 100 mg/dL — ABNORMAL HIGH (ref 65–99)
Potassium: 4.5 mmol/L (ref 3.5–5.3)
Sodium: 132 mmol/L — ABNORMAL LOW (ref 135–146)
Total Bilirubin: 0.7 mg/dL (ref 0.2–1.2)
Total Protein: 6.8 g/dL (ref 6.1–8.1)

## 2018-10-04 LAB — PSA: PSA: 2.5 ng/mL (ref <=4.0)

## 2018-10-04 LAB — CBC WITH DIFFERENTIAL/PLATELET
Absolute Monocytes: 451 cells/uL (ref 200–950)
Basophils Absolute: 69 cells/uL (ref 0–200)
Basophils Relative: 1.3 %
Eosinophils Absolute: 228 cells/uL (ref 15–500)
Eosinophils Relative: 4.3 %
HCT: 39.2 % (ref 38.5–50.0)
Hemoglobin: 13.5 g/dL (ref 13.2–17.1)
Lymphs Abs: 1187 cells/uL (ref 850–3900)
MCH: 31.2 pg (ref 27.0–33.0)
MCHC: 34.4 g/dL (ref 32.0–36.0)
MCV: 90.5 fL (ref 80.0–100.0)
MPV: 10.5 fL (ref 7.5–12.5)
Monocytes Relative: 8.5 %
Neutro Abs: 3366 cells/uL (ref 1500–7800)
Neutrophils Relative %: 63.5 %
Platelets: 236 10*3/uL (ref 140–400)
RBC: 4.33 10*6/uL (ref 4.20–5.80)
RDW: 11.8 % (ref 11.0–15.0)
Total Lymphocyte: 22.4 %
WBC: 5.3 10*3/uL (ref 3.8–10.8)

## 2018-10-04 LAB — MICROALBUMIN / CREATININE URINE RATIO
Creatinine, Urine: 63 mg/dL (ref 20–320)
Microalb Creat Ratio: 10 mcg/mg creat (ref <30)
Microalb, Ur: 0.6 mg/dL

## 2018-10-04 LAB — INSULIN, RANDOM: Insulin: 5.2 u[IU]/mL (ref 2.0–19.6)

## 2018-10-04 LAB — URINALYSIS, ROUTINE W REFLEX MICROSCOPIC
Bacteria, UA: NONE SEEN /HPF
Bilirubin Urine: NEGATIVE
Glucose, UA: NEGATIVE
Hgb urine dipstick: NEGATIVE
Hyaline Cast: NONE SEEN /LPF
Ketones, ur: NEGATIVE
Nitrite: NEGATIVE
Protein, ur: NEGATIVE
RBC / HPF: NONE SEEN /HPF (ref 0–2)
Specific Gravity, Urine: 1.009 (ref 1.001–1.03)
Squamous Epithelial / HPF: NONE SEEN /HPF (ref <=5)
pH: 6 (ref 5.0–8.0)

## 2018-10-04 LAB — TSH: TSH: 0.97 mIU/L (ref 0.40–4.50)

## 2018-10-04 LAB — LIPID PANEL
Cholesterol: 122 mg/dL (ref <200)
HDL: 47 mg/dL (ref >40)
LDL Cholesterol (Calc): 60 mg/dL (calc)
Non-HDL Cholesterol (Calc): 75 mg/dL (calc) (ref <130)
Total CHOL/HDL Ratio: 2.6 (calc) (ref <5.0)
Triglycerides: 73 mg/dL (ref <150)

## 2018-10-04 LAB — HEMOGLOBIN A1C
Hgb A1c MFr Bld: 5.6 % of total Hgb (ref <5.7)
Mean Plasma Glucose: 114 (calc)
eAG (mmol/L): 6.3 (calc)

## 2018-10-04 LAB — VITAMIN D 25 HYDROXY (VIT D DEFICIENCY, FRACTURES): Vit D, 25-Hydroxy: 88 ng/mL (ref 30–100)

## 2018-10-04 LAB — MAGNESIUM: Magnesium: 1.6 mg/dL (ref 1.5–2.5)

## 2018-10-12 ENCOUNTER — Ambulatory Visit
Admission: RE | Admit: 2018-10-12 | Discharge: 2018-10-12 | Disposition: A | Discharge status: Home / Self Care | Payer: Medicare Other | Source: Ambulatory Visit | Attending: Internal Medicine | Admitting: Internal Medicine

## 2018-10-12 DIAGNOSIS — F17210 Nicotine dependence, cigarettes, uncomplicated: Secondary | ICD-10-CM | POA: Diagnosis not present

## 2018-10-12 DIAGNOSIS — Z122 Encounter for screening for malignant neoplasm of respiratory organs: Secondary | ICD-10-CM

## 2018-10-12 DIAGNOSIS — F17200 Nicotine dependence, unspecified, uncomplicated: Secondary | ICD-10-CM

## 2018-10-15 ENCOUNTER — Other Ambulatory Visit: Payer: Self-pay | Admitting: Adult Health

## 2018-10-25 DIAGNOSIS — I10 Essential (primary) hypertension: Secondary | ICD-10-CM | POA: Diagnosis not present

## 2018-10-25 DIAGNOSIS — M545 Low back pain: Secondary | ICD-10-CM | POA: Diagnosis not present

## 2018-10-25 DIAGNOSIS — Z6826 Body mass index (BMI) 26.0-26.9, adult: Secondary | ICD-10-CM | POA: Diagnosis not present

## 2018-10-25 DIAGNOSIS — M5417 Radiculopathy, lumbosacral region: Secondary | ICD-10-CM | POA: Diagnosis not present

## 2018-10-26 ENCOUNTER — Other Ambulatory Visit: Payer: Self-pay | Admitting: Internal Medicine

## 2018-10-26 DIAGNOSIS — I2510 Atherosclerotic heart disease of native coronary artery without angina pectoris: Secondary | ICD-10-CM

## 2018-12-29 ENCOUNTER — Other Ambulatory Visit: Payer: Self-pay | Admitting: Internal Medicine

## 2019-01-13 DIAGNOSIS — M545 Low back pain: Secondary | ICD-10-CM | POA: Diagnosis not present

## 2019-01-13 DIAGNOSIS — M5126 Other intervertebral disc displacement, lumbar region: Secondary | ICD-10-CM | POA: Diagnosis not present

## 2019-01-27 ENCOUNTER — Ambulatory Visit: Payer: Self-pay | Admitting: Adult Health

## 2019-02-07 ENCOUNTER — Encounter: Payer: Self-pay | Admitting: Adult Health

## 2019-02-07 NOTE — Progress Notes (Signed)
Virtual Visit via Telephone Note  I connected with Arthur Garcia on 02/08/19 at  2:00 PM EDT by telephone and verified that I am speaking with the correct person using two identifiers.  Location: Patient: home Provider: [place] office    I discussed the limitations, risks, security and privacy concerns of performing an evaluation and management service by telephone and the availability of in person appointments. I also discussed with the patient that there may be a patient responsible charge related to this service. The patient expressed understanding and agreed to proceed.   I discussed the assessment and treatment plan with the patient. The patient was provided an opportunity to ask questions and all were answered. The patient agreed with the plan and demonstrated an understanding of the instructions.   The patient was advised to call back or seek an in-person evaluation if the symptoms worsen or if the condition fails to improve as anticipated.  I provided 28 minutes of non-face-to-face time during this encounter.   Izora Ribas, NP     MEDICARE ANNUAL WELLNESS VISIT AND FOLLOW UP Assessment:   Diagnoses and all orders for this visit:  Medicare annual wellness visit, subsequent  Essential hypertension Continue valsartan/hctz Monitor blood pressure at home regularly; call if consistently over 130/80 Continue DASH diet.   Reminder to go to the ER if any CP, SOB, nausea, dizziness, severe HA, changes vision/speech, left arm numbness and tingling and jaw pain.  Other male erectile dysfunction Treated by Cialis PRN; no concerns or complications Established with urology; follow up PRN  Hyperlipidemia, unspecified hyperlipidemia type Well controlled; continue statin medication Continue low cholesterol diet and exercise.  -     Lipid panel -     TSH  TOBACCO ABUSE Discussed risks associated with tobacco use and advised to reduce or quit Patient is not ready to do so, but  advised to consider strongly - he is holding chantix prescription and consider as he is in contemplation - information provided Will follow up at the next visit CT screening neg 09/2018; repeat annually   Right bundle branch block (RBBB) with left anterior fascicular block (LAFB) Evaluated by cardiology; no further interventions recommended unless progresses and symptomatic Continue annual EKG and as indicated  Other abnormal glucose A1Cs recently well controlled; previously prediabetic Discussed disease and risks Discussed diet/exercise, weight management  -     CMP WITH GFR  Vitamin D deficiency At goal; continue supplementation Defer vitamin D level to next visit  Medication management -     CBC with Differential/Platelet -     CMP/GFR  CKD (chronic kidney disease) stage 2, GFR 60-89 ml/min Increase fluids, avoid NSAIDS, monitor sugars, will monitor  GERD Well managed off of medications at this time Discussed diet, avoiding triggers and other lifestyle changes   Televisit; last labs reviewed; no urgent follow up needed, no changes; defer to next OV  Over 30 minutes of exam, counseling, chart review, and critical decision making was performed  Future Appointments  Date Time Provider [place]  04/29/2019 11:15 AM Unk Pinto, MD [facility] None  10/21/2019 10:00 AM Unk Pinto, MD [facility] None  02/16/2020  2:00 PM Liane Comber, NP [facility] None     Plan:   During the course of the visit the patient was educated and counseled about appropriate screening and preventive services including:    Pneumococcal vaccine   Influenza vaccine  Prevnar 13  Td vaccine  Screening electrocardiogram  Colorectal cancer screening  Diabetes screening  Glaucoma screening  Nutrition counseling    Subjective:  Arthur Garcia is a 68 y.o. male who presents for Medicare Annual Wellness Visit and 3 month follow up for HTN, hyperlipidemia, glucose  management (hx of prediabetes), and vitamin D Def.   He has hx of DDD s/p lumbar discectomy x 2 by Dr. Vertell Limber in 2016 - he continues to experience pain and discomfort related to this, currently prescribed percocet and neurontin and managed by pain management via Dr. Melven Sartorius office.   he currently continues to smoke 1 pack a day; discussed risks associated with smoking, patient is not ready to quit - he is considering quitting or cutting back but is not ready to do so. He has been prescribed chantix and holding to start this year sometime.  He has 30+ pack/yr smoking history and recently underwent low dose CT screening in 09/2018 which was negative.   BMI is Body mass index is 26.32 kg/m., he has been working on diet and exercise, walks ~30 min daily with dog, working on [employer] upper home most days.  Wt Readings from Last 3 Encounters:  02/08/19 205 lb (93 kg)  10/01/18 207 lb 12.8 oz (94.3 kg)  05/27/18 201 lb (91.2 kg)   He has not been checking BPs at home regularly, today their BP is BP: 120/80 He is taking valsartan/hctz combo.  He does workout. He denies chest pain, shortness of breath, dizziness.   He is on cholesterol medication (atorvastatin 20 mg daily) and denies myalgias. His cholesterol is at goal. The cholesterol last visit was:   Lab Results  Component Value Date   CHOL 122 10/01/2018   HDL 47 10/01/2018   LDLCALC 60 10/01/2018   TRIG 73 10/01/2018   CHOLHDL 2.6 10/01/2018   He has been working on diet and exercise for glucose management, and denies increased appetite, nausea, paresthesia of the feet, polydipsia, polyuria, visual disturbances and vomiting. Last A1C in the office was:  Lab Results  Component Value Date   HGBA1C 5.6 10/01/2018   Last GFR Lab Results  Component Value Date   GFRNONAA 62 10/01/2018    Patient is on Vitamin D supplement and at goal at most recent check:    Lab Results  Component Value Date   VD25OH 88 10/01/2018      Medication  Review: Current Outpatient Medications on File Prior to Visit  Medication Sig Dispense Refill  . atorvastatin (LIPITOR) 20 MG tablet TAKE 1 TABLET BY MOUTH EVERY DAY FOR CHOLESTEROL 90 tablet 1  . Cholecalciferol (VITAMIN D-3) 5000 UNITS TABS Take 10,000 Units by mouth.     . diclofenac (VOLTAREN) 75 MG EC tablet TAKE 1 TABLET BY MOUTH THREE TIMES DAILY AS NEEDED FOR PAIN AND INFLAMMINATION 270 tablet 1  . gabapentin (NEURONTIN) 300 MG capsule Take 300 mg by mouth 3 (three) times daily.     . traZODone (DESYREL) 50 MG tablet TAKE 1-2 TABLETS BY MOUTH 30 MINUTES PRIOR TO BEDTIME. 180 tablet 0  . valsartan-hydrochlorothiazide (DIOVAN-HCT) 160-25 MG tablet Take 1 tablet Daily for BP 90 tablet 1  . varenicline (CHANTIX) 1 MG tablet Take 1 mg by mouth 2 (two) times daily.     No current facility-administered medications on file prior to visit.     Allergies: No Known Allergies  Current Problems (verified) has Hyperlipidemia, mixed; Smoker; ED (erectile dysfunction); Right bundle branch block (RBBB) with left anterior fascicular block (LAFB); Essential hypertension; Vitamin D deficiency; Medication management; CKD (chronic kidney disease) stage  2, GFR 60-89 ml/min; Gastroesophageal reflux disease; Other abnormal glucose; and Overweight (BMI 25.0-29.9) on their problem list.  Screening Tests Immunization History  Administered Date(s) Administered  . Influenza Split 07/05/2014  . Influenza, High Dose Seasonal PF 08/21/2015  . Pneumococcal Conjugate-13 05/20/2016  . Pneumococcal Polysaccharide-23 10/06/2012  . Td 09/29/2006, 07/10/2017  . Zoster 08/21/2015   Preventative care: Last colonoscopy: 2008  Cologuard: 07/2017 neg due 2021  Prior vaccinations: TD or Tdap: 2018  Influenza: 2016 declines Pneumococcal: 2014, get at next office visit Prevnar13: 2017 Shingles/Zostavax: 2016  Names of Other Physician/Practitioners you currently use: 1. [facility] here for primary care 2. Dr. Delman Cheadle, eye doctor, last visit 2019, goes q12 months, no issues 3. Dr. Lavone Neri, dentist, last visit 2019, goes q58m  Patient Care Team: Unk Pinto, MD as PCP - General (Internal Medicine) Erline Levine, MD as Consulting Physician (Neurosurgery) Ladene Artist, MD as Consulting Physician (Gastroenterology) Sharyne Peach, MD as Consulting Physician (Ophthalmology) Minus Breeding, MD as Consulting Physician (Cardiology) Druscilla Brownie, MD as Consulting Physician (Dermatology)  Surgical: He  has a past surgical history that includes Uvuectomy; Appendectomy; Vasectomy; Adenoidectomy; Spine surgery (Aug 2016); and Dental surgery. Family His family history includes Glaucoma in his mother; Heart disease in his father; Heart disease (age of onset: 66) in his paternal grandfather; Hyperlipidemia in his father; Hypertension in his father and mother; Osteoporosis in his mother; Thyroid disease in his father and mother. Social history  He reports that he has been smoking cigarettes. He has a 44.00 pack-year smoking history. He has never used smokeless tobacco. He reports current alcohol use of about 20.0 standard drinks of alcohol per week. He reports that he does not use drugs.  MEDICARE WELLNESS OBJECTIVES: Physical activity: Current Exercise Habits: Home exercise routine, Type of exercise: walking, Time (Minutes): 30, Frequency (Times/Week): 7, Weekly Exercise (Minutes/Week): 210, Intensity: Mild, Exercise limited by: None identified Cardiac risk factors: Cardiac Risk Factors include: advanced age (>81men, >32 women);hypertension;male gender;dyslipidemia;smoking/ tobacco exposure Depression/mood screen:   Depression screen [facility] 2/9 02/08/2019  Decreased Interest 0  Down, Depressed, Hopeless 0  PHQ - 2 Score 0    ADLs:  In your present state of health, do you have any difficulty performing the following activities: 02/08/2019 09/30/2018  Hearing? N N   Comment stable L ear hearing loss; unchanged for several years; doesn't interfere with life -  Vision? N N  Difficulty concentrating or making decisions? N N  Walking or climbing stairs? N N  Dressing or bathing? N N  Doing errands, shopping? N N  Some recent data might be hidden     Cognitive Testing  Alert? Yes  Normal Appearance?Yes  Oriented to person? Yes  Place? Yes   Time? Yes  Recall of three objects?  Yes  Can perform simple calculations? Yes  Displays appropriate judgment?Yes  Can read the correct time from a watch face?Yes  EOL planning: Does Patient Have a Medical Advance Directive?: Yes Type of Advance Directive: Healthcare Power of Attorney, Living will Does patient want to make changes to medical advance directive?: No - Patient declined Copy of [place] in Chart?: No - copy requested   Objective:   Today's Vitals   02/08/19 1434  BP: 120/80  Weight: 205 lb (93 kg)   Body mass index is 26.32 kg/m.  General : Well sounding patient in no apparent distress HEENT: no hoarseness, no cough for duration of visit Lungs: speaks in complete  sentences, no audible wheezing, no apparent distress Neurological: alert, oriented x 3 Psychiatric: pleasant, judgement appropriate    Medicare Attestation I have personally reviewed: The patient's medical and social history Their use of alcohol, tobacco or illicit drugs Their current medications and supplements The patient's functional ability including ADLs,fall risks, home safety risks, cognitive, and hearing and visual impairment Diet and physical activities Evidence for depression or mood disorders  The patient's weight, height, BMI, and visual acuity have been recorded in the chart.  I have made referrals, counseling, and provided education to the patient based on review of the above and I have provided the patient with a written personalized care plan for preventive services.     Izora Ribas, NP   02/08/2019

## 2019-02-08 ENCOUNTER — Other Ambulatory Visit: Payer: Self-pay

## 2019-02-08 ENCOUNTER — Encounter: Payer: Self-pay | Admitting: Adult Health

## 2019-02-08 ENCOUNTER — Ambulatory Visit: Payer: Medicare Other | Admitting: Adult Health

## 2019-02-08 VITALS — BP 120/80 | Wt 205.0 lb

## 2019-02-08 DIAGNOSIS — E663 Overweight: Secondary | ICD-10-CM

## 2019-02-08 DIAGNOSIS — R6889 Other general symptoms and signs: Secondary | ICD-10-CM | POA: Diagnosis not present

## 2019-02-08 DIAGNOSIS — E559 Vitamin D deficiency, unspecified: Secondary | ICD-10-CM

## 2019-02-08 DIAGNOSIS — K219 Gastro-esophageal reflux disease without esophagitis: Secondary | ICD-10-CM | POA: Diagnosis not present

## 2019-02-08 DIAGNOSIS — F17200 Nicotine dependence, unspecified, uncomplicated: Secondary | ICD-10-CM | POA: Diagnosis not present

## 2019-02-08 DIAGNOSIS — R7309 Other abnormal glucose: Secondary | ICD-10-CM

## 2019-02-08 DIAGNOSIS — N528 Other male erectile dysfunction: Secondary | ICD-10-CM | POA: Diagnosis not present

## 2019-02-08 DIAGNOSIS — I452 Bifascicular block: Secondary | ICD-10-CM

## 2019-02-08 DIAGNOSIS — Z79899 Other long term (current) drug therapy: Secondary | ICD-10-CM

## 2019-02-08 DIAGNOSIS — N182 Chronic kidney disease, stage 2 (mild): Secondary | ICD-10-CM | POA: Diagnosis not present

## 2019-02-08 DIAGNOSIS — I10 Essential (primary) hypertension: Secondary | ICD-10-CM | POA: Diagnosis not present

## 2019-02-08 DIAGNOSIS — Z0001 Encounter for general adult medical examination with abnormal findings: Secondary | ICD-10-CM

## 2019-02-08 DIAGNOSIS — E782 Mixed hyperlipidemia: Secondary | ICD-10-CM

## 2019-02-08 DIAGNOSIS — Z0000 Encounter for general adult medical examination without abnormal findings: Secondary | ICD-10-CM

## 2019-04-20 DIAGNOSIS — I10 Essential (primary) hypertension: Secondary | ICD-10-CM | POA: Diagnosis not present

## 2019-04-20 DIAGNOSIS — Z6826 Body mass index (BMI) 26.0-26.9, adult: Secondary | ICD-10-CM | POA: Diagnosis not present

## 2019-04-20 DIAGNOSIS — M5417 Radiculopathy, lumbosacral region: Secondary | ICD-10-CM | POA: Diagnosis not present

## 2019-04-20 DIAGNOSIS — M5126 Other intervertebral disc displacement, lumbar region: Secondary | ICD-10-CM | POA: Diagnosis not present

## 2019-04-29 ENCOUNTER — Ambulatory Visit: Payer: Self-pay | Admitting: Internal Medicine

## 2019-05-02 ENCOUNTER — Encounter: Payer: Self-pay | Admitting: Internal Medicine

## 2019-05-02 NOTE — Patient Instructions (Signed)

## 2019-05-02 NOTE — Progress Notes (Signed)
History of Present Illness:     This very nice 68 y.o. MWM presents for 6 month follow up with HTN, HLD, Pre-Diabetes and Vitamin D Deficiency.       Patient is treated for HTN  (2001) & BP has been controlled on Diovan-HCT. Today's BP is at goal - 130/74. Patient has had no complaints of any cardiac type chest pain, palpitations, dyspnea / orthopnea / PND, dizziness, claudication, or dependent edema.      Hyperlipidemia is controlled with diet & Atorvastatin. Patient denies myalgias or other med SE's. Last Lipids were at goal: Lab Results  Component Value Date   CHOL 122 10/01/2018   HDL 47 10/01/2018   LDLCALC 60 10/01/2018   TRIG 73 10/01/2018   CHOLHDL 2.6 10/01/2018       Also, the patient has history of PreDiabetes (A1c 5.9% / 2015 then A1c 5.6% / 2017)and he has had no symptoms of reactive hypoglycemia, diabetic polys, paresthesias or visual blurring.  Last A1c was Normal & at goal: Lab Results  Component Value Date   HGBA1C 5.6 10/01/2018       Further, the patient also has history of Vitamin D Deficiency and supplements vitamin D without any suspected side-effects. Last vitamin D was at goal: Lab Results  Component Value Date   VD25OH 88 10/01/2018   Current Outpatient Medications on File Prior to Visit  Medication Sig  . atorvastatin (LIPITOR) 20 MG tablet TAKE 1 TABLET BY MOUTH EVERY DAY FOR CHOLESTEROL  . Cholecalciferol (VITAMIN D-3) 5000 UNITS TABS Take 10,000 Units by mouth.   . diclofenac (VOLTAREN) 75 MG EC tablet TAKE 1 TABLET BY MOUTH THREE TIMES DAILY AS NEEDED FOR PAIN AND INFLAMMINATION  . gabapentin (NEURONTIN) 300 MG capsule Take 300 mg by mouth 3 (three) times daily.   . traZODone (DESYREL) 50 MG tablet TAKE 1-2 TABLETS BY MOUTH 30 MINUTES PRIOR TO BEDTIME.  . valsartan-hydrochlorothiazide (DIOVAN-HCT) 160-25 MG tablet Take 1 tablet Daily for BP  . varenicline (CHANTIX) 1 MG tablet Take 1 mg by mouth 2 (two) times daily.   No current  facility-administered medications on file prior to visit.    No Known Allergies  PMHx:   Past Medical History:  Diagnosis Date  . Abnormal liver function test   . Anxiety   . Colon polyp   . DDD (degenerative disc disease)   . ED (erectile dysfunction) 04/05/2014  . FHx: heart disease 01/28/2018  . GERD (gastroesophageal reflux disease)   . Hyperlipidemia   . Hypertension   . Insomnia   . Tobacco dependence    Immunization History  Administered Date(s) Administered  . Influenza Split 07/05/2014  . Influenza, High Dose Seasonal PF 08/21/2015  . Pneumococcal Conjugate-13 05/20/2016  . Pneumococcal Polysaccharide-23 10/06/2012  . Td 09/29/2006, 07/10/2017  . Zoster 08/21/2015   Past Surgical History:  Procedure Laterality Date  . ADENOIDECTOMY     age 49  . APPENDECTOMY    . DENTAL SURGERY     Dental implants  . SPINE SURGERY  Aug 2016   Dr. Vertell Limber  . Uvuectomy    . VASECTOMY     FHx:    Reviewed / unchanged  SHx:    Reviewed / unchanged   Systems Review:  Constitutional: Denies fever, chills, wt changes, headaches, insomnia, fatigue, night sweats, change in appetite. Eyes: Denies redness, blurred vision, diplopia, discharge, itchy, watery eyes.  ENT: Denies discharge, congestion, post nasal drip, epistaxis, sore throat, earache,  hearing loss, dental pain, tinnitus, vertigo, sinus pain, snoring.  CV: Denies chest pain, palpitations, irregular heartbeat, syncope, dyspnea, diaphoresis, orthopnea, PND, claudication or edema. Respiratory: denies cough, dyspnea, DOE, pleurisy, hoarseness, laryngitis, wheezing.  Gastrointestinal: Denies dysphagia, odynophagia, heartburn, reflux, water brash, abdominal pain or cramps, nausea, vomiting, bloating, diarrhea, constipation, hematemesis, melena, hematochezia  or hemorrhoids. Genitourinary: Denies dysuria, frequency, urgency, nocturia, hesitancy, discharge, hematuria or flank pain. Musculoskeletal: Denies arthralgias, myalgias,  stiffness, jt. swelling, pain, limping or strain/sprain.  Skin: Denies pruritus, rash, hives, warts, acne, eczema or change in skin lesion(s). Neuro: No weakness, tremor, incoordination, spasms, paresthesia or pain. Psychiatric: Denies confusion, memory loss or sensory loss. Endo: Denies change in weight, skin or hair change.  Heme/Lymph: No excessive bleeding, bruising or enlarged lymph nodes.  Physical Exam  BP 130/74   Pulse (!) 56   Temp (!) 97.5 F (36.4 C)   Resp 16   Ht 6\' 2"  (1.88 m)   Wt 209 lb 12.8 oz (95.2 kg)   BMI 26.94 kg/m   Appears  well nourished, well groomed  and in no distress.  Eyes: PERRLA, EOMs, conjunctiva no swelling or erythema. Sinuses: No frontal/maxillary tenderness ENT/Mouth: EAC's clear, TM's nl w/o erythema, bulging. Nares clear w/o erythema, swelling, exudates. Oropharynx clear without erythema or exudates. Oral hygiene is good. Tongue normal, non obstructing. Hearing intact.  Neck: Supple. Thyroid not palpable. Car 2+/2+ without bruits, nodes or JVD. Chest: Respirations nl with BS clear & equal w/o rales, rhonchi, wheezing or stridor.  Cor: Heart sounds normal w/ regular rate and rhythm without sig. murmurs, gallops, clicks or rubs. Peripheral pulses normal and equal  without edema.  Abdomen: Soft & bowel sounds normal. Non-tender w/o guarding, rebound, hernias, masses or organomegaly.  Lymphatics: Unremarkable.  Musculoskeletal: Full ROM all peripheral extremities, joint stability, 5/5 strength and normal gait.  Skin: Warm, dry without exposed rashes, lesions or ecchymosis apparent.  Neuro: Cranial nerves intact, reflexes equal bilaterally. Sensory-motor testing grossly intact. Tendon reflexes grossly intact.  Pysch: Alert & oriented x 3.  Insight and judgement nl & appropriate. No ideations.  Assessment and Plan:  1. Essential hypertension  - Continue medication, monitor blood pressure at home.  - Continue DASH diet.  Reminder to go to the ER  if any CP,  SOB, nausea, dizziness, severe HA, changes vision/speech.  - CBC with Differential/Platelet - COMPLETE METABOLIC PANEL WITH GFR - Magnesium - TSH  2. Hyperlipidemia, mixed  - Continue diet/meds, exercise,& lifestyle modifications.  - Continue monitor periodic cholesterol/liver & renal functions   - Lipid panel - TSH  3. Abnormal glucose  - Continue diet, exercise  - Lifestyle modifications.  - Monitor appropriate labs.  - Hemoglobin A1c - Insulin, random  4. Vitamin D deficiency  - Continue supplementation.  - VITAMIN D 25 Hydroxyl  5. Prediabetes  - Hemoglobin A1c - Insulin, random  6. Medication management  - CBC with Differential/Platelet - COMPLETE METABOLIC PANEL WITH GFR - Magnesium - Lipid panel - TSH - Hemoglobin A1c - Insulin, random - VITAMIN D 25 Hydroxyl       Discussed  regular exercise, BP monitoring, weight control to achieve/maintain BMI less than 25 and discussed med and SE's. Recommended labs to assess and monitor clinical status with further disposition pending results of labs.  I discussed the assessment and treatment plan with the patient. The patient was provided an opportunity to ask questions and all were answered. The patient agreed with the plan and demonstrated an understanding of  the instructions.  I provided over 30 minutes of exam, counseling, chart review and  complex critical decision making.   Kirtland Bouchard, MD

## 2019-05-03 ENCOUNTER — Other Ambulatory Visit: Payer: Self-pay

## 2019-05-03 ENCOUNTER — Ambulatory Visit (INDEPENDENT_AMBULATORY_CARE_PROVIDER_SITE_OTHER): Payer: Medicare Other | Admitting: Internal Medicine

## 2019-05-03 VITALS — BP 130/74 | HR 56 | Temp 97.5°F | Resp 16 | Ht 74.0 in | Wt 209.8 lb

## 2019-05-03 DIAGNOSIS — E559 Vitamin D deficiency, unspecified: Secondary | ICD-10-CM | POA: Diagnosis not present

## 2019-05-03 DIAGNOSIS — I10 Essential (primary) hypertension: Secondary | ICD-10-CM

## 2019-05-03 DIAGNOSIS — R7309 Other abnormal glucose: Secondary | ICD-10-CM | POA: Diagnosis not present

## 2019-05-03 DIAGNOSIS — I2510 Atherosclerotic heart disease of native coronary artery without angina pectoris: Secondary | ICD-10-CM | POA: Diagnosis not present

## 2019-05-03 DIAGNOSIS — Z79899 Other long term (current) drug therapy: Secondary | ICD-10-CM | POA: Diagnosis not present

## 2019-05-03 DIAGNOSIS — R7303 Prediabetes: Secondary | ICD-10-CM | POA: Diagnosis not present

## 2019-05-03 DIAGNOSIS — E782 Mixed hyperlipidemia: Secondary | ICD-10-CM | POA: Diagnosis not present

## 2019-05-04 LAB — CBC WITH DIFFERENTIAL/PLATELET
Absolute Monocytes: 370 cells/uL (ref 200–950)
Basophils Absolute: 60 cells/uL (ref 0–200)
Basophils Relative: 1.2 %
Eosinophils Absolute: 220 cells/uL (ref 15–500)
Eosinophils Relative: 4.4 %
HCT: 39.9 % (ref 38.5–50.0)
Hemoglobin: 13.5 g/dL (ref 13.2–17.1)
Lymphs Abs: 1215 cells/uL (ref 850–3900)
MCH: 31.1 pg (ref 27.0–33.0)
MCHC: 33.8 g/dL (ref 32.0–36.0)
MCV: 91.9 fL (ref 80.0–100.0)
MPV: 10.6 fL (ref 7.5–12.5)
Monocytes Relative: 7.4 %
Neutro Abs: 3135 cells/uL (ref 1500–7800)
Neutrophils Relative %: 62.7 %
Platelets: 221 10*3/uL (ref 140–400)
RBC: 4.34 10*6/uL (ref 4.20–5.80)
RDW: 12.1 % (ref 11.0–15.0)
Total Lymphocyte: 24.3 %
WBC: 5 10*3/uL (ref 3.8–10.8)

## 2019-05-04 LAB — COMPLETE METABOLIC PANEL WITH GFR
AG Ratio: 1.9 (calc) (ref 1.0–2.5)
ALT: 20 U/L (ref 9–46)
AST: 19 U/L (ref 10–35)
Albumin: 4.5 g/dL (ref 3.6–5.1)
Alkaline phosphatase (APISO): 53 U/L (ref 35–144)
BUN/Creatinine Ratio: 18 (calc) (ref 6–22)
BUN: 23 mg/dL (ref 7–25)
CO2: 30 mmol/L (ref 20–32)
Calcium: 9.6 mg/dL (ref 8.6–10.3)
Chloride: 99 mmol/L (ref 98–110)
Creat: 1.27 mg/dL — ABNORMAL HIGH (ref 0.70–1.25)
GFR, Est African American: 67 mL/min/{1.73_m2} (ref >=60)
GFR, Est Non African American: 58 mL/min/{1.73_m2} — ABNORMAL LOW (ref >=60)
Globulin: 2.4 g/dL (calc) (ref 1.9–3.7)
Glucose, Bld: 105 mg/dL — ABNORMAL HIGH (ref 65–99)
Potassium: 4.8 mmol/L (ref 3.5–5.3)
Sodium: 134 mmol/L — ABNORMAL LOW (ref 135–146)
Total Bilirubin: 0.5 mg/dL (ref 0.2–1.2)
Total Protein: 6.9 g/dL (ref 6.1–8.1)

## 2019-05-04 LAB — LIPID PANEL
Cholesterol: 120 mg/dL (ref <200)
HDL: 42 mg/dL (ref >=40)
LDL Cholesterol (Calc): 63 mg/dL (calc)
Non-HDL Cholesterol (Calc): 78 mg/dL (calc) (ref <130)
Total CHOL/HDL Ratio: 2.9 (calc) (ref <5.0)
Triglycerides: 72 mg/dL (ref <150)

## 2019-05-04 LAB — TSH: TSH: 0.89 mIU/L (ref 0.40–4.50)

## 2019-05-04 LAB — INSULIN, RANDOM: Insulin: 4.2 u[IU]/mL

## 2019-05-04 LAB — MAGNESIUM: Magnesium: 1.7 mg/dL (ref 1.5–2.5)

## 2019-05-04 LAB — HEMOGLOBIN A1C
Hgb A1c MFr Bld: 5.5 % of total Hgb (ref <5.7)
Mean Plasma Glucose: 111 (calc)
eAG (mmol/L): 6.2 (calc)

## 2019-05-04 LAB — VITAMIN D 25 HYDROXY (VIT D DEFICIENCY, FRACTURES): Vit D, 25-Hydroxy: 125 ng/mL — ABNORMAL HIGH (ref 30–100)

## 2019-05-20 ENCOUNTER — Other Ambulatory Visit: Payer: Self-pay | Admitting: Internal Medicine

## 2019-05-20 DIAGNOSIS — I10 Essential (primary) hypertension: Secondary | ICD-10-CM

## 2019-05-23 ENCOUNTER — Other Ambulatory Visit: Payer: Self-pay | Admitting: Physician Assistant

## 2019-06-26 ENCOUNTER — Other Ambulatory Visit: Payer: Self-pay | Admitting: Internal Medicine

## 2019-07-26 DIAGNOSIS — I10 Essential (primary) hypertension: Secondary | ICD-10-CM | POA: Diagnosis not present

## 2019-07-26 DIAGNOSIS — M5126 Other intervertebral disc displacement, lumbar region: Secondary | ICD-10-CM | POA: Diagnosis not present

## 2019-07-26 DIAGNOSIS — Z5181 Encounter for therapeutic drug level monitoring: Secondary | ICD-10-CM | POA: Diagnosis not present

## 2019-07-26 DIAGNOSIS — Z6826 Body mass index (BMI) 26.0-26.9, adult: Secondary | ICD-10-CM | POA: Diagnosis not present

## 2019-08-02 ENCOUNTER — Other Ambulatory Visit: Payer: Self-pay | Admitting: Internal Medicine

## 2019-08-02 MED ORDER — DICLOFENAC SODIUM 75 MG PO TBEC: DELAYED_RELEASE_TABLET | ORAL | 1 refills | Status: DC

## 2019-08-08 NOTE — Progress Notes (Signed)
3 MONTH FOLLOW UP   Assessment:   Diagnoses and all orders for this visit:   Essential hypertension Continue Valsartan/hctz 160-25mg  daily Monitor blood pressure at home regularly; call if consistently over 130/80 Continue DASH diet.   Reminder to go to the ER if any CP, SOB, nausea, dizziness, severe HA, changes vision/speech, left arm numbness and tingling and jaw pain.  Other male erectile dysfunction Treated by Cialis PRN; no concerns or complications Established with urology; follow up PRN  Hyperlipidemia, unspecified hyperlipidemia type Well controlled; continue statin medication Continue low cholesterol diet and exercise.  -     Lipid panel -     TSH  TOBACCO ABUSE Discussed risks associated with tobacco use and advised to reduce or quit Patient is not ready to do so, but advised to consider strongly - he has picked up chantix prescription and considering started but not at this time. information provided Will follow up at the next visit CT screening neg 09/2018; repeat annually    Right bundle branch block (RBBB) with left anterior fascicular block (LAFB) Evaluated by cardiology; no further interventions recommended unless progresses and symptomatic Continue annual EKG yearly and as indicated  Other abnormal glucose A1C's well controlled; previously prediabetic Discussed disease and risks Discussed diet/exercise, weight management  -     CMP WITH GFR  Vitamin D deficiency At goal; continue supplementation Defer vitamin D level to next visit  Medication management Continued CKD (chronic kidney disease) stage 2, GFR 60-89 ml/min Increase fluids  Avoid NSAIDS Blood pressure control Monitor sugars  Will continue to monitor  GERD Doing well at this time Diet discussed Monitor for triggers Avoid food with high acid content Avoid excessive cafeine Increase water intake  Primary Insonmia: Doing well at this time Taking Trazadone 2 tablets at night  PRN   [name], counseling, chart review, and critical decision making was performed  Future Appointments  Date Time Provider [place]  08/09/2019 10:45 AM Garnet Sierras, NP [facility] None  11/14/2019 11:00 AM Unk Pinto, MD [facility] None  02/16/2020  2:00 PM Liane Comber, NP [facility] None     Subjective:  Arthur Garcia is a 68 y.o. male who presents for 6month follow up, HTN, HLD, prediabetes, ED, GERD and vitamin D Def.   He has hx of DDD s/p lumbar discectomy x 2 by Dr. Vertell Limber in 2016 - he continues to experience pain and discomfort related to this, currently prescribed percocet and neurontin and managed by pain management via Dr. Melven Sartorius office.   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 He reports he does not drink water regularly.  He drinks iced and diet coke.       He currently continues to smoke 1 pack a day; discussed risks associated with smoking, patient is not ready to quit - he is considering quitting or cutting back but is not ready to do so. He has been prescribed chantix and did pick up from pharmacy. He has 30+ pack/yr smoking history and recently underwent low dose CT screening in 09/2018 which was negative.   BMI is There is no height or weight on file to calculate BMI., he has been working on diet and exercise, walks ~30 min daily with dog, working on farm and yard work. Wt Readings from Last 3 Encounters:  05/03/19 209 lb 12.8 oz (95.2 kg)  02/08/19 205 lb (93 kg)  10/01/18 207 lb 12.8 oz (94.3 kg)  He has not been checking BPs at home regularly, today their BP is   He is taking valsartan/hctz combo.  He does workout. He denies chest pain, shortness of breath, dizziness.   He is on cholesterol medication (atorvastatin 20 mg daily) and denies myalgias. His cholesterol is at goal. The cholesterol last visit was:   Lab Results  Component Value Date   CHOL 120 05/03/2019   HDL 42 05/03/2019    LDLCALC 63 05/03/2019   TRIG 72 05/03/2019   CHOLHDL 2.9 05/03/2019   He has been working on diet and exercise for glucose management, and denies increased appetite, nausea, paresthesia of the feet, polydipsia, polyuria, visual disturbances and vomiting. Last A1C in the office was:  Lab Results  Component Value Date   HGBA1C 5.5 05/03/2019   Last GFR Lab Results  Component Value Date   GFRNONAA 3 (L) 05/03/2019    Patient is on Vitamin D supplement and at goal at most recent check:    Lab Results  Component Value Date   VD25OH 125 (H) 05/03/2019      Medication Review: Current Outpatient Medications on File Prior to Visit  Medication Sig Dispense Refill  . atorvastatin (LIPITOR) 20 MG tablet Take 1 tablet Daily for Cholesterol 90 tablet 3  . Cholecalciferol (VITAMIN D-3) 5000 UNITS TABS Take 10,000 Units by mouth.     . diclofenac (VOLTAREN) 75 MG EC tablet Take 1 tablet 3 x /day with Food is needed for Pain & Inflammaton 270 tablet 1  . gabapentin (NEURONTIN) 300 MG capsule Take 300 mg by mouth 3 (three) times daily.     . traZODone (DESYREL) 50 MG tablet TAKE 1-2 TABLETS BY MOUTH 30 MINUTES PRIOR TO BEDTIME. 180 tablet 0  . valsartan-hydrochlorothiazide (DIOVAN-HCT) 160-25 MG tablet TAKE 1 TABLET DAILY FOR BLOOD PRESSURE 90 tablet 1  . varenicline (CHANTIX) 1 MG tablet Take 1 mg by mouth 2 (two) times daily.     No current facility-administered medications on file prior to visit.     Allergies: No Known Allergies  Current Problems (verified) has Hyperlipidemia, mixed; Smoker; ED (erectile dysfunction); Right bundle branch block (RBBB) with left anterior fascicular block (LAFB); Essential hypertension; Vitamin D deficiency; Medication management; CKD (chronic kidney disease) stage 2, GFR 60-89 ml/min; Gastroesophageal reflux disease; Other abnormal glucose; and Overweight (BMI 25.0-29.9) on their problem list.  Screening Tests Immunization History  Administered Date(s)  Administered  . Influenza Split 07/05/2014  . Influenza, High Dose Seasonal PF 08/21/2015  . Pneumococcal Conjugate-13 05/20/2016  . Pneumococcal Polysaccharide-23 10/06/2012  . Td 09/29/2006, 07/10/2017  . Zoster 08/21/2015   Preventative care: Last colonoscopy: 2008  Cologuard: 07/2017 neg due 2021  Prior vaccinations: TD or Tdap: 2018  Influenza: 2016 declines Pneumococcal: 2014, get at next office visit Prevnar13: 2017 Shingles/Zostavax: 2016  Names of Other Physician/Practitioners you currently use: 1. [facility] here for primary care 2. Dr. Delman Cheadle, eye doctor, last visit 2019, goes q12 months, no issues 3. Dr. Lavone Neri, dentist, last visit 2019, goes q63m  Patient Care Team: Unk Pinto, MD as PCP - General (Internal Medicine) Erline Levine, MD as Consulting Physician (Neurosurgery) Ladene Artist, MD as Consulting Physician (Gastroenterology) Sharyne Peach, MD as Consulting Physician (Ophthalmology) Minus Breeding, MD as Consulting Physician (Cardiology) Druscilla Brownie, MD as Consulting Physician (Dermatology)  Surgical: He  has a past surgical history that includes Uvuectomy; Appendectomy; Vasectomy; Adenoidectomy; Spine surgery (Aug 2016); and Dental surgery. Family His family history includes Glaucoma  in his mother; Heart disease in his father; Heart disease (age of onset: 19) in his paternal grandfather; Hyperlipidemia in his father; Hypertension in his father and mother; Osteoporosis in his mother; Thyroid disease in his father and mother. Social history  He reports that he has been smoking cigarettes. He has a 44.00 pack-year smoking history. He has never used smokeless tobacco. He reports current alcohol use of about 20.0 standard drinks of alcohol per week. He reports that he does not use drugs.    Objective:   There were no vitals filed for this visit. There is no height or weight on file to calculate  BMI.  Physical Exam:  General Appearance: Well nourished, in no apparent distress. Eyes: PERRLA, EOMs, conjunctiva no swelling or erythema ENT: Hearing normal.  Respiratory: Respiratory effort normal, BS equal bilaterally without rales, rhonchi, wheezing or stridor.  Cardio: RRR with no MRGs. Brisk peripheral pulses , no edema nNoted. Abdomen: Soft, obese/mildly distended, + BS.  Non tender, no guarding, rebound, hernias, masses. Lymphatics: Non tender without lymphadenopathy.  Musculoskeletal: Full ROM, 5/5 strength, normal gait.  Skin: Warm, dry without rashes, lesions; he has fragile skin and numerous small ecchymoses to bilateral upper extremities Neuro: Cranial nerves intact. Normal muscle tone, no cerebellar symptoms. Sensation intact.  Psych: Awake and oriented X 3, normal affect, Insight and Judgment appropriate.       Garnet Sierras, NP [facility] 08/09/2019  11:29 PM

## 2019-08-09 ENCOUNTER — Ambulatory Visit (INDEPENDENT_AMBULATORY_CARE_PROVIDER_SITE_OTHER): Payer: Medicare Other | Admitting: Adult Health Nurse Practitioner

## 2019-08-09 ENCOUNTER — Encounter: Payer: Self-pay | Admitting: Adult Health Nurse Practitioner

## 2019-08-09 ENCOUNTER — Other Ambulatory Visit: Payer: Self-pay

## 2019-08-09 VITALS — BP 140/72 | HR 62 | Temp 97.3°F | Ht 74.0 in | Wt 210.0 lb

## 2019-08-09 DIAGNOSIS — R7309 Other abnormal glucose: Secondary | ICD-10-CM | POA: Diagnosis not present

## 2019-08-09 DIAGNOSIS — F5101 Primary insomnia: Secondary | ICD-10-CM

## 2019-08-09 DIAGNOSIS — Z1389 Encounter for screening for other disorder: Secondary | ICD-10-CM

## 2019-08-09 DIAGNOSIS — I10 Essential (primary) hypertension: Secondary | ICD-10-CM | POA: Diagnosis not present

## 2019-08-09 DIAGNOSIS — N528 Other male erectile dysfunction: Secondary | ICD-10-CM | POA: Diagnosis not present

## 2019-08-09 DIAGNOSIS — E663 Overweight: Secondary | ICD-10-CM

## 2019-08-09 DIAGNOSIS — Z23 Encounter for immunization: Secondary | ICD-10-CM | POA: Diagnosis not present

## 2019-08-09 DIAGNOSIS — I452 Bifascicular block: Secondary | ICD-10-CM

## 2019-08-09 DIAGNOSIS — F17200 Nicotine dependence, unspecified, uncomplicated: Secondary | ICD-10-CM

## 2019-08-09 DIAGNOSIS — E782 Mixed hyperlipidemia: Secondary | ICD-10-CM | POA: Diagnosis not present

## 2019-08-09 DIAGNOSIS — E559 Vitamin D deficiency, unspecified: Secondary | ICD-10-CM

## 2019-08-09 DIAGNOSIS — N182 Chronic kidney disease, stage 2 (mild): Secondary | ICD-10-CM | POA: Diagnosis not present

## 2019-08-09 DIAGNOSIS — K219 Gastro-esophageal reflux disease without esophagitis: Secondary | ICD-10-CM

## 2019-08-09 DIAGNOSIS — Z79899 Other long term (current) drug therapy: Secondary | ICD-10-CM

## 2019-08-09 DIAGNOSIS — Z0000 Encounter for general adult medical examination without abnormal findings: Secondary | ICD-10-CM

## 2019-08-09 NOTE — Patient Instructions (Addendum)
You received your Influenza HD vaccination today.  The injection site will be sore ove rnext couple of days.  You could have slightly elevated temperature, 99. This is your bodies normal reaction to the vaccination. Please contact the office if you have increasing redness around the injection site or temperature 100.4 or greater.  Rabies Vaccination is good for 10 years.   Ask insurance and pharmacy about shingrix - new vaccine   Can go to [email or web address] for more information  Shingrix Vaccination  Two vaccines are licensed and recommended to prevent shingles in the [place] Zoster vaccine live (ZVL, Zostavax) has been in use since 2006. Recombinant zoster vaccine (RZV, Shingrix), has been in use since 2017 and is recommended by ACIP as the preferred shingles vaccine.  What Everyone Should Know about Shingles Vaccine (Shingrix) One of the Recommended Vaccines by Disease Shingles vaccination is the only way to protect against shingles and postherpetic neuralgia (PHN), the most common complication from shingles. CDC recommends that healthy adults 50 years and older get two doses of the shingles vaccine called Shingrix (recombinant zoster vaccine), separated by 2 to 6 months, to prevent shingles and the complications from the disease. Your doctor or pharmacist can give you Shingrix as a shot in your upper arm. Shingrix provides strong protection against shingles and PHN. Two doses of Shingrix is more than 90% effective at preventing shingles and PHN. Protection stays above 85% for at least the first four years after you get vaccinated. Shingrix is the preferred vaccine, over Zostavax (zoster vaccine live), a shingles vaccine in use since 2006. Zostavax may still be used to prevent shingles in healthy adults 60 years and older. For example, you could use Zostavax if a person is allergic to Shingrix, prefers Zostavax, or requests immediate vaccination  and Shingrix is unavailable. Who Should Get Shingrix? Healthy adults 50 years and older should get two doses of Shingrix, separated by 2 to 6 months. You should get Shingrix even if in the past you . had shingles  . received Zostavax  . are not sure if you had chickenpox There is no maximum age for getting Shingrix. If you had shingles in the past, you can get Shingrix to help prevent future occurrences of the disease. There is no specific length of time that you need to wait after having shingles before you can receive Shingrix, but generally you should make sure the shingles rash has gone away before getting vaccinated. You can get Shingrix whether or not you remember having had chickenpox in the past. Studies show that more than 99% of Americans 40 years and older have had chickenpox, even if they don't remember having the disease. Chickenpox and shingles are related because they are caused by the same virus (varicella zoster virus). After a person recovers from chickenpox, the virus stays dormant (inactive) in the body. It can reactivate years later and cause shingles. If you had Zostavax in the recent past, you should wait at least eight weeks before getting Shingrix. Talk to your healthcare provider to determine the best time to get Shingrix. Shingrix is available in [employer] [facility] and pharmacies. To find doctor's offices or pharmacies near you that offer the vaccine, visit HealthMap Vaccine FinderExternal. If you have questions about Shingrix, talk with your healthcare provider. Vaccine for Those 72 Years and Older  Shingrix reduces the risk of shingles and PHN by more than 90% in people 1 and older. CDC recommends the vaccine for healthy adults 40 and older.  Who Should  Not Get Shingrix? You should not get Shingrix if you: . have ever had a severe allergic reaction to any component of the vaccine or after a dose of Shingrix  . tested negative for immunity to varicella zoster virus. If  you test negative, you should get chickenpox vaccine.  . currently have shingles  . currently are pregnant or breastfeeding. Women who are pregnant or breastfeeding should wait to get Shingrix.  Marland Kitchen receive specific antiviral drugs (acyclovir, famciclovir, or valacyclovir) 24 hours before vaccination (avoid use of these antiviral drugs for 14 days after vaccination)- zoster vaccine live only If you have a minor acute (starts suddenly) illness, such as a cold, you may get Shingrix. But if you have a moderate or severe acute illness, you should usually wait until you recover before getting the vaccine. This includes anyone with a temperature of 101.30F or higher. The side effects of the Shingrix are temporary, and usually last 2 to 3 days. While you may experience pain for a few days after getting Shingrix, the pain will be less severe than having shingles and the complications from the disease. How Well Does Shingrix Work? Two doses of Shingrix provides strong protection against shingles and postherpetic neuralgia (PHN), the most common complication of shingles. . In adults 92 to 69 years old who got two doses, Shingrix was 97% effective in preventing shingles; among adults 70 years and older, Shingrix was 91% effective.  . In adults 71 to 69 years old who got two doses, Shingrix was 91% effective in preventing PHN; among adults 70 years and older, Shingrix was 89% effective. Shingrix protection remained high (more than 85%) in people 70 years and older throughout the four years following vaccination. Since your risk of shingles and PHN increases as you get older, it is important to have strong protection against shingles in your older years. Top of Page  What Are the Possible Side Effects of Shingrix? Studies show that Shingrix is safe. The vaccine helps your body create a strong defense against shingles. As a result, you are likely to have temporary side effects from getting the shots. The side effects  may affect your ability to do normal daily activities for 2 to 3 days. Most people got a sore arm with mild or moderate pain after getting Shingrix, and some also had redness and swelling where they got the shot. Some people felt tired, had muscle pain, a headache, shivering, fever, stomach pain, or nausea. About 1 out of 6 people who got Shingrix experienced side effects that prevented them from doing regular activities. Symptoms went away on their own in about 2 to 3 days. Side effects were more common in younger people. You might have a reaction to the first or second dose of Shingrix, or both doses. If you experience side effects, you may choose to take over-the-counter pain medicine such as ibuprofen or acetaminophen. If you experience side effects from Shingrix, you should report them to the Vaccine Adverse Event Reporting System (VAERS). Your doctor might file this report, or you can do it yourself through the VAERS websiteExternal, or by calling [phone]. If you have any questions about side effects from Shingrix, talk with your doctor. The shingles vaccine does not contain thimerosal (a preservative containing mercury). Top of Page  When Should I See a Doctor Because of the Side Effects I Experience From Shingrix? In clinical trials, Shingrix was not associated with serious adverse events. In fact, serious side effects from vaccines are extremely rare. For  example, for every 1 million doses of a vaccine given, only one or two people may have a severe allergic reaction. Signs of an allergic reaction happen within minutes or hours after vaccination and include hives, swelling of the face and throat, difficulty breathing, a fast heartbeat, dizziness, or weakness. If you experience these or any other life-threatening symptoms, see a doctor right away. Shingrix causes a strong response in your immune system, so it may produce short-term side effects more intense than you are used to from other  vaccines. These side effects can be uncomfortable, but they are expected and usually go away on their own in 2 or 3 days. Top of Page  How Can I Pay For Shingrix? There are several ways shingles vaccine may be paid for: Medicare . Medicare Part D plans cover the shingles vaccine, but there may be a cost to you depending on your plan. There may be a copay for the vaccine, or you may need to pay in full then get reimbursed for a certain amount.  . Medicare Part B does not cover the shingles vaccine. Medicaid . Medicaid may or may not cover the vaccine. Contact your insurer to find out. Private health insurance . Many private health insurance plans will cover the vaccine, but there may be a cost to you depending on your plan. Contact your insurer to find out. Vaccine assistance programs . Some pharmaceutical companies provide vaccines to eligible adults who cannot afford them. You may want to check with the vaccine manufacturer, [employer], about Shingrix. If you do not currently have health insurance, learn more about affordable health coverage optionsExternal. To find doctor's offices or pharmacies near you that offer the vaccine, visit HealthMap Vaccine FinderExternal.      Constipation: Increase water intake Increasing fiber will also help.  You can get this from vegetables, high fiber cereals, prune juice.  Increasing activity can also help with this, walking If diet/activity adjustments alone are not working try one of the following below.  You may purchase these at any local pharmacy.  Colace (ducosate sodium) 100mg  tablet once a day.  This is a stool softener, not a stimulant.  So this will not cause you to rush to the bathroom.  If you find everyday is too much, try every other or every third day.  Miralax is another product, powder that can be mixed with water, juice or coffee.  Try once a day, if too much every other.  This medicine works to add bulk, water, to your stool to  reduce straining, not a stimulant.   Constipation, Adult Constipation is when a person:  Poops (has a bowel movement) fewer times in a week than normal.  Has a hard time pooping.  Has poop that is dry, hard, or bigger than normal. Follow these instructions at home: Eating and drinking   Eat foods that have a lot of fiber, such as: ? Fresh fruits and vegetables. ? Whole grains. ? Beans.  Eat less of foods that are high in fat, low in fiber, or overly processed, such as: ? [place] fries. ? Hamburgers. ? Cookies. ? Candy. ? Soda.  Drink enough fluid to keep your pee (urine) clear or pale yellow. You can try adding small amount of prune juice every morning. Stool softener, Colace, daily may help to keep from straining, this is not a stimulant.  General instructions  Exercise regularly or as told by your doctor.  Go to the restroom when you feel like you need  to poop. Do not hold it in.  Take over-the-counter and prescription medicines only as told by your doctor. These include any fiber supplements.  Do pelvic floor retraining exercises, such as: ? Doing deep breathing while relaxing your lower belly (abdomen). ? Relaxing your pelvic floor while pooping.  Watch your condition for any changes.  Keep all follow-up visits as told by your doctor. This is important. Contact a doctor if:  You have pain that gets worse.  You have a fever.  You have not pooped for 4 days.  You throw up (vomit).  You are not hungry.  You lose weight.  You are bleeding from the anus.  You have thin, pencil-like poop (stool). Get help right away if:  You have a fever, and your symptoms suddenly get worse.  You leak poop or have blood in your poop.  Your belly feels hard or bigger than normal (is bloated).  You have very bad belly pain.  You feel dizzy or you faint. This information is not intended to replace advice given to you by your health care provider. Make sure you  discuss any questions you have with your health care provider. Document Released: 03/03/2008 Document Revised: 08/28/2017 Document Reviewed: 03/05/2016 Elsevier Patient Education  [place] 1,000 mg   are recommended to help protect  against the Covid-19 and other Corona viruses.    Also it's recommended  to take  Zinc 50 mg  to help  protect against the Covid-19   and best place to get  is also on [employer]  and don't pay more than 6-8 cents /pill !  ================================ Coronavirus (COVID-19) Are you at risk?  Are you at risk for the Coronavirus (COVID-19)?  To be considered HIGH RISK for Coronavirus (COVID-19), you have to meet the following criteria:  . Traveled to [place], [place], [place], [place] or [place]; or in the [place] to [place], [place], [place]  . or [place]; and have fever, cough, and shortness of breath within the last 2 weeks of travel OR . Been in close contact with a person diagnosed with COVID-19 within the last 2 weeks and have  . fever, cough,and shortness of breath .  . IF YOU DO NOT MEET THESE CRITERIA, YOU ARE CONSIDERED LOW RISK FOR COVID-19.  What to do if you are HIGH RISK for COVID-19?  Marland Kitchen If you are having a medical emergency, call 911. . Seek medical care right away. Before you go to a doctor's office, urgent care or emergency department, .  call ahead and tell them about your recent travel, contact with someone diagnosed with COVID-19  .  and your symptoms.  . You should receive instructions from your physician's office regarding next steps of care.  . When you arrive at healthcare provider, tell the healthcare staff immediately you have returned from  . visiting [place], [place], [place], [place] or [place]; or traveled in the [place] to [place], [place],  . [place] or [place] in the last two weeks or you have been in close contact with a person diagnosed with  . COVID-19 in  the last 2 weeks.   . Tell the health care staff about your symptoms: fever, cough and shortness of breath. . After you have been seen by a medical provider, you will be either: o Tested for (COVID-19) and discharged home on quarantine except to seek medical care if  o symptoms worsen, and asked to  - Stay home and avoid contact with others until you get your results (4-5 days)  - Avoid travel on public transportation if possible (such as bus, train, or airplane) or o Sent to the Emergency Department by EMS for evaluation, COVID-19 testing  and  o possible admission depending on your condition and test results.  What to do if you are LOW RISK for COVID-19?  Reduce your risk of any infection by using the same precautions used for avoiding the common cold or flu:  Marland Kitchen Wash your hands often with soap and warm water for at least 20 seconds.  If soap and water are not readily available,  . use an alcohol-based hand sanitizer with at least 60% alcohol.  . If coughing or sneezing, cover your mouth and nose by coughing or sneezing into the elbow areas of your shirt or coat, .  into a tissue or into your sleeve (not your hands). . Avoid shaking hands with others and consider head nods or verbal greetings only. . Avoid touching your eyes, nose, or mouth with unwashed hands.  . Avoid close contact with people who are sick. . Avoid places or events with large numbers of people in one location, like concerts or sporting events. . Carefully consider travel plans you have or are making. . If you are planning any travel outside or inside the [place], visit the CDC's Travelers' Health webpage for the latest health notices. . If you have some symptoms but not all symptoms, continue to monitor at home and seek medical attention  . if your symptoms worsen. . If you are having a medical emergency, call 911. >>>>>>>>>>>>>>>>>>>>>>>>>>>>>>>>>>>>>>>>>>>>>>>>>>>>>>> We Do NOT Approve of  [facility],  [place] Soliciting Our Patients  To Do Home Visits  & We Do NOT Approve of [employer] > > > > > > > > > > > > > > > > > > > > > > > > > > > > > > > > > > >  > > > >   Preventive Care for Adults  A healthy lifestyle and preventive care can promote health and wellness. Preventive health guidelines for men include the following key practices:  A routine yearly physical is a good way to check with your health care provider about your health and preventative screening. It is a chance to share any concerns and updates on your health and to receive a thorough exam.  Visit your dentist for a routine exam and preventative care every 6 months. Brush your teeth twice a day and floss once a day. Good oral hygiene prevents tooth decay and gum disease.  The frequency of eye exams is based on your age, health, family medical history, use of contact lenses, and other factors. Follow your health care provider's recommendations for frequency of eye exams.  Eat a healthy diet. Foods such as vegetables, fruits, whole grains, low-fat dairy products, and lean protein foods contain the nutrients you need without too many calories. Decrease your intake of foods high in solid fats, added sugars, and salt. Eat the right amount of calories for you. Get information about a proper diet from your health care provider, if necessary.  Regular physical exercise is one of the most important things you can do for your health. Most adults should get at least 150 minutes of moderate-intensity exercise (any activity that increases your heart rate and causes you to sweat) each week. In addition, most adults  need muscle-strengthening exercises on 2 or more days a week.  Maintain a healthy weight. The body mass index (BMI) is a screening tool to identify possible weight problems. It provides an estimate of body fat based on height and weight. Your health care provider can find your BMI and can help you achieve or  maintain a healthy weight. For adults 20 years and older:  A BMI below 18.5 is considered underweight.  A BMI of 18.5 to 24.9 is normal.  A BMI of 25 to 29.9 is considered overweight.  A BMI of 30 and above is considered obese.  Maintain normal blood lipids and cholesterol levels by exercising and minimizing your intake of saturated fat. Eat a balanced diet with plenty of fruit and vegetables. Blood tests for lipids and cholesterol should begin at age 50 and be repeated every 5 years. If your lipid or cholesterol levels are high, you are over 50, or you are at high risk for heart disease, you may need your cholesterol levels checked more frequently. Ongoing high lipid and cholesterol levels should be treated with medicines if diet and exercise are not working.  If you smoke, find out from your health care provider how to quit. If you do not use tobacco, do not start.  Lung cancer screening is recommended for adults aged 73-80 years who are at high risk for developing lung cancer because of a history of smoking. A yearly low-dose CT scan of the lungs is recommended for people who have at least a 30-pack-year history of smoking and are a current smoker or have quit within the past 15 years. A pack year of smoking is smoking an average of 1 pack of cigarettes a day for 1 year (for example: 1 pack a day for 30 years or 2 packs a day for 15 years). Yearly screening should continue until the smoker has stopped smoking for at least 15 years. Yearly screening should be stopped for people who develop a health problem that would prevent them from having lung cancer treatment.  If you choose to drink alcohol, do not have more than 2 drinks per day. One drink is considered to be 12 ounces (355 mL) of beer, 5 ounces (148 mL) of wine, or 1.5 ounces (44 mL) of liquor.  Avoid use of street drugs. Do not share needles with anyone. Ask for help if you need support or instructions about stopping the use of  drugs.  High blood pressure causes heart disease and increases the risk of stroke. Your blood pressure should be checked at least every 1-2 years. Ongoing high blood pressure should be treated with medicines, if weight loss and exercise are not effective.  If you are 5-43 years old, ask your health care provider if you should take aspirin to prevent heart disease.  Diabetes screening involves taking a blood sample to check your fasting blood sugar level. Testing should be considered at a younger age or be carried out more frequently if you are overweight and have at least 1 risk factor for diabetes.  Colorectal cancer can be detected and often prevented. Most routine colorectal cancer screening begins at the age of 10 and continues through age 45. However, your health care provider may recommend screening at an earlier age if you have risk factors for colon cancer. On a yearly basis, your health care provider may provide home test kits to check for hidden blood in the stool. Use of a small camera at the end of a tube  to directly examine the colon (sigmoidoscopy or colonoscopy) can detect the earliest forms of colorectal cancer. Talk to your health care provider about this at age 13, when routine screening begins. Direct exam of the colon should be repeated every 5-10 years through age 56, unless early forms of precancerous polyps or small growths are found.  Hepatitis C blood testing is recommended for all people born from [birth year] through 1966 and any individual with known risks for hepatitis C.  Screening for abdominal aortic aneurysm (AAA)  by ultrasound is recommended for people who have history of high blood pressure or who are current or former smokers.  Healthy men should  receive prostate-specific antigen (PSA) blood tests as part of routine cancer screening. Talk with your health care provider about prostate cancer screening.  Testicular cancer screening is  recommended for adult males.  Screening includes self-exam, a health care provider exam, and other screening tests. Consult with your health care provider about any symptoms you have or any concerns you have about testicular cancer.  Use sunscreen. Apply sunscreen liberally and repeatedly throughout the day. You should seek shade when your shadow is shorter than you. Protect yourself by wearing long sleeves, pants, a wide-brimmed hat, and sunglasses year round, whenever you are outdoors.  Once a month, do a whole-body skin exam, using a mirror to look at the skin on your back. Tell your health care provider about new moles, moles that have irregular borders, moles that are larger than a pencil eraser, or moles that have changed in shape or color.  Stay current with required vaccines (immunizations).  Influenza vaccine. All adults should be immunized every year.  Tetanus, diphtheria, and acellular pertussis (Td, Tdap) vaccine. An adult who has not previously received Tdap or who does not know his vaccine status should receive 1 dose of Tdap. This initial dose should be followed by tetanus and diphtheria toxoids (Td) booster doses every 10 years. Adults with an unknown or incomplete history of completing a 3-dose immunization series with Td-containing vaccines should begin or complete a primary immunization series including a Tdap dose. Adults should receive a Td booster every 10 years.  Zoster vaccine. One dose is recommended for adults aged 76 years or older unless certain conditions are present.    PREVNAR - Pneumococcal 13-valent conjugate (PCV13) vaccine. When indicated, a person who is uncertain of his immunization history and has no record of immunization should receive the PCV13 vaccine. An adult aged 22 years or older who has certain medical conditions and has not been previously immunized should receive 1 dose of PCV13 vaccine. This PCV13 should be followed with a dose of pneumococcal polysaccharide (PPSV23) vaccine. The  PPSV23 vaccine dose should be obtained 1 or more year(s)after the dose of PCV13 vaccine. An adult aged 26 years or older who has certain medical conditions and previously received 1 or more doses of PPSV23 vaccine should receive 1 dose of PCV13. The PCV13 vaccine dose should be obtained 1 or more years after the last PPSV23 vaccine dose.    PNEUMOVAX - Pneumococcal polysaccharide (PPSV23) vaccine. When PCV13 is also indicated, PCV13 should be obtained first. All adults aged 26 years and older should be immunized. An adult younger than age 72 years who has certain medical conditions should be immunized. Any person who resides in a nursing home or long-term care facility should be immunized. An adult smoker should be immunized. People with an immunocompromised condition and certain other conditions should receive both PCV13 and PPSV23  vaccines. People with human immunodeficiency virus (HIV) infection should be immunized as soon as possible after diagnosis. Immunization during chemotherapy or radiation therapy should be avoided. Routine use of PPSV23 vaccine is not recommended for American Indians, [place] Natives, or people younger than 65 years unless there are medical conditions that require PPSV23 vaccine. When indicated, people who have unknown immunization and have no record of immunization should receive PPSV23 vaccine. One-time revaccination 5 years after the first dose of PPSV23 is recommended for people aged 19-64 years who have chronic kidney failure, nephrotic syndrome, asplenia, or immunocompromised conditions. People who received 1-2 doses of PPSV23 before age 52 years should receive another dose of PPSV23 vaccine at age 53 years or later if at least 5 years have passed since the previous dose. Doses of PPSV23 are not needed for people immunized with PPSV23 at or after age 11 years.    Hepatitis A vaccine. Adults who wish to be protected from this disease, have certain high-risk conditions, work  with hepatitis A-infected animals, work in hepatitis A research labs, or travel to or work in countries with a high rate of hepatitis A should be immunized. Adults who were previously unvaccinated and who anticipate close contact with an international adoptee during the first 60 days after arrival in the [place] from a country with a high rate of hepatitis A should be immunized.    Hepatitis B vaccine. Adults should be immunized if they wish to be protected from this disease, have certain high-risk conditions, may be exposed to blood or other infectious body fluids, are household contacts or sex partners of hepatitis B positive people, are clients or workers in certain care facilities, or travel to or work in countries with a high rate of hepatitis B.   Preventive Service / Frequency   Ages 48 and over  Blood pressure check.  Lipid and cholesterol check.  Lung cancer screening. / Every year if you are aged 15-80 years and have a 30-pack-year history of smoking and currently smoke or have quit within the past 15 years. Yearly screening is stopped once you have quit smoking for at least 15 years or develop a health problem that would prevent you from having lung cancer treatment.  Fecal occult blood test (FOBT) of stool. You may not have to do this test if you get a colonoscopy every 10 years.  Flexible sigmoidoscopy** or colonoscopy.** / Every 5 years for a flexible sigmoidoscopy or every 10 years for a colonoscopy beginning at age 25 and continuing until age 14.  Hepatitis C blood test.** / For all people born from [birth year] through 1966 and any individual with known risks for hepatitis C.  Abdominal aortic aneurysm (AAA) screening./ Screening current or former smokers or have Hypertension.  Skin self-exam. / Monthly.  Influenza vaccine. / Every year.  Tetanus, diphtheria, and acellular pertussis (Tdap/Td) vaccine.** / 1 dose of Td every 10 years.   Zoster vaccine.** / 1 dose for  adults aged 31 years or older.         Pneumococcal 13-valent conjugate (PCV13) vaccine.    Pneumococcal polysaccharide (PPSV23) vaccine.     Hepatitis A vaccine.** / Consult your health care provider.  Hepatitis B vaccine.** / Consult your health care provider. Screening for abdominal aortic aneurysm (AAA)  by ultrasound is recommended for people who have history of high blood pressure or who are current or former smokers. ++++++++++ Recommend Adult Low Dose Aspirin or  coated  Aspirin 81 mg daily  To reduce risk of Colon Cancer 40 %,  Skin Cancer 26 % ,  Malignant Melanoma 46%  and  Pancreatic cancer 60% ++++++++++++++++++++++ Vitamin D goal  is between 70-100.  Please make sure that you are taking your Vitamin D as directed.  It is very important as a natural anti-inflammatory  helping hair, skin, and nails, as well as reducing stroke and heart attack risk.  It helps your bones and helps with mood. It also decreases numerous cancer risks so please take it as directed.  Low Vit D is associated with a 200-300% higher risk for CANCER  and 200-300% higher risk for HEART   ATTACK  &  STROKE.   .....................................Marland Kitchen It is also associated with higher death rate at younger ages,  autoimmune diseases like Rheumatoid arthritis, Lupus, Multiple Sclerosis.    Also many other serious conditions, like depression, Alzheimer's Dementia, infertility, muscle aches, fatigue, fibromyalgia - just to name a few. ++++++++++++++++++++++ Recommend the book "The END of DIETING" by Dr Excell Seltzer  & the book "The END of DIABETES " by Dr Excell Seltzer At [email or web address] - get book & Audio CD's    Being diabetic has a  300% increased risk for heart attack, stroke, cancer, and alzheimer- type vascular dementia. It is very important that you work harder with diet by avoiding all foods that are white. Avoid white rice (brown & wild rice is OK), white potatoes (sweetpotatoes in moderation is  OK), White bread or wheat bread or anything made out of white flour like bagels, donuts, rolls, buns, biscuits, cakes, pastries, cookies, pizza crust, and pasta (made from white flour & egg whites) - vegetarian pasta or spinach or wheat pasta is OK. Multigrain breads like Arnold's or Pepper[employer], or multigrain sandwich thins or flatbreads.  Diet, exercise and weight loss can reverse and cure diabetes in the early stages.  Diet, exercise and weight loss is very important in the control and prevention of complications of diabetes which affects every system in your body, ie. Brain - dementia/stroke, eyes - glaucoma/blindness, heart - heart attack/heart failure, kidneys - dialysis, stomach - gastric paralysis, intestines - malabsorption, nerves - severe painful neuritis, circulation - gangrene & loss of a leg(s), and finally cancer and Alzheimers.    I recommend avoid fried & greasy foods,  sweets/candy, white rice (brown or wild rice or Quinoa is OK), white potatoes (sweet potatoes are OK) - anything made from white flour - bagels, doughnuts, rolls, buns, biscuits,white and wheat breads, pizza crust and traditional pasta made of white flour & egg white(vegetarian pasta or spinach or wheat pasta is OK).  Multi-grain bread is OK - like multi-grain flat bread or sandwich thins. Avoid alcohol in excess. Exercise is also important.    Eat all the vegetables you want - avoid meat, especially red meat and dairy - especially cheese.  Cheese is the most concentrated form of trans-fats which is the worst thing to clog up our arteries. Veggie cheese is OK which can be found in the fresh produce section at [employer] or [employer] or [employer]  ++++++++++++++++++++++ DASH Eating Plan  DASH stands for "Dietary Approaches to Stop Hypertension."   The DASH eating plan is a healthy eating plan that has been shown to reduce high blood pressure (hypertension). Additional health benefits may include reducing the risk  of type 2 diabetes mellitus, heart disease, and stroke. The DASH eating plan may also help with weight loss. WHAT DO I NEED TO KNOW ABOUT THE  DASH EATING PLAN? For the DASH eating plan, you will follow these general guidelines:  Choose foods with a percent daily value for sodium of less than 5% (as listed on the food label).  Use salt-free seasonings or herbs instead of table salt or sea salt.  Check with your health care provider or pharmacist before using salt substitutes.  Eat lower-sodium products, often labeled as "lower sodium" or "no salt added."  Eat fresh foods.  Eat more vegetables, fruits, and low-fat dairy products.  Choose whole grains. Look for the word "whole" as the first word in the ingredient list.  Choose fish   Limit sweets, desserts, sugars, and sugary drinks.  Choose heart-healthy fats.  Eat veggie cheese   Eat more home-cooked food and less restaurant, buffet, and fast food.  Limit fried foods.  Cook foods using methods other than frying.  Limit canned vegetables. If you do use them, rinse them well to decrease the sodium.  When eating at a restaurant, ask that your food be prepared with less salt, or no salt if possible.                      WHAT FOODS CAN I EAT? Read Dr Fara Olden Fuhrman's books on The End of Dieting & The End of Diabetes  Grains Whole grain or whole wheat bread. Brown rice. Whole grain or whole wheat pasta. Quinoa, bulgur, and whole grain cereals. Low-sodium cereals. Corn or whole wheat flour tortillas. Whole grain cornbread. Whole grain crackers. Low-sodium crackers.  Vegetables Fresh or frozen vegetables (raw, steamed, roasted, or grilled). Low-sodium or reduced-sodium tomato and vegetable juices. Low-sodium or reduced-sodium tomato sauce and paste. Low-sodium or reduced-sodium canned vegetables.   Fruits All fresh, canned (in natural juice), or frozen fruits.  Protein Products  All fish and seafood.  Dried beans, peas, or  lentils. Unsalted nuts and seeds. Unsalted canned beans.  Dairy Low-fat dairy products, such as skim or 1% milk, 2% or reduced-fat cheeses, low-fat ricotta or cottage cheese, or plain low-fat yogurt. Low-sodium or reduced-sodium cheeses.  Fats and Oils Tub margarines without trans fats. Light or reduced-fat mayonnaise and salad dressings (reduced sodium). Avocado. Safflower, olive, or canola oils. Natural peanut or almond butter.  Other Unsalted popcorn and pretzels. The items listed above may not be a complete list of recommended foods or beverages. Contact your dietitian for more options.  ++++++++++++++++++++  WHAT FOODS ARE NOT RECOMMENDED? Grains/ White flour or wheat flour White bread. White pasta. White rice. Refined cornbread. Bagels and croissants. Crackers that contain trans fat.  Vegetables  Creamed or fried vegetables. Vegetables in a . Regular canned vegetables. Regular canned tomato sauce and paste. Regular tomato and vegetable juices.  Fruits Dried fruits. Canned fruit in light or heavy syrup. Fruit juice.  Meat and Other Protein Products Meat in general - RED meat & White meat.  Fatty cuts of meat. Ribs, chicken wings, all processed meats as bacon, sausage, bologna, salami, fatback, hot dogs, bratwurst and packaged luncheon meats.  Dairy Whole or 2% milk, cream, half-and-half, and cream cheese. Whole-fat or sweetened yogurt. Full-fat cheeses or blue cheese. Non-dairy creamers and whipped toppings. Processed cheese, cheese spreads, or cheese curds.  Condiments Onion and garlic salt, seasoned salt, table salt, and sea salt. Canned and packaged gravies. Worcestershire sauce. Tartar sauce. Barbecue sauce. Teriyaki sauce. Soy sauce, including reduced sodium. Steak sauce. Fish sauce. Oyster sauce. Cocktail sauce. Horseradish. Ketchup and mustard. Meat flavorings and tenderizers. Bouillon cubes. Hot sauce.  Tabasco sauce. Marinades. Taco seasonings. Relishes.  Fats and  Oils Butter, stick margarine, lard, shortening and bacon fat. Coconut, palm kernel, or palm oils. Regular salad dressings.  Pickles and olives. Salted popcorn and pretzels.  The items listed above may not be a complete list of foods and beverages to avoid.

## 2019-08-10 LAB — COMPLETE METABOLIC PANEL WITH GFR
AG Ratio: 2.3 (calc) (ref 1.0–2.5)
ALT: 27 U/L (ref 9–46)
AST: 24 U/L (ref 10–35)
Albumin: 4.5 g/dL (ref 3.6–5.1)
Alkaline phosphatase (APISO): 70 U/L (ref 35–144)
BUN: 20 mg/dL (ref 7–25)
CO2: 27 mmol/L (ref 20–32)
Calcium: 9.4 mg/dL (ref 8.6–10.3)
Chloride: 95 mmol/L — ABNORMAL LOW (ref 98–110)
Creat: 1.17 mg/dL (ref 0.70–1.25)
GFR, Est African American: 73 mL/min/{1.73_m2} (ref >=60)
GFR, Est Non African American: 63 mL/min/{1.73_m2} (ref >=60)
Globulin: 2 g/dL (calc) (ref 1.9–3.7)
Glucose, Bld: 109 mg/dL — ABNORMAL HIGH (ref 65–99)
Potassium: 4.7 mmol/L (ref 3.5–5.3)
Sodium: 130 mmol/L — ABNORMAL LOW (ref 135–146)
Total Bilirubin: 0.5 mg/dL (ref 0.2–1.2)
Total Protein: 6.5 g/dL (ref 6.1–8.1)

## 2019-08-10 LAB — CBC WITH DIFFERENTIAL/PLATELET
Absolute Monocytes: 414 cells/uL (ref 200–950)
Basophils Absolute: 60 {cells}/uL (ref 0–200)
Basophils Relative: 1.3 %
Eosinophils Absolute: 212 cells/uL (ref 15–500)
Eosinophils Relative: 4.6 %
HCT: 37.9 % — ABNORMAL LOW (ref 38.5–50.0)
Hemoglobin: 12.7 g/dL — ABNORMAL LOW (ref 13.2–17.1)
Lymphs Abs: 1155 cells/uL (ref 850–3900)
MCH: 30.6 pg (ref 27.0–33.0)
MCHC: 33.5 g/dL (ref 32.0–36.0)
MCV: 91.3 fL (ref 80.0–100.0)
MPV: 10.3 fL (ref 7.5–12.5)
Monocytes Relative: 9 %
Neutro Abs: 2760 cells/uL (ref 1500–7800)
Neutrophils Relative %: 60 %
Platelets: 231 10*3/uL (ref 140–400)
RBC: 4.15 10*6/uL — ABNORMAL LOW (ref 4.20–5.80)
RDW: 11.7 % (ref 11.0–15.0)
Total Lymphocyte: 25.1 %
WBC: 4.6 10*3/uL (ref 3.8–10.8)

## 2019-08-10 LAB — LIPID PANEL
Cholesterol: 114 mg/dL (ref <200)
HDL: 42 mg/dL (ref >=40)
LDL Cholesterol (Calc): 58 mg/dL (calc)
Non-HDL Cholesterol (Calc): 72 mg/dL (calc) (ref <130)
Total CHOL/HDL Ratio: 2.7 (calc) (ref <5.0)
Triglycerides: 65 mg/dL (ref <150)

## 2019-08-10 LAB — MAGNESIUM: Magnesium: 1.6 mg/dL (ref 1.5–2.5)

## 2019-08-10 LAB — VITAMIN D 25 HYDROXY (VIT D DEFICIENCY, FRACTURES): Vit D, 25-Hydroxy: 91 ng/mL (ref 30–100)

## 2019-08-15 ENCOUNTER — Other Ambulatory Visit: Payer: Self-pay

## 2019-08-15 ENCOUNTER — Ambulatory Visit (INDEPENDENT_AMBULATORY_CARE_PROVIDER_SITE_OTHER): Payer: Medicare Other | Admitting: Physician Assistant

## 2019-08-15 ENCOUNTER — Encounter: Payer: Self-pay | Admitting: Physician Assistant

## 2019-08-15 VITALS — BP 132/74 | HR 69 | Temp 97.5°F | Wt 210.0 lb

## 2019-08-15 DIAGNOSIS — M25561 Pain in right knee: Secondary | ICD-10-CM

## 2019-08-15 DIAGNOSIS — I2510 Atherosclerotic heart disease of native coronary artery without angina pectoris: Secondary | ICD-10-CM

## 2019-08-15 MED ORDER — DICLOFENAC EPOLAMINE 1.3 % EX PTCH: 1.0000 | MEDICATED_PATCH | Freq: Two times a day (BID) | CUTANEOUS | 0 refills | Status: DC

## 2019-08-15 NOTE — Progress Notes (Signed)
Subjective:    Patient ID: Arthur Garcia, male    DOB: [birth year], 69 y.o.   MRN: [id number]  HPI 68 y.o. WM present with right knee pain x 1 month.  He was on land beside a kayak, tried to step inside of it, the boat wobbled, he did not fall but he states his knee was jerked side to side, he did not hear a pop or have acute pain at the time however he had pain the following day. He also has been laying down concrete and stepping stones in his yard.   He has pain worse first thing in the morning and worse after sitting for a short while with walking. He has some discomfort with walking but states it gets better with walking. Pain is medial right knee, wife states swelling in back. States some instability with first walking but gets better, denies popping, clicking, catching or giving way. He has tried knee brace, ice, heat, he is on is diclofenac 75 BID, hydrocodone 2 a day, and gabapentin 3 a day for his back.   Blood pressure 132/74, pulse 69, temperature (!) 97.5 F (36.4 C), weight 210 lb (95.3 kg), SpO2 97 %.  Medications   Current Outpatient Medications (Cardiovascular):  .  atorvastatin (LIPITOR) 20 MG tablet, Take 1 tablet Daily for Cholesterol .  valsartan-hydrochlorothiazide (DIOVAN-HCT) 160-25 MG tablet, TAKE 1 TABLET DAILY FOR BLOOD PRESSURE   Current Outpatient Medications (Analgesics):  .  diclofenac (VOLTAREN) 75 MG EC tablet, Take 1 tablet 3 x /day with Food is needed for Pain & Inflammaton   Current Outpatient Medications (Other):  Marland Kitchen  Cholecalciferol (VITAMIN D-3) 5000 UNITS TABS, Take 10,000 Units by mouth.  .  gabapentin (NEURONTIN) 300 MG capsule, Take 300 mg by mouth 3 (three) times daily.  .  traZODone (DESYREL) 50 MG tablet, TAKE 1-2 TABLETS BY MOUTH 30 MINUTES PRIOR TO BEDTIME. .  varenicline (CHANTIX) 1 MG tablet, Take 1 mg by mouth 2 (two) times daily. .  Zinc 50 MG TABS, Take by mouth daily.  Problem list He has Hyperlipidemia, mixed; Smoker; ED  (erectile dysfunction); Right bundle branch block (RBBB) with left anterior fascicular block (LAFB); Essential hypertension; Vitamin D deficiency; Medication management; CKD (chronic kidney disease) stage 2, GFR 60-89 ml/min; Gastroesophageal reflux disease; Other abnormal glucose; and Overweight (BMI 25.0-29.9) on their problem list.  Review of Systems  Constitutional: Negative.  Negative for chills and fever.  HENT: Negative.   Respiratory: Negative.   Cardiovascular: Negative.   Gastrointestinal: Negative.   Genitourinary: Negative.  Negative for flank pain, frequency, hematuria and urgency.  Musculoskeletal: Positive for arthralgias and gait problem. Negative for back pain, joint swelling, myalgias, neck pain and neck stiffness.  Skin: Negative.  Negative for rash.  Neurological: Negative for tremors, weakness and numbness.  Psychiatric/Behavioral: Negative.        Objective:   Physical Exam Constitutional:      General: He is not in acute distress.    Appearance: He is well-developed.  Neck:     Musculoskeletal: Normal range of motion and neck supple.  Cardiovascular:     Rate and Rhythm: Normal rate and regular rhythm.     Heart sounds: No murmur.  Pulmonary:     Effort: Pulmonary effort is normal.     Breath sounds: Normal breath sounds. No wheezing.  Abdominal:     General: Bowel sounds are normal.     Palpations: Abdomen is soft.     Tenderness: There is  no abdominal tenderness.  Musculoskeletal:        General: Tenderness present. No swelling or deformity.     Comments: Patient is able to ambulate well.   Gait is  antalgic. right knee with crepitus, medial joint line tenderness and MCL laxity noted no effusion, no erythema, ACL stable, no patellar laxity, McMurray's negative and FROM   Lymphadenopathy:     Cervical: No cervical adenopathy.  Skin:    General: Skin is warm and dry.     Findings: No rash.  Neurological:     Mental Status: He is alert and oriented  to person, place, and time.     Cranial Nerves: No cranial nerve deficit.       Assessment & Plan:   Acute pain of right knee ? MCL strain versus OA with medial joint pain Will refer to PT and ortho for possible injections/evaluation -     Ambulatory referral to Orthopedic Surgery- [facility] -     Ambulatory referral to Physical Therapy -     diclofenac (FLECTOR) 1.3 % [facility]; Place 1 patch onto the skin 2 (two) times daily.

## 2019-08-15 NOTE — Patient Instructions (Signed)
Medial Collateral Knee Ligament Sprain  The medial collateral ligament (MCL) is a tough band of tissue in the knee that connects the thigh bone to the shin bone. Your MCL prevents your knee from moving too far inward and helps to keep your knee stable. An MCL sprain is a stretch or tear in the MCL. What are the causes? This condition may be caused by:  A hard, direct hit (trauma) to the inside of your knee. This is a common cause.  Your knee falling inward when you run, change directions quickly (cut), jump, or pivot.  Repeatedly overstretching the MCL. What increases the risk? The following factors make you more likely to develop this condition:  Playing contact sports, such as wrestling or football.  Participating in sports that involve sudden movements of cutting, twisting, or turning. These movements are common in hockey, skiing, and soccer.  Having weak hip and core muscles. What are the signs or symptoms? Symptoms of this condition include:  Pain on the inside of the knee.  Swelling in the knee.  Bruising around the knee.  Tenderness when pressing the inside of the knee.  Feeling unstable when you stand, like your knee will give way.  Difficulty walking on uneven surfaces. How is this diagnosed? This condition may be diagnosed based on:  Your medical history.  A physical exam.  Imaging tests, such as an X-ray, ultrasound, or MRI. During your physical exam, your health care provider will check for pain, limited motion, and instability. How is this treated? Treatment for this condition depends on how severe the injury is. Treatment may include:  Keeping weight off the knee until swelling and pain improve.  Raising (elevating) the knee above the level of your heart. This helps to reduce swelling.  Icing the knee. This helps to reduce swelling.  Taking an NSAID, such as ibuprofen. This helps to reduce pain and swelling.  Using a knee brace, elastic sleeve, or  crutches while the injury heals.  Using a knee brace when participating in athletic activities.  Doing rehab exercises (physical therapy).  Surgery. This may be needed if: ? Your MCL tore all the way through. ? Your knee is unstable. ? Your knee is not getting better with other treatments. Follow these instructions at home: If you have a brace or sleeve:  Wear it as told by your health care provider. Remove it only as told by your health care provider.  Loosen the brace or remove the sleeve if your toes tingle, become numb, or turn cold and blue.  Keep the brace or sleeve clean.  If the brace or sleeve is not waterproof: ? Do not let it get wet. ? Cover it with a watertight covering when you take a bath or shower. Managing pain, stiffness, and swelling   If directed, put ice on the inside area of your knee. ? If you have a removable brace or sleeve, remove it as told by your health care provider. ? Put ice in a plastic bag. ? Place a towel between your skin and the bag. ? Leave the ice on for 20 minutes, 2-3 times a day.  Move your foot and toes often to reduce stiffness and swelling.  Elevate the injured area above the level of your heart while you are sitting or lying down. Activity  Ask your health care provider when it is safe to drive if you have a brace or sleeve on your leg.  Return to your normal activities as told  by your health care provider. Ask your health care provider what activities are safe for you.  Do exercises as told by your health care provider.  Do not use the injured leg to support your body weight until your health care provider says that you can. Use crutches as told by your health care provider. General instructions  Take over-the-counter and prescription medicines only as told by your health care provider.  Do not use any products that contain nicotine or tobacco, such as cigarettes, e-cigarettes, and chewing tobacco. These can delay healing.  If you need help quitting, ask your health care provider.  Keep all follow-up visits as told by your health care provider. This is important. How is this prevented?  Warm up and stretch before being active.  Cool down and stretch after being active.  Give your body time to rest between periods of activity.  Make sure to use equipment that fits you.  Be safe and responsible while being active. This will help you avoid falls.  Do at least 150 minutes of moderate-intensity exercise each week, such as brisk walking or water aerobics.  Maintain physical fitness, including: ? Strength. ? Flexibility. ? Cardiovascular fitness. ? Endurance. Contact a health care provider if:  Your symptoms do not improve.  Your symptoms get worse. Summary  An MCL sprain is a knee injury that is caused by stretching the MCL too far. The injury can involve a tear in the MCL.  Treatment for this condition depends on how severe the injury is. It may include rest, wearing a brace, or surgery.  Do not use the injured leg to support your body weight until your health care provider says that you can. Use crutches as told by your health care provider.  Contact a health care provider if your symptoms do not get better or they get worse.  Keep all follow-up visits as told by your health care provider. This is important. This information is not intended to replace advice given to you by your health care provider. Make sure you discuss any questions you have with your health care provider. Document Released: 09/15/2005 Document Revised: 05/05/2018 Document Reviewed: 05/05/2018 Elsevier Patient Education  2020 [employer].

## 2019-08-16 ENCOUNTER — Other Ambulatory Visit: Payer: Self-pay | Admitting: Adult Health

## 2019-08-19 ENCOUNTER — Other Ambulatory Visit: Payer: Self-pay

## 2019-08-19 ENCOUNTER — Ambulatory Visit (INDEPENDENT_AMBULATORY_CARE_PROVIDER_SITE_OTHER): Payer: Medicare Other

## 2019-08-19 ENCOUNTER — Ambulatory Visit (INDEPENDENT_AMBULATORY_CARE_PROVIDER_SITE_OTHER): Payer: Medicare Other | Admitting: Orthopaedic Surgery

## 2019-08-19 DIAGNOSIS — M25561 Pain in right knee: Secondary | ICD-10-CM

## 2019-08-19 DIAGNOSIS — G8929 Other chronic pain: Secondary | ICD-10-CM

## 2019-08-19 DIAGNOSIS — I2510 Atherosclerotic heart disease of native coronary artery without angina pectoris: Secondary | ICD-10-CM | POA: Diagnosis not present

## 2019-08-19 NOTE — Progress Notes (Signed)
Office Visit Note   Patient: Arthur Garcia           Date of Birth: December 14, 1950           MRN: [id number] Visit Date: 08/19/2019              Requested by: Vicie Mutters, PA-C [address] [place],   [id number] PCP: Unk Pinto, MD   Assessment & Plan: Visit Diagnoses:  1. Chronic pain of right knee     Plan: Impression is acute right medial meniscal tear.  We will need to obtain an MRI to further assess.  Follow-up after the MRI.  Follow-Up Instructions: Return in about 2 weeks (around 09/02/2019).   Orders:  Orders Placed This Encounter  Procedures  . XR KNEE 3 VIEW RIGHT  . MR Knee Right w/o contrast   No orders of the defined types were placed in this encounter.     Procedures: No procedures performed   Clinical Data: No additional findings.   Subjective: Chief Complaint  Patient presents with  . Right Knee - Pain    [name] is a very pleasant 68 year old gentleman who comes into see me for injury to his right knee that he sustained 4 weeks ago while kayaking.  He was stepping into the kayak and twisted his knee felt immediate pain on the medial side.  He developed an effusion which has somewhat resolved.  He continues to have significant pain on the medial side of his knee that is sharp and stabbing.  He denies any numbness or tingling.  He has been taking gabapentin diclofenac and hydrocodone for chronic back pain.   Review of Systems  Constitutional: Negative.   All other systems reviewed and are negative.    Objective: Vital Signs: There were no vitals taken for this visit.  Physical Exam Vitals signs and nursing note reviewed.  Constitutional:      Appearance: He is well-developed.  HENT:     Head: Normocephalic and atraumatic.  Eyes:     Pupils: Pupils are equal, round, and reactive to light.  Neck:     Musculoskeletal: Neck supple.  Pulmonary:     Effort: Pulmonary effort is normal.  Abdominal:   Palpations: Abdomen is soft.  Musculoskeletal: Normal range of motion.  Skin:    General: Skin is warm.  Neurological:     Mental Status: He is alert and oriented to person, place, and time.  Psychiatric:        Behavior: Behavior normal.        Thought Content: Thought content normal.        Judgment: Judgment normal.     Ortho Exam Right knee exam shows a trace joint effusion.  He has significant medial joint line tenderness.  He has positive McMurray and positive Thessaly sign.  Collaterals and cruciates are stable. Specialty Comments:  No specialty comments available.  Imaging: Xr Knee 3 View Right  Result Date: 08/19/2019 No acute or structural abnormalities    PMFS History: Patient Active Problem List   Diagnosis Date Noted  . Overweight (BMI 25.0-29.9) 05/12/2018  . Other abnormal glucose 01/28/2018  . Gastroesophageal reflux disease 10/28/2017  . CKD (chronic kidney disease) stage 2, GFR 60-89 ml/min 10/27/2017  . Essential hypertension 02/12/2016  . Vitamin D deficiency 02/12/2016  . Medication management 02/12/2016  . Right bundle branch block (RBBB) with left anterior fascicular block (LAFB) 04/11/2015  . ED (erectile dysfunction) 04/05/2014  . Smoker 10/31/2010  .  Hyperlipidemia, mixed 10/30/2010   Past Medical History:  Diagnosis Date  . Abnormal liver function test   . Anxiety   . Colon polyp   . DDD (degenerative disc disease)   . ED (erectile dysfunction) 04/05/2014  . FHx: heart disease 01/28/2018  . GERD (gastroesophageal reflux disease)   . Hyperlipidemia   . Hypertension   . Insomnia   . Tobacco dependence     Family History  Problem Relation Age of Onset  . Thyroid disease Mother   . Hypertension Mother   . Osteoporosis Mother   . Glaucoma Mother   . Thyroid disease Father   . Hypertension Father   . Hyperlipidemia Father   . Heart disease Father        CHF  . Heart disease Paternal Grandfather 56       Long standing "heart trouble"     Past Surgical History:  Procedure Laterality Date  . ADENOIDECTOMY     age 28  . APPENDECTOMY    . DENTAL SURGERY     Dental implants  . SPINE SURGERY  Aug 2016   Dr. Vertell Limber  . Uvuectomy    . VASECTOMY     Social History   Occupational History  . Not on file  Tobacco Use  . Smoking status: Current Every Day Smoker    Packs/day: 1.00    Years: 44.00    Pack years: 44.00    Types: Cigarettes  . Smokeless tobacco: Never Used  . Tobacco comment: On and off  Substance and Sexual Activity  . Alcohol use: Yes    Alcohol/week: 20.0 standard drinks    Types: 20 Cans of beer per week  . Drug use: No  . Sexual activity: Not on file

## 2019-08-31 ENCOUNTER — Ambulatory Visit: Payer: Medicare Other | Admitting: Physical Therapy

## 2019-09-02 ENCOUNTER — Ambulatory Visit: Payer: Medicare Other | Admitting: Orthopaedic Surgery

## 2019-09-14 ENCOUNTER — Other Ambulatory Visit: Payer: Self-pay

## 2019-09-14 ENCOUNTER — Ambulatory Visit
Admission: RE | Admit: 2019-09-14 | Discharge: 2019-09-14 | Disposition: A | Discharge status: Home / Self Care | Payer: Medicare Other | Source: Ambulatory Visit | Attending: Orthopaedic Surgery | Admitting: Orthopaedic Surgery

## 2019-09-14 DIAGNOSIS — M25561 Pain in right knee: Secondary | ICD-10-CM

## 2019-09-14 DIAGNOSIS — G8929 Other chronic pain: Secondary | ICD-10-CM

## 2019-09-14 NOTE — Progress Notes (Signed)
Needs f/u appt 

## 2019-09-15 ENCOUNTER — Other Ambulatory Visit: Payer: Medicare Other

## 2019-09-20 ENCOUNTER — Ambulatory Visit: Payer: Medicare Other | Admitting: Orthopaedic Surgery

## 2019-10-05 ENCOUNTER — Other Ambulatory Visit: Payer: Self-pay

## 2019-10-05 ENCOUNTER — Encounter: Payer: Self-pay | Admitting: Orthopaedic Surgery

## 2019-10-05 ENCOUNTER — Ambulatory Visit (INDEPENDENT_AMBULATORY_CARE_PROVIDER_SITE_OTHER): Payer: Medicare Other | Admitting: Orthopaedic Surgery

## 2019-10-05 DIAGNOSIS — M1711 Unilateral primary osteoarthritis, right knee: Secondary | ICD-10-CM

## 2019-10-05 DIAGNOSIS — S83241A Other tear of medial meniscus, current injury, right knee, initial encounter: Secondary | ICD-10-CM

## 2019-10-05 NOTE — Progress Notes (Signed)
Office Visit Note   Patient: Arthur Garcia           Date of Birth: 09-12-1951           MRN: [id number] Visit Date: 10/05/2019              Requested by: Unk Pinto, [name],  [place] [id number] PCP: Unk Pinto, MD   Assessment & Plan: Visit Diagnoses:  1. Acute medial meniscus tear of right knee, initial encounter   2. Primary osteoarthritis of right knee     Plan: MRI of the right knee shows a complex tear of the mid body of the medial meniscus with subchondral insufficiency fracture and edema of the medial femoral condyle.  He does have tricompartmental chondromalacia as well.  These findings were reviewed with the patient in detail today.  Given the significant improvement in symptoms and lack of mechanical symptoms we have agreed to just monitor this for now and to increase activity as tolerated.  We will see him back as needed.  Follow-Up Instructions: Return if symptoms worsen or fail to improve.   Orders:  No orders of the defined types were placed in this encounter.  No orders of the defined types were placed in this encounter.     Procedures: No procedures performed   Clinical Data: No additional findings.   Subjective: Chief Complaint  Patient presents with  . Right Knee - Follow-up    [name] returns today for MRI review of the right knee.  Functionally speaking he is doing much better and he feels like he is pretty much asymptomatic at this point.  He has returned to the previous level of activity.   Review of Systems   Objective: Vital Signs: There were no vitals taken for this visit.  Physical Exam  Ortho Exam Right knee exam shows a trace joint effusion.  Mild medial joint line tenderness.  Collaterals and cruciates are stable.  Mild bony tenderness of the medial femoral condyle Specialty Comments:  No specialty comments available.  Imaging: No results found.   PMFS History: Patient  Active Problem List   Diagnosis Date Noted  . Acute medial meniscus tear of right knee 10/05/2019  . Primary osteoarthritis of right knee 10/05/2019  . Overweight (BMI 25.0-29.9) 05/12/2018  . Other abnormal glucose 01/28/2018  . Gastroesophageal reflux disease 10/28/2017  . CKD (chronic kidney disease) stage 2, GFR 60-89 ml/min 10/27/2017  . Essential hypertension 02/12/2016  . Vitamin D deficiency 02/12/2016  . Medication management 02/12/2016  . Right bundle branch block (RBBB) with left anterior fascicular block (LAFB) 04/11/2015  . ED (erectile dysfunction) 04/05/2014  . Smoker 10/31/2010  . Hyperlipidemia, mixed 10/30/2010   Past Medical History:  Diagnosis Date  . Abnormal liver function test   . Anxiety   . Colon polyp   . DDD (degenerative disc disease)   . ED (erectile dysfunction) 04/05/2014  . FHx: heart disease 01/28/2018  . GERD (gastroesophageal reflux disease)   . Hyperlipidemia   . Hypertension   . Insomnia   . Tobacco dependence     Family History  Problem Relation Age of Onset  . Thyroid disease Mother   . Hypertension Mother   . Osteoporosis Mother   . Glaucoma Mother   . Thyroid disease Father   . Hypertension Father   . Hyperlipidemia Father   . Heart disease Father        CHF  . Heart disease Paternal  Grandfather 88       Long standing "heart trouble"    Past Surgical History:  Procedure Laterality Date  . ADENOIDECTOMY     age 6  . APPENDECTOMY    . DENTAL SURGERY     Dental implants  . SPINE SURGERY  Aug 2016   Dr. Vertell Limber  . Uvuectomy    . VASECTOMY     Social History   Occupational History  . Not on file  Tobacco Use  . Smoking status: Current Every Day Smoker    Packs/day: 1.00    Years: 44.00    Pack years: 44.00    Types: Cigarettes  . Smokeless tobacco: Never Used  . Tobacco comment: On and off  Substance and Sexual Activity  . Alcohol use: Yes    Alcohol/week: 20.0 standard drinks    Types: 20 Cans of beer per week  .  Drug use: No  . Sexual activity: Not on file

## 2019-10-10 ENCOUNTER — Ambulatory Visit: Payer: BLUE CROSS/BLUE SHIELD | Attending: Internal Medicine

## 2019-10-10 DIAGNOSIS — Z20822 Contact with and (suspected) exposure to covid-19: Secondary | ICD-10-CM

## 2019-10-11 LAB — NOVEL CORONAVIRUS, NAA: SARS-CoV-2, NAA: NOT DETECTED

## 2019-10-18 DIAGNOSIS — Z6826 Body mass index (BMI) 26.0-26.9, adult: Secondary | ICD-10-CM | POA: Diagnosis not present

## 2019-10-18 DIAGNOSIS — M5126 Other intervertebral disc displacement, lumbar region: Secondary | ICD-10-CM | POA: Diagnosis not present

## 2019-10-18 DIAGNOSIS — I10 Essential (primary) hypertension: Secondary | ICD-10-CM | POA: Diagnosis not present

## 2019-10-18 DIAGNOSIS — M545 Low back pain: Secondary | ICD-10-CM | POA: Diagnosis not present

## 2019-10-21 ENCOUNTER — Encounter: Payer: Self-pay | Admitting: Internal Medicine

## 2019-11-13 ENCOUNTER — Encounter: Payer: Self-pay | Admitting: Internal Medicine

## 2019-11-13 NOTE — Progress Notes (Signed)
Annual  Screening/Preventative Visit  & Comprehensive Evaluation & Examination     This very nice 69 y.o. MWM presents for a Screening /Preventative Visit & comprehensive evaluation and management of multiple medical co-morbidities.  Patient has been followed for HTN, HLD, Prediabetes and Vitamin D Deficiency.     Patient has a long history of smoking over 40 pk years, I discussed lung cancer screening with him.Last year LD screening Lung CT scan was Negative. He was agreeable to undergo a screening low dose CT scan of the chest. We discussed smoking cessation techniques/options. I will refer him her for a LDCT lung scan &lung cancer screening program.     HTN predates since 2001. Patient's BP has been controlled at home.  Today's BP is at goal -  130/74. Patient denies any cardiac symptoms as chest pain, palpitations, shortness of breath, dizziness or ankle swelling.     Patient's hyperlipidemia is controlled with diet and Atorvastatin. Patient denies myalgias or other medication SE's. Last lipids were at goal:  Lab Results  Component Value Date   CHOL 114 08/09/2019   HDL 42 08/09/2019   LDLCALC 58 08/09/2019   TRIG 65 08/09/2019   CHOLHDL 2.7 08/09/2019       Patient has hx/o prediabetes (A1c 5.9%/2015 and A1c 5.6%/2017) and patient denies reactive hypoglycemic symptoms, visual blurring, diabetic polys or paresthesias. Last A1c was Normal & at goal:  Lab Results  Component Value Date   HGBA1C 5.5 05/03/2019        Finally, patient has history of Vitamin D Deficiency and last vitamin D was at goal:  Lab Results  Component Value Date   VD25OH 91 08/09/2019    Current Outpatient Medications on File Prior to Visit  Medication Sig  . aspirin EC 81 MG tablet Take 81 mg by mouth daily.  Marland Kitchen atorvastatin (LIPITOR) 20 MG tablet Take 1 tablet Daily for Cholesterol  . Cholecalciferol (VITAMIN D-3) 5000 UNITS TABS Take 10,000 Units by mouth.   . diclofenac (VOLTAREN) 75 MG EC  tablet Take 1 tablet 3 x /day with Food is needed for Pain & Inflammaton  . gabapentin (NEURONTIN) 300 MG capsule Take 300 mg by mouth 3 (three) times daily.   Marland Kitchen HYDROcodone-acetaminophen (NORCO/VICODIN) 5-325 MG tablet Take 1 tablet by mouth every 6 (six) hours as needed for moderate pain. Takes 2 talbets daily.  . traZODone (DESYREL) 50 MG tablet Take 1 to 2 tablets   1 hour before Bedtime as needed for Sleep  . valsartan-hydrochlorothiazide (DIOVAN-HCT) 160-25 MG tablet TAKE 1 TABLET DAILY FOR BLOOD PRESSURE  . varenicline (CHANTIX) 1 MG tablet Take 1 mg by mouth 2 (two) times daily.  . Zinc 50 MG TABS Take by mouth daily.   No current facility-administered medications on file prior to visit.   No Known Allergies   Past Medical History:  Diagnosis Date  . Abnormal liver function test   . Anxiety   . Colon polyp   . DDD (degenerative disc disease)   . ED (erectile dysfunction) 04/05/2014  . FHx: heart disease 01/28/2018  . GERD (gastroesophageal reflux disease)   . Hyperlipidemia   . Hypertension   . Insomnia   . Tobacco dependence    Health Maintenance  Topic Date Due  . PNA vac Low Risk Adult (2 of 2 - PPSV23) 10/06/2017  . Fecal DNA (Cologuard)  08/27/2020  . TETANUS/TDAP  07/11/2027  . INFLUENZA VACCINE  Completed  . Hepatitis C Screening  Completed  Immunization History  Administered Date(s) Administered  . Influenza Split 07/05/2014  . Influenza, High Dose Seasonal PF 08/21/2015, 08/09/2019  . Pneumococcal Conjugate-13 05/20/2016  . Pneumococcal Polysaccharide-23 10/06/2012  . Td 09/29/2006, 07/10/2017  . Zoster 08/21/2015   Last Colon -  07/23/2007 - Dr Fuller Plan - Polyp - recc 5 year f/u  Negative Cologard 08/27/2017 and f/u due Nov 2021  Past Surgical History:  Procedure Laterality Date  . ADENOIDECTOMY     age 77  . APPENDECTOMY    . DENTAL SURGERY     Dental implants  . SPINE SURGERY  Aug 2016   Dr. Vertell Limber  . Uvuectomy    . VASECTOMY     Family History    Problem Relation Age of Onset  . Thyroid disease Mother   . Hypertension Mother   . Osteoporosis Mother   . Glaucoma Mother   . Thyroid disease Father   . Hypertension Father   . Hyperlipidemia Father   . Heart disease Father        CHF  . Heart disease Paternal Grandfather 85       Long standing "heart trouble"   Social History   Socioeconomic History  . Marital status: Married    Spouse name: Not on file  . Number of children: 2  Occupational History  . Retired Nurse, children's  Tobacco Use  . Smoking status: Current Every Day Smoker    Packs/day: 1.00    Years: 44.00    Pack years: 44.00    Types: Cigarettes  . Smokeless tobacco: Never Used  . Tobacco comment: On and off  Substance and Sexual Activity  . Alcohol use: Yes    Alcohol/week: 20.0 standard drinks    Types: 20 Cans of beer per week  . Drug use: No  . Sexual activity: Not on file  Other Topics Concern  . Not on file  Social History Narrative   One grandchild.  Works on farm Engineer, structural.       ROS Constitutional: Denies fever, chills, weight loss/gain, headaches, insomnia,  night sweats or change in appetite. Does c/o fatigue. Eyes: Denies redness, blurred vision, diplopia, discharge, itchy or watery eyes.  ENT: Denies discharge, congestion, post nasal drip, epistaxis, sore throat, earache, hearing loss, dental pain, Tinnitus, Vertigo, Sinus pain or snoring.  Cardio: Denies chest pain, palpitations, irregular heartbeat, syncope, dyspnea, diaphoresis, orthopnea, PND, claudication or edema Respiratory: denies cough, dyspnea, DOE, pleurisy, hoarseness, laryngitis or wheezing.  Gastrointestinal: Denies dysphagia, heartburn, reflux, water brash, pain, cramps, nausea, vomiting, bloating, diarrhea, constipation, hematemesis, melena, hematochezia, jaundice or hemorrhoids Genitourinary: Denies dysuria, frequency, urgency, nocturia, hesitancy, discharge, hematuria or flank pain Musculoskeletal:  Denies arthralgia, myalgia, stiffness, Jt. Swelling, pain, limp or strain/sprain. Denies Falls. Skin: Denies puritis, rash, hives, warts, acne, eczema or change in skin lesion Neuro: No weakness, tremor, incoordination, spasms, paresthesia or pain Psychiatric: Denies confusion, memory loss or sensory loss. Denies Depression. Endocrine: Denies change in weight, skin, hair change, nocturia, and paresthesia, diabetic polys, visual blurring or hyper / hypo glycemic episodes.  Heme/Lymph: No excessive bleeding, bruising or enlarged lymph nodes.  Physical Exam  BP 130/74   Pulse 76   Temp (!) 96.3 F (35.7 C)   Resp 16   Ht 6\' 2"  (1.88 m)   Wt 208 lb 6.4 oz (94.5 kg)   BMI 26.76 kg/m   General Appearance: Well nourished and well groomed and in no apparent distress.  Eyes: PERRLA, EOMs, conjunctiva no swelling or  erythema, normal fundi and vessels. Sinuses: No frontal/maxillary tenderness ENT/Mouth: EACs patent / TMs  nl. Nares clear without erythema, swelling, mucoid exudates. Oral hygiene is good. No erythema, swelling, or exudate. Tongue normal, non-obstructing. Tonsils not swollen or erythematous. Hearing normal.  Neck: Supple, thyroid not palpable. No bruits, nodes or JVD. Respiratory: Respiratory effort normal.  BS equal and clear bilateral without rales, rhonci, wheezing or stridor. Cardio: Heart sounds are normal with regular rate and rhythm and no murmurs, rubs or gallops. Peripheral pulses are normal and equal bilaterally without edema. No aortic or femoral bruits. Chest: symmetric with normal excursions and percussion.  Abdomen: Soft, with Nl bowel sounds. Nontender, no guarding, rebound, hernias, masses, or organomegaly.  Lymphatics: Non tender without lymphadenopathy.  Musculoskeletal: Full ROM all peripheral extremities, joint stability, 5/5 strength, and normal gait. Skin: Warm and dry without rashes, lesions, cyanosis, clubbing or  ecchymosis.  Neuro: Cranial nerves intact,  reflexes equal bilaterally. Normal muscle tone, no cerebellar symptoms. Sensation intact.  Pysch: Alert and oriented X 3 with normal affect, insight and judgment appropriate.   Assessment and Plan  1. Essential hypertension  - EKG 12-Lead - [place], RETROPERITNL ABD,  LTD - Urinalysis, Routine w reflex microscopic - Microalbumin / creatinine urine ratio - CBC with Differential/Platelet - COMPLETE METABOLIC PANEL WITH GFR - Magnesium - TSH  2. Hyperlipidemia, mixed  - EKG 12-Lead - [place], RETROPERITNL ABD,  LTD - Lipid panel - TSH  3. Abnormal glucose  - EKG 12-Lead - [place], RETROPERITNL ABD,  LTD - Hemoglobin A1c  4. Vitamin D deficiency  - VITAMIN D 25 Hydroxy  5. Prediabetes  - EKG 12-Lead - [place], RETROPERITNL ABD,  LTD - Hemoglobin A1c - Insulin, random  6. Screening for colorectal cancer  - POC Hemoccult Bld/Stl (3-Cd Home Screen); Future  7. Benign localized prostatic hyperplasia with lower urinary tract symptoms (LUTS)  - PSA  8. Prostate cancer screening  - PSA  9. Encounter for screening for lung cancer  - CT CHEST LUNG CA SCREEN LOW DOSE W/O CM; Future  10. Encounter for screening for malignant neoplasm of respiratory organs  - CT CHEST LUNG CA SCREEN LOW DOSE W/O CM; Future  11. Screening for ischemic heart disease  - EKG 12-Lead  12. FHx: heart disease  - EKG 12-Lead - [place], RETROPERITNL ABD,  LTD  13. Smoker  - EKG 12-Lead - [place], RETROPERITNL ABD,  LTD - CT CHEST LUNG CA SCREEN LOW DOSE W/O CM; Future  14. Screening for AAA (abdominal aortic aneurysm)  - [place], RETROPERITNL ABD,  LTD  15. Medication management  - Urinalysis, Routine w reflex microscopic - Microalbumin / creatinine urine ratio - CBC with Differential/Platelet - COMPLETE METABOLIC PANEL WITH GFR - Magnesium - Lipid panel - TSH - Hemoglobin A1c - Insulin, random - VITAMIN D 25 Hydroxy (Vit-D Deficiency, Fractures)  16. Need for prophylactic vaccination against  Streptococcus pneumoniae (pneumococcus)  - Pneumococcal polysaccharide vaccine 23-valent greater than or equal to 2yo subcutaneous/IM         Patient was counseled in prudent diet, weight control to achieve/maintain BMI less than 25, BP monitoring, regular exercise and medications as discussed.  Discussed med effects and SE's. Routine screening labs and tests as requested with regular follow-up as recommended. [name], counseling, chart review and high complex critical decision making was performed   Kirtland Bouchard, MD

## 2019-11-13 NOTE — Patient Instructions (Signed)
- Link to sign up at [employer] site for the Covid-19 vaccine   [email or web address].[email or web address]   Or call [phone]  ++++++++++++++++++++++++++++++++++ Vit D  & Vit C 1,000 mg   are recommended to help protect  against the Covid-19 and other Corona viruses.    Also it's recommended  to take  Zinc 50 mg  to help  protect against the Covid-19   and best place to get  is also on [email or web address].com  and don't pay more than 6-8 cents /pill !  ================================ Coronavirus (COVID-19) Are you at risk?  Are you at risk for the Coronavirus (COVID-19)?  To be considered HIGH RISK for Coronavirus (COVID-19), you have to meet the following criteria:  . Traveled to [place], [place], [place], [place] or [place]; or in the [place] to [place], [place], [place]  . or [place]; and have fever, cough, and shortness of breath within the last 2 weeks of travel OR . Been in close contact with a person diagnosed with COVID-19 within the last 2 weeks and have  . fever, cough,and shortness of breath .  . IF YOU DO NOT MEET THESE CRITERIA, YOU ARE CONSIDERED LOW RISK FOR COVID-19.  What to do if you are HIGH RISK for COVID-19?  Marland Kitchen If you are having a medical emergency, call 911. . Seek medical care right away. Before you go to a doctor's office, urgent care or emergency department, .  call ahead and tell them about your recent travel, contact with someone diagnosed with COVID-19  .  and your symptoms.  . You should receive instructions from your physician's office regarding next steps of care.  . When you arrive at healthcare provider, tell the healthcare staff immediately you have returned from  . visiting [place], [place], [place], [place] or [place]; or traveled in the [place] to [place], [place],  . [place] or [place] in the last two  weeks or you have been in close contact with a person diagnosed with  . COVID-19 in the last 2 weeks.   . Tell the health care staff about your symptoms: fever, cough and shortness of breath. . After you have been seen by a medical provider, you will be either: o Tested for (COVID-19) and discharged home on quarantine except to seek medical care if  o symptoms worsen, and asked to  - Stay home and avoid contact with others until you get your results (4-5 days)  - Avoid travel on public transportation if possible (such as bus, train, or airplane) or o Sent to the Emergency Department by EMS for evaluation, COVID-19 testing  and  o possible admission depending on your condition and test results.  What to do if you are LOW RISK for COVID-19?  Reduce your risk of any infection by using the same precautions used for avoiding the common cold or flu:  Marland Kitchen Wash your hands often with soap and warm water for at least 20 seconds.  If soap and water are not readily available,  . use an alcohol-based hand sanitizer with at least 60% alcohol.  . If coughing or sneezing, cover your mouth and nose by coughing or sneezing into the elbow areas of your shirt or coat, .  into a tissue or into your sleeve (not your hands). . Avoid shaking hands with others and consider head nods or verbal greetings only. . Avoid touching your eyes, nose, or mouth with unwashed hands.  Marland Kitchen  Avoid close contact with people who are sick. . Avoid places or events with large numbers of people in one location, like concerts or sporting events. . Carefully consider travel plans you have or are making. . If you are planning any travel outside or inside the [place], visit the CDC's Travelers' Health webpage for the latest health notices. . If you have some symptoms but not all symptoms, continue to monitor at home and seek medical attention  . if your symptoms worsen. . If you are having a medical emergency, call  911. >>>>>>>>>>>>>>>>>>>>>>>>>>>>>>>>>>>>>>>>>>>>>>>>>>>>>>> We Do NOT Approve of  [facility] [place] Soliciting Our Patients  To Do Home Visits  & We Do NOT Approve of [employer] > > > > > > > > > > > > > > > > > > > > > > > > > > > > > > > > > > >  > > > >   Preventive Care for Adults  A healthy lifestyle and preventive care can promote health and wellness. Preventive health guidelines for men include the following key practices:  A routine yearly physical is a good way to check with your health care provider about your health and preventative screening. It is a chance to share any concerns and updates on your health and to receive a thorough exam.  Visit your dentist for a routine exam and preventative care every 6 months. Brush your teeth twice a day and floss once a day. Good oral hygiene prevents tooth decay and gum disease.  The frequency of eye exams is based on your age, health, family medical history, use of contact lenses, and other factors. Follow your health care provider's recommendations for frequency of eye exams.  Eat a healthy diet. Foods such as vegetables, fruits, whole grains, low-fat dairy products, and lean protein foods contain the nutrients you need without too many calories. Decrease your intake of foods high in solid fats, added sugars, and salt. Eat the right amount of calories for you. Get information about a proper diet from your health care provider, if necessary.  Regular physical exercise is one of the most important things you can do for your health. Most adults should get at least 150 minutes of moderate-intensity exercise (any activity that increases your heart rate and causes you to sweat) each week. In addition, most adults need muscle-strengthening exercises on 2 or more days a week.  Maintain a healthy weight. The body mass index (BMI) is a screening tool to identify possible weight problems. It provides an estimate of body fat  based on height and weight. Your health care provider can find your BMI and can help you achieve or maintain a healthy weight. For adults 20 years and older:  A BMI below 18.5 is considered underweight.  A BMI of 18.5 to 24.9 is normal.  A BMI of 25 to 29.9 is considered overweight.  A BMI of 30 and above is considered obese.  Maintain normal blood lipids and cholesterol levels by exercising and minimizing your intake of saturated fat. Eat a balanced diet with plenty of fruit and vegetables. Blood tests for lipids and cholesterol should begin at age 36 and be repeated every 5 years. If your lipid or cholesterol levels are high, you are over 50, or you are at high risk for heart disease, you may need your cholesterol levels checked more frequently. Ongoing high lipid and cholesterol levels should be treated with medicines if diet and exercise are not  working.  If you smoke, find out from your health care provider how to quit. If you do not use tobacco, do not start.  Lung cancer screening is recommended for adults aged 78-80 years who are at high risk for developing lung cancer because of a history of smoking. A yearly low-dose CT scan of the lungs is recommended for people who have at least a 30-pack-year history of smoking and are a current smoker or have quit within the past 15 years. A pack year of smoking is smoking an average of 1 pack of cigarettes a day for 1 year (for example: 1 pack a day for 30 years or 2 packs a day for 15 years). Yearly screening should continue until the smoker has stopped smoking for at least 15 years. Yearly screening should be stopped for people who develop a health problem that would prevent them from having lung cancer treatment.  If you choose to drink alcohol, do not have more than 2 drinks per day. One drink is considered to be 12 ounces (355 mL) of beer, 5 ounces (148 mL) of wine, or 1.5 ounces (44 mL) of liquor.  Avoid use of street drugs. Do not share  needles with anyone. Ask for help if you need support or instructions about stopping the use of drugs.  High blood pressure causes heart disease and increases the risk of stroke. Your blood pressure should be checked at least every 1-2 years. Ongoing high blood pressure should be treated with medicines, if weight loss and exercise are not effective.  If you are 56-57 years old, ask your health care provider if you should take aspirin to prevent heart disease.  Diabetes screening involves taking a blood sample to check your fasting blood sugar level. Testing should be considered at a younger age or be carried out more frequently if you are overweight and have at least 1 risk factor for diabetes.  Colorectal cancer can be detected and often prevented. Most routine colorectal cancer screening begins at the age of 35 and continues through age 60. However, your health care provider may recommend screening at an earlier age if you have risk factors for colon cancer. On a yearly basis, your health care provider may provide home test kits to check for hidden blood in the stool. Use of a small camera at the end of a tube to directly examine the colon (sigmoidoscopy or colonoscopy) can detect the earliest forms of colorectal cancer. Talk to your health care provider about this at age 8, when routine screening begins. Direct exam of the colon should be repeated every 5-10 years through age 45, unless early forms of precancerous polyps or small growths are found.  Hepatitis C blood testing is recommended for all people born from [birth year] through 1966 and any individual with known risks for hepatitis C.  Screening for abdominal aortic aneurysm (AAA)  by ultrasound is recommended for people who have history of high blood pressure or who are current or former smokers.  Healthy men should  receive prostate-specific antigen (PSA) blood tests as part of routine cancer screening. Talk with your health care provider about  prostate cancer screening.  Testicular cancer screening is  recommended for adult males. Screening includes self-exam, a health care provider exam, and other screening tests. Consult with your health care provider about any symptoms you have or any concerns you have about testicular cancer.  Use sunscreen. Apply sunscreen liberally and repeatedly throughout the day. You should seek shade when your  shadow is shorter than you. Protect yourself by wearing long sleeves, pants, a wide-brimmed hat, and sunglasses year round, whenever you are outdoors.  Once a month, do a whole-body skin exam, using a mirror to look at the skin on your back. Tell your health care provider about new moles, moles that have irregular borders, moles that are larger than a pencil eraser, or moles that have changed in shape or color.  Stay current with required vaccines (immunizations).  Influenza vaccine. All adults should be immunized every year.  Tetanus, diphtheria, and acellular pertussis (Td, Tdap) vaccine. An adult who has not previously received Tdap or who does not know his vaccine status should receive 1 dose of Tdap. This initial dose should be followed by tetanus and diphtheria toxoids (Td) booster doses every 10 years. Adults with an unknown or incomplete history of completing a 3-dose immunization series with Td-containing vaccines should begin or complete a primary immunization series including a Tdap dose. Adults should receive a Td booster every 10 years.  Zoster vaccine. One dose is recommended for adults aged 70 years or older unless certain conditions are present.    PREVNAR - Pneumococcal 13-valent conjugate (PCV13) vaccine. When indicated, a person who is uncertain of his immunization history and has no record of immunization should receive the PCV13 vaccine. An adult aged 63 years or older who has certain medical conditions and has not been previously immunized should receive 1 dose of PCV13 vaccine. This  PCV13 should be followed with a dose of pneumococcal polysaccharide (PPSV23) vaccine. The PPSV23 vaccine dose should be obtained 1 or more year(s)after the dose of PCV13 vaccine. An adult aged 41 years or older who has certain medical conditions and previously received 1 or more doses of PPSV23 vaccine should receive 1 dose of PCV13. The PCV13 vaccine dose should be obtained 1 or more years after the last PPSV23 vaccine dose.    PNEUMOVAX - Pneumococcal polysaccharide (PPSV23) vaccine. When PCV13 is also indicated, PCV13 should be obtained first. All adults aged 33 years and older should be immunized. An adult younger than age 27 years who has certain medical conditions should be immunized. Any person who resides in a nursing home or long-term care facility should be immunized. An adult smoker should be immunized. People with an immunocompromised condition and certain other conditions should receive both PCV13 and PPSV23 vaccines. People with human immunodeficiency virus (HIV) infection should be immunized as soon as possible after diagnosis. Immunization during chemotherapy or radiation therapy should be avoided. Routine use of PPSV23 vaccine is not recommended for American Indians, [place] Natives, or people younger than 65 years unless there are medical conditions that require PPSV23 vaccine. When indicated, people who have unknown immunization and have no record of immunization should receive PPSV23 vaccine. One-time revaccination 5 years after the first dose of PPSV23 is recommended for people aged 19-64 years who have chronic kidney failure, nephrotic syndrome, asplenia, or immunocompromised conditions. People who received 1-2 doses of PPSV23 before age 72 years should receive another dose of PPSV23 vaccine at age 21 years or later if at least 5 years have passed since the previous dose. Doses of PPSV23 are not needed for people immunized with PPSV23 at or after age 6 years.    Hepatitis A vaccine.  Adults who wish to be protected from this disease, have certain high-risk conditions, work with hepatitis A-infected animals, work in hepatitis A research labs, or travel to or work in countries with a high rate of  hepatitis A should be immunized. Adults who were previously unvaccinated and who anticipate close contact with an international adoptee during the first 60 days after arrival in the [place] from a country with a high rate of hepatitis A should be immunized.    Hepatitis B vaccine. Adults should be immunized if they wish to be protected from this disease, have certain high-risk conditions, may be exposed to blood or other infectious body fluids, are household contacts or sex partners of hepatitis B positive people, are clients or workers in certain care facilities, or travel to or work in countries with a high rate of hepatitis B.   Preventive Service / Frequency   Ages 30 and over  Blood pressure check.  Lipid and cholesterol check.  Lung cancer screening. / Every year if you are aged 72-80 years and have a 30-pack-year history of smoking and currently smoke or have quit within the past 15 years. Yearly screening is stopped once you have quit smoking for at least 15 years or develop a health problem that would prevent you from having lung cancer treatment.  Fecal occult blood test (FOBT) of stool. You may not have to do this test if you get a colonoscopy every 10 years.  Flexible sigmoidoscopy** or colonoscopy.** / Every 5 years for a flexible sigmoidoscopy or every 10 years for a colonoscopy beginning at age 33 and continuing until age 17.  Hepatitis C blood test.** / For all people born from [birth year] through 1966 and any individual with known risks for hepatitis C.  Abdominal aortic aneurysm (AAA) screening./ Screening current or former smokers or have Hypertension.  Skin self-exam. / Monthly.  Influenza vaccine. / Every year.  Tetanus, diphtheria, and acellular pertussis  (Tdap/Td) vaccine.** / 1 dose of Td every 10 years.   Zoster vaccine.** / 1 dose for adults aged 69 years or older.         Pneumococcal 13-valent conjugate (PCV13) vaccine.    Pneumococcal polysaccharide (PPSV23) vaccine.     Hepatitis A vaccine.** / Consult your health care provider.  Hepatitis B vaccine.** / Consult your health care provider. Screening for abdominal aortic aneurysm (AAA)  by ultrasound is recommended for people who have history of high blood pressure or who are current or former smokers. ++++++++++ Recommend Adult Low Dose Aspirin or  coated  Aspirin 81 mg daily  To reduce risk of Colon Cancer 40 %,  Skin Cancer 26 % ,  Malignant Melanoma 46%  and  Pancreatic cancer 60% ++++++++++++++++++++++ Vitamin D goal  is between 70-100.  Please make sure that you are taking your Vitamin D as directed.  It is very important as a natural anti-inflammatory  helping hair, skin, and nails, as well as reducing stroke and heart attack risk.  It helps your bones and helps with mood. It also decreases numerous cancer risks so please take it as directed.  Low Vit D is associated with a 200-300% higher risk for CANCER  and 200-300% higher risk for HEART   ATTACK  &  STROKE.   .....................................Marland Kitchen It is also associated with higher death rate at younger ages,  autoimmune diseases like Rheumatoid arthritis, Lupus, Multiple Sclerosis.    Also many other serious conditions, like depression, Alzheimer's Dementia, infertility, muscle aches, fatigue, fibromyalgia - just to name a few. ++++++++++++++++++++++ Recommend the book "The END of DIETING" by Dr Excell Seltzer  & the book "The END of DIABETES " by Dr Excell Seltzer At [facility].[email or web address] - get book &  Audio CD's    Being diabetic has a  300% increased risk for heart attack, stroke, cancer, and alzheimer- type vascular dementia. It is very important that you work harder with diet by avoiding all foods that are white. Avoid  white rice (brown & wild rice is OK), white potatoes (sweetpotatoes in moderation is OK), White bread or wheat bread or anything made out of white flour like bagels, donuts, rolls, buns, biscuits, cakes, pastries, cookies, pizza crust, and pasta (made from white flour & egg whites) - vegetarian pasta or spinach or wheat pasta is OK. Multigrain breads like Arnold's or Pepper[employer], or multigrain sandwich thins or flatbreads.  Diet, exercise and weight loss can reverse and cure diabetes in the early stages.  Diet, exercise and weight loss is very important in the control and prevention of complications of diabetes which affects every system in your body, ie. Brain - dementia/stroke, eyes - glaucoma/blindness, heart - heart attack/heart failure, kidneys - dialysis, stomach - gastric paralysis, intestines - malabsorption, nerves - severe painful neuritis, circulation - gangrene & loss of a leg(s), and finally cancer and Alzheimers.    I recommend avoid fried & greasy foods,  sweets/candy, white rice (brown or wild rice or Quinoa is OK), white potatoes (sweet potatoes are OK) - anything made from white flour - bagels, doughnuts, rolls, buns, biscuits,white and wheat breads, pizza crust and traditional pasta made of white flour & egg white(vegetarian pasta or spinach or wheat pasta is OK).  Multi-grain bread is OK - like multi-grain flat bread or sandwich thins. Avoid alcohol in excess. Exercise is also important.    Eat all the vegetables you want - avoid meat, especially red meat and dairy - especially cheese.  Cheese is the most concentrated form of trans-fats which is the worst thing to clog up our arteries. Veggie cheese is OK which can be found in the fresh produce section at [employer] or [employer] or [employer]  ++++++++++++++++++++++ DASH Eating Plan  DASH stands for "Dietary Approaches to Stop Hypertension."   The DASH eating plan is a healthy eating plan that has been shown to reduce high  blood pressure (hypertension). Additional health benefits may include reducing the risk of type 2 diabetes mellitus, heart disease, and stroke. The DASH eating plan may also help with weight loss. WHAT DO I NEED TO KNOW ABOUT THE DASH EATING PLAN? For the DASH eating plan, you will follow these general guidelines:  Choose foods with a percent daily value for sodium of less than 5% (as listed on the food label).  Use salt-free seasonings or herbs instead of table salt or sea salt.  Check with your health care provider or pharmacist before using salt substitutes.  Eat lower-sodium products, often labeled as "lower sodium" or "no salt added."  Eat fresh foods.  Eat more vegetables, fruits, and low-fat dairy products.  Choose whole grains. Look for the word "whole" as the first word in the ingredient list.  Choose fish   Limit sweets, desserts, sugars, and sugary drinks.  Choose heart-healthy fats.  Eat veggie cheese   Eat more home-cooked food and less restaurant, buffet, and fast food.  Limit fried foods.  Cook foods using methods other than frying.  Limit canned vegetables. If you do use them, rinse them well to decrease the sodium.  When eating at a restaurant, ask that your food be prepared with less salt, or no salt if possible.  WHAT FOODS CAN I EAT? Read Dr Fara Olden Fuhrman's books on The End of Dieting & The End of Diabetes  Grains Whole grain or whole wheat bread. Brown rice. Whole grain or whole wheat pasta. Quinoa, bulgur, and whole grain cereals. Low-sodium cereals. Corn or whole wheat flour tortillas. Whole grain cornbread. Whole grain crackers. Low-sodium crackers.  Vegetables Fresh or frozen vegetables (raw, steamed, roasted, or grilled). Low-sodium or reduced-sodium tomato and vegetable juices. Low-sodium or reduced-sodium tomato sauce and paste. Low-sodium or reduced-sodium canned vegetables.   Fruits All fresh, canned (in natural juice), or  frozen fruits.  Protein Products  All fish and seafood.  Dried beans, peas, or lentils. Unsalted nuts and seeds. Unsalted canned beans.  Dairy Low-fat dairy products, such as skim or 1% milk, 2% or reduced-fat cheeses, low-fat ricotta or cottage cheese, or plain low-fat yogurt. Low-sodium or reduced-sodium cheeses.  Fats and Oils Tub margarines without trans fats. Light or reduced-fat mayonnaise and salad dressings (reduced sodium). Avocado. Safflower, olive, or canola oils. Natural peanut or almond butter.  Other Unsalted popcorn and pretzels. The items listed above may not be a complete list of recommended foods or beverages. Contact your dietitian for more options.  ++++++++++++++++++++  WHAT FOODS ARE NOT RECOMMENDED? Grains/ White flour or wheat flour White bread. White pasta. White rice. Refined cornbread. Bagels and croissants. Crackers that contain trans fat.  Vegetables  Creamed or fried vegetables. Vegetables in a . Regular canned vegetables. Regular canned tomato sauce and paste. Regular tomato and vegetable juices.  Fruits Dried fruits. Canned fruit in light or heavy syrup. Fruit juice.  Meat and Other Protein Products Meat in general - RED meat & White meat.  Fatty cuts of meat. Ribs, chicken wings, all processed meats as bacon, sausage, bologna, salami, fatback, hot dogs, bratwurst and packaged luncheon meats.  Dairy Whole or 2% milk, cream, half-and-half, and cream cheese. Whole-fat or sweetened yogurt. Full-fat cheeses or blue cheese. Non-dairy creamers and whipped toppings. Processed cheese, cheese spreads, or cheese curds.  Condiments Onion and garlic salt, seasoned salt, table salt, and sea salt. Canned and packaged gravies. Worcestershire sauce. Tartar sauce. Barbecue sauce. Teriyaki sauce. Soy sauce, including reduced sodium. Steak sauce. Fish sauce. Oyster sauce. Cocktail sauce. Horseradish. Ketchup and mustard. Meat flavorings and tenderizers. Bouillon cubes.  Hot sauce. Tabasco sauce. Marinades. Taco seasonings. Relishes.  Fats and Oils Butter, stick margarine, lard, shortening and bacon fat. Coconut, palm kernel, or palm oils. Regular salad dressings.  Pickles and olives. Salted popcorn and pretzels.  The items listed above may not be a complete list of foods and beverages to avoid.

## 2019-11-14 ENCOUNTER — Other Ambulatory Visit: Payer: Self-pay

## 2019-11-14 ENCOUNTER — Ambulatory Visit (INDEPENDENT_AMBULATORY_CARE_PROVIDER_SITE_OTHER): Payer: Medicare Other | Admitting: Internal Medicine

## 2019-11-14 VITALS — BP 130/74 | HR 76 | Temp 96.3°F | Resp 16 | Ht 74.0 in | Wt 208.4 lb

## 2019-11-14 DIAGNOSIS — N401 Enlarged prostate with lower urinary tract symptoms: Secondary | ICD-10-CM

## 2019-11-14 DIAGNOSIS — R7303 Prediabetes: Secondary | ICD-10-CM

## 2019-11-14 DIAGNOSIS — Z136 Encounter for screening for cardiovascular disorders: Secondary | ICD-10-CM | POA: Diagnosis not present

## 2019-11-14 DIAGNOSIS — Z8249 Family history of ischemic heart disease and other diseases of the circulatory system: Secondary | ICD-10-CM | POA: Diagnosis not present

## 2019-11-14 DIAGNOSIS — Z79899 Other long term (current) drug therapy: Secondary | ICD-10-CM | POA: Diagnosis not present

## 2019-11-14 DIAGNOSIS — Z23 Encounter for immunization: Secondary | ICD-10-CM | POA: Diagnosis not present

## 2019-11-14 DIAGNOSIS — I10 Essential (primary) hypertension: Secondary | ICD-10-CM | POA: Diagnosis not present

## 2019-11-14 DIAGNOSIS — Z125 Encounter for screening for malignant neoplasm of prostate: Secondary | ICD-10-CM | POA: Diagnosis not present

## 2019-11-14 DIAGNOSIS — E559 Vitamin D deficiency, unspecified: Secondary | ICD-10-CM

## 2019-11-14 DIAGNOSIS — F17200 Nicotine dependence, unspecified, uncomplicated: Secondary | ICD-10-CM

## 2019-11-14 DIAGNOSIS — R7309 Other abnormal glucose: Secondary | ICD-10-CM | POA: Diagnosis not present

## 2019-11-14 DIAGNOSIS — E782 Mixed hyperlipidemia: Secondary | ICD-10-CM

## 2019-11-14 DIAGNOSIS — Z122 Encounter for screening for malignant neoplasm of respiratory organs: Secondary | ICD-10-CM

## 2019-11-14 DIAGNOSIS — Z1211 Encounter for screening for malignant neoplasm of colon: Secondary | ICD-10-CM

## 2019-11-15 LAB — VITAMIN D 25 HYDROXY (VIT D DEFICIENCY, FRACTURES): Vit D, 25-Hydroxy: 84 ng/mL (ref 30–100)

## 2019-11-15 LAB — URINALYSIS, ROUTINE W REFLEX MICROSCOPIC
Bilirubin Urine: NEGATIVE
Glucose, UA: NEGATIVE
Hgb urine dipstick: NEGATIVE
Ketones, ur: NEGATIVE
Leukocytes,Ua: NEGATIVE
Nitrite: NEGATIVE
Protein, ur: NEGATIVE
Specific Gravity, Urine: 1.009 (ref 1.001–1.03)
pH: 6.5 (ref 5.0–8.0)

## 2019-11-15 LAB — COMPLETE METABOLIC PANEL WITH GFR
AG Ratio: 1.7 (calc) (ref 1.0–2.5)
ALT: 23 U/L (ref 9–46)
AST: 19 U/L (ref 10–35)
Albumin: 4.5 g/dL (ref 3.6–5.1)
Alkaline phosphatase (APISO): 70 U/L (ref 35–144)
BUN: 19 mg/dL (ref 7–25)
CO2: 28 mmol/L (ref 20–32)
Calcium: 9.9 mg/dL (ref 8.6–10.3)
Chloride: 99 mmol/L (ref 98–110)
Creat: 1.1 mg/dL (ref 0.70–1.25)
GFR, Est African American: 79 mL/min/{1.73_m2} (ref >=60)
GFR, Est Non African American: 68 mL/min/{1.73_m2} (ref >=60)
Globulin: 2.6 g/dL (calc) (ref 1.9–3.7)
Glucose, Bld: 105 mg/dL — ABNORMAL HIGH (ref 65–99)
Potassium: 4.8 mmol/L (ref 3.5–5.3)
Sodium: 136 mmol/L (ref 135–146)
Total Bilirubin: 0.5 mg/dL (ref 0.2–1.2)
Total Protein: 7.1 g/dL (ref 6.1–8.1)

## 2019-11-15 LAB — CBC WITH DIFFERENTIAL/PLATELET
Absolute Monocytes: 400 cells/uL (ref 200–950)
Basophils Absolute: 60 cells/uL (ref 0–200)
Basophils Relative: 1.2 %
Eosinophils Absolute: 220 cells/uL (ref 15–500)
Eosinophils Relative: 4.4 %
HCT: 41.3 % (ref 38.5–50.0)
Hemoglobin: 14 g/dL (ref 13.2–17.1)
Lymphs Abs: 1205 cells/uL (ref 850–3900)
MCH: 30.5 pg (ref 27.0–33.0)
MCHC: 33.9 g/dL (ref 32.0–36.0)
MCV: 90 fL (ref 80.0–100.0)
MPV: 10.4 fL (ref 7.5–12.5)
Monocytes Relative: 8 %
Neutro Abs: 3115 cells/uL (ref 1500–7800)
Neutrophils Relative %: 62.3 %
Platelets: 221 10*3/uL (ref 140–400)
RBC: 4.59 10*6/uL (ref 4.20–5.80)
RDW: 11.8 % (ref 11.0–15.0)
Total Lymphocyte: 24.1 %
WBC: 5 10*3/uL (ref 3.8–10.8)

## 2019-11-15 LAB — LIPID PANEL
Cholesterol: 114 mg/dL (ref <200)
HDL: 47 mg/dL (ref >=40)
LDL Cholesterol (Calc): 52 mg/dL (calc)
Non-HDL Cholesterol (Calc): 67 mg/dL (calc) (ref <130)
Total CHOL/HDL Ratio: 2.4 (calc) (ref <5.0)
Triglycerides: 70 mg/dL (ref <150)

## 2019-11-15 LAB — MICROALBUMIN / CREATININE URINE RATIO
Creatinine, Urine: 51 mg/dL (ref 20–320)
Microalb Creat Ratio: 6 mcg/mg creat (ref <30)
Microalb, Ur: 0.3 mg/dL

## 2019-11-15 LAB — MAGNESIUM: Magnesium: 1.8 mg/dL (ref 1.5–2.5)

## 2019-11-15 LAB — INSULIN, RANDOM: Insulin: 5.2 u[IU]/mL

## 2019-11-15 LAB — HEMOGLOBIN A1C
Hgb A1c MFr Bld: 5.7 % of total Hgb — ABNORMAL HIGH (ref <5.7)
Mean Plasma Glucose: 117 (calc)
eAG (mmol/L): 6.5 (calc)

## 2019-11-15 LAB — PSA: PSA: 2.8 ng/mL (ref <=4.0)

## 2019-11-15 LAB — TSH: TSH: 1.01 mIU/L (ref 0.40–4.50)

## 2019-11-17 ENCOUNTER — Other Ambulatory Visit: Payer: Self-pay | Admitting: Adult Health

## 2019-11-17 DIAGNOSIS — I10 Essential (primary) hypertension: Secondary | ICD-10-CM

## 2019-12-27 DIAGNOSIS — M5126 Other intervertebral disc displacement, lumbar region: Secondary | ICD-10-CM | POA: Diagnosis not present

## 2019-12-27 DIAGNOSIS — Z6827 Body mass index (BMI) 27.0-27.9, adult: Secondary | ICD-10-CM | POA: Diagnosis not present

## 2019-12-27 DIAGNOSIS — I10 Essential (primary) hypertension: Secondary | ICD-10-CM | POA: Diagnosis not present

## 2019-12-27 DIAGNOSIS — M7138 Other bursal cyst, other site: Secondary | ICD-10-CM | POA: Diagnosis not present

## 2020-01-26 ENCOUNTER — Other Ambulatory Visit: Payer: Self-pay | Admitting: Internal Medicine

## 2020-01-26 ENCOUNTER — Ambulatory Visit: Payer: Medicare Other | Attending: Internal Medicine

## 2020-01-26 DIAGNOSIS — Z23 Encounter for immunization: Secondary | ICD-10-CM

## 2020-01-26 DIAGNOSIS — F17200 Nicotine dependence, unspecified, uncomplicated: Secondary | ICD-10-CM

## 2020-01-26 MED ORDER — VARENICLINE TARTRATE 1 MG PO TABS: ORAL_TABLET | ORAL | 1 refills | Status: DC

## 2020-01-26 NOTE — Progress Notes (Signed)
   Covid-19 Vaccination Clinic  Name:  Arthur Garcia    MRN: [id number] DOB: 1951/07/13  01/26/2020  Mr. Frisella was observed post Covid-19 immunization for 15 minutes without incident. He was provided with Vaccine Information Sheet and instruction to access the V-Safe system.   Mr. Yellowhair was instructed to call 911 with any severe reactions post vaccine: Marland Kitchen Difficulty breathing  . Swelling of face and throat  . A fast heartbeat  . A bad rash all over body  . Dizziness and weakness   Immunizations Administered    Name Date Dose VIS Date Route   Pfizer COVID-19 Vaccine 01/26/2020 11:17 AM 0.3 mL 11/23/2018 Intramuscular   Manufacturer: [place]   Lot: [id number]   [place]: [id number]

## 2020-01-30 ENCOUNTER — Other Ambulatory Visit: Payer: Self-pay | Admitting: Internal Medicine

## 2020-01-31 ENCOUNTER — Telehealth: Payer: Self-pay | Admitting: *Deleted

## 2020-01-31 NOTE — Telephone Encounter (Signed)
Faxed Chantix approval, from [place], from 11/01/2019 to 10/03/021 to [facility] at [facility].

## 2020-02-13 ENCOUNTER — Ambulatory Visit
Admission: RE | Admit: 2020-02-13 | Discharge: 2020-02-13 | Disposition: A | Discharge status: Home / Self Care | Payer: Medicare Other | Source: Ambulatory Visit | Attending: Internal Medicine | Admitting: Internal Medicine

## 2020-02-13 ENCOUNTER — Encounter: Payer: Self-pay | Admitting: Internal Medicine

## 2020-02-13 DIAGNOSIS — Z122 Encounter for screening for malignant neoplasm of respiratory organs: Secondary | ICD-10-CM

## 2020-02-13 DIAGNOSIS — F17200 Nicotine dependence, unspecified, uncomplicated: Secondary | ICD-10-CM

## 2020-02-13 DIAGNOSIS — F17210 Nicotine dependence, cigarettes, uncomplicated: Secondary | ICD-10-CM | POA: Diagnosis not present

## 2020-02-13 DIAGNOSIS — I700 Atherosclerosis of aorta: Secondary | ICD-10-CM | POA: Insufficient documentation

## 2020-02-15 ENCOUNTER — Ambulatory Visit: Payer: Medicare Other | Admitting: Adult Health

## 2020-02-15 NOTE — Progress Notes (Signed)
MEDICARE ANNUAL WELLNESS VISIT AND FOLLOW UP Assessment:   Diagnoses and all orders for this visit:  Medicare annual wellness visit, subsequent Due annually  Plan to order cologuard at CPE - due 07/2020 scheduled for second covid 19 vaccine   Atherosclerosis of aorta Per CT 01/2020 Control blood pressure, cholesterol, glucose, increase exercise.   Essential hypertension Continue valsartan/hctz Monitor blood pressure at home regularly; call if consistently over 130/80 Continue DASH diet.   Reminder to go to the ER if any CP, SOB, nausea, dizziness, severe HA, changes vision/speech, left arm numbness and tingling and jaw pain.  Other male erectile dysfunction Treated by Cialis PRN; no concerns or complications Established with urology; follow up PRN  Hyperlipidemia, unspecified hyperlipidemia type Well controlled; continue statin medication Continue low cholesterol diet and exercise.  -     Lipid panel -     TSH  TOBACCO ABUSE Discussed risks associated with tobacco use and advised to reduce or quit Patient is ready to do so,chantix refilled - information provided Will follow up at the next visit CT screening neg 01/2020; repeat annually   Right bundle branch block (RBBB) with left anterior fascicular block (LAFB) Evaluated by cardiology; no further interventions recommended unless progresses and symptomatic Continue annual EKG and as indicated  Other abnormal glucose A1Cs recently well controlled; previously prediabetic Discussed disease and risks Discussed diet/exercise, weight management  -     CMP WITH GFR  Vitamin D deficiency At goal; continue supplementation Defer vitamin D level   Medication management -     CBC with Differential/Platelet -     CMP/GFR -     Magnesium   CKD (chronic kidney disease) stage 2, GFR 60-89 ml/min Increase fluids, avoid NSAIDS, monitor sugars, will monitor  GERD Well managed off of medications at this time Discussed  diet, avoiding triggers and other lifestyle changes    Over 30 minutes of exam, counseling, chart review, and critical decision making was performed  Future Appointments  Date Time Provider [place]  02/20/2020 11:00 AM [place]  05/24/2020 10:30 AM Unk Pinto, MD [facility] None  11/26/2020 11:00 AM Unk Pinto, MD [facility] None     Plan:   During the course of the visit the patient was educated and counseled about appropriate screening and preventive services including:    Pneumococcal vaccine   Influenza vaccine  Prevnar 13  Td vaccine  Screening electrocardiogram  Colorectal cancer screening  Diabetes screening  Glaucoma screening  Nutrition counseling    Subjective:  Arthur Garcia is a 69 y.o. male who presents for Medicare Annual Wellness Visit and 3 month follow up for HTN, hyperlipidemia, glucose management (hx of prediabetes), and vitamin D Def.   He has hx of DDD s/p lumbar discectomy x 2 by Dr. Vertell Limber in 2016 - he continues to experience pain and discomfort related to this, currently prescribed norco and neurontin and managed by pain management via Dr. Melven Sartorius office.   Recently saw Dr. Erlinda Hong for acute R knee pain, dx with meniscal tear and arthritis but improved without intervention and monitoring only. Patient reports no pain in knee at this time.   he currently continues to smoke <1 pack a day; discussed risks associated with smoking, patient is ready to quit - he has been on chantix with benefit (was down to 0.5 pack while taking), requesting refill today.  He has 30+ pack/yr smoking history and recently underwent low dose CT screening in 01/2020  which was negative other than aortic atherosclerosis, did shows some cholelithiases.   BMI is Body mass index is 27.09 kg/m., he has been working on diet and exercise, no intentional exercise since dog passed away, working on [employer] upper home most days.  Wt  Readings from Last 3 Encounters:  02/16/20 211 lb (95.7 kg)  11/14/19 208 lb 6.4 oz (94.5 kg)  08/15/19 210 lb (95.3 kg)   He has not been checking BPs at home regularly, today their BP is BP: 124/74 He is taking valsartan/hctz combo.  He does workout. He denies chest pain, shortness of breath, dizziness.   He is on cholesterol medication (atorvastatin 20 mg daily) and denies myalgias. His cholesterol is at goal. The cholesterol last visit was:   Lab Results  Component Value Date   CHOL 114 11/14/2019   HDL 47 11/14/2019   LDLCALC 52 11/14/2019   TRIG 70 11/14/2019   CHOLHDL 2.4 11/14/2019   He has been working on diet and exercise for glucose management, and denies increased appetite, nausea, paresthesia of the feet, polydipsia, polyuria, visual disturbances and vomiting. Last A1C in the office was:  Lab Results  Component Value Date   HGBA1C 5.7 (H) 11/14/2019   Last GFR Lab Results  Component Value Date   GFRNONAA 68 11/14/2019    Patient is on Vitamin D supplement and at goal at most recent check:    Lab Results  Component Value Date   VD25OH 84 11/14/2019      Medication Review: Current Outpatient Medications on File Prior to Visit  Medication Sig Dispense Refill  . aspirin EC 81 MG tablet Take 81 mg by mouth daily.    Marland Kitchen atorvastatin (LIPITOR) 20 MG tablet Take 1 tablet Daily for Cholesterol 90 tablet 3  . Cholecalciferol (VITAMIN D-3) 5000 UNITS TABS Take 10,000 Units by mouth.     . diclofenac (VOLTAREN) 75 MG EC tablet TAKE 1 TAB BY MOUTH 3 TIMES WITH FOOD AS NEEDED FOR PAIN / INFLAMMATION 270 tablet 1  . gabapentin (NEURONTIN) 300 MG capsule Take 300 mg by mouth 3 (three) times daily.     Marland Kitchen HYDROcodone-acetaminophen (NORCO/VICODIN) 5-325 MG tablet Take 1 tablet by mouth every 6 (six) hours as needed for moderate pain. Takes 2 talbets daily.    . traZODone (DESYREL) 50 MG tablet Take 1 to 2 tablets   1 hour before Bedtime as needed for Sleep 180 tablet 3  .  valsartan-hydrochlorothiazide (DIOVAN-HCT) 160-25 MG tablet TAKE 1 TABLET BY MOUTH EVERY DAY FOR BLOOD PRESSURE 90 tablet 1  . Zinc 50 MG TABS Take by mouth daily.    . varenicline (CHANTIX) 1 MG tablet Take 1 tablet 2 x /day for Nicotine Addiction (Patient not taking: Reported on 02/16/2020) 180 tablet 1   No current facility-administered medications on file prior to visit.    Allergies: No Known Allergies  Current Problems (verified) has Hyperlipidemia, mixed; Smoker; ED (erectile dysfunction); Right bundle branch block (RBBB) with left anterior fascicular block (LAFB); Essential hypertension; Vitamin D deficiency; Medication management; CKD (chronic kidney disease) stage 2, GFR 60-89 ml/min; Gastroesophageal reflux disease; Other abnormal glucose; Overweight (BMI 25.0-29.9); Acute medial meniscus tear of right knee; Primary osteoarthritis of right knee; and Aortic atherosclerosis by Chest CT(HCC) on their problem list.  Screening Tests Immunization History  Administered Date(s) Administered  . Influenza Split 07/05/2014  . Influenza, High Dose Seasonal PF 08/21/2015, 08/09/2019  . PFIZER SARS-COV-2 Vaccination 01/26/2020  . Pneumococcal Conjugate-13 05/20/2016  . Pneumococcal  Polysaccharide-23 10/06/2012, 11/14/2019  . Td 09/29/2006, 07/10/2017  . Zoster 08/21/2015   Preventative care: Last colonoscopy: 2008  Cologuard: 07/2017 neg due 07/2020, order at upcoming CPE   Smoker - CT lung 01/2020, + aortic atherosclerosis   Prior vaccinations: TD or Tdap: 2018  Influenza: 07/2019 Pneumococcal: 2014, 2021 Prevnar13: 2017 Shingles/Zostavax: 2016 Covid 19: 1/2, 2021, pfizer has next scheduled on Monday   Names of Other Physician/Practitioners you currently use: 1. [facility] here for primary care 2. Dr. Delman Cheadle, eye doctor, last visit 2021, goes q12 months, no issues 3. Dr. Lavone Neri, dentist, last visit 2021, goes q39m  Patient Care  Team: Unk Pinto, MD as PCP - General (Internal Medicine) Erline Levine, MD as Consulting Physician (Neurosurgery) Ladene Artist, MD as Consulting Physician (Gastroenterology) Sharyne Peach, MD as Consulting Physician (Ophthalmology) Minus Breeding, MD as Consulting Physician (Cardiology) Druscilla Brownie, MD as Consulting Physician (Dermatology)  Surgical: He  has a past surgical history that includes Uvuectomy; Appendectomy; Vasectomy; Adenoidectomy; Spine surgery (Aug 2016); and Dental surgery. Family His family history includes Glaucoma in his mother; Heart disease in his father; Heart disease (age of onset: 64) in his paternal grandfather; Hyperlipidemia in his father; Hypertension in his father and mother; Osteoporosis in his mother; Thyroid disease in his father and mother. Social history  He reports that he has been smoking cigarettes. He has a 44.00 pack-year smoking history. He has never used smokeless tobacco. He reports current alcohol use of about 20.0 standard drinks of alcohol per week. He reports that he does not use drugs.  MEDICARE WELLNESS OBJECTIVES: Physical activity:   Cardiac risk factors:   Depression/mood screen:   Depression screen [facility] 2/9 11/13/2019  Decreased Interest 0  Down, Depressed, Hopeless 0  PHQ - 2 Score 0    ADLs:  In your present state of health, do you have any difficulty performing the following activities: 11/13/2019 05/02/2019  Hearing? N N  Vision? N N  Difficulty concentrating or making decisions? N N  Walking or climbing stairs? N N  Dressing or bathing? N N  Doing errands, shopping? N N  Some recent data might be hidden     Cognitive Testing  Alert? Yes  Normal Appearance?Yes  Oriented to person? Yes  Place? Yes   Time? Yes  Recall of three objects?  Yes  Can perform simple calculations? Yes  Displays appropriate judgment?Yes  Can read the correct time from a watch face?Yes  EOL planning: Does Patient Have a Medical  Advance Directive?: Yes Type of Advance Directive: Healthcare Power of Attorney, Living will Does patient want to make changes to medical advance directive?: No - Patient declined Copy of [place] in Chart?: No - copy requested   Objective:   Today's Vitals   02/16/20 1404  BP: 124/74  Pulse: 85  Temp: 97.7 F (36.5 C)  SpO2: 95%  Weight: 211 lb (95.7 kg)  Height: 6\' 2"  (1.88 m)   Body mass index is 27.09 kg/m.  General Appearance: Well nourished, in no apparent distress. Eyes: PERRLA, EOMs, conjunctiva no swelling or erythema Sinuses: No Frontal/maxillary tenderness ENT/Mouth: Ext aud canals clear, TMs without erythema, bulging. No erythema, swelling, or exudate on post pharynx.  Tonsils not swollen or erythematous. Hearing normal.  Neck: Supple, thyroid normal.  Respiratory: Respiratory effort normal, BS equal bilaterally without rales, rhonchi, wheezing or stridor.  Cardio: RRR with no MRGs. Brisk peripheral pulses without edema.  Abdomen: Soft, + BS.  Non tender, no guarding, rebound, hernias, masses. Lymphatics: Non tender without lymphadenopathy.  Musculoskeletal: Full ROM, 5/5 strength, Normal gait Skin: Warm, dry without rashes, lesions, ecchymosis.  Neuro: Cranial nerves intact. No cerebellar symptoms.  Psych: Awake and oriented X 3, normal affect, Insight and Judgment appropriate.    Medicare Attestation I have personally reviewed: The patient's medical and social history Their use of alcohol, tobacco or illicit drugs Their current medications and supplements The patient's functional ability including ADLs,fall risks, home safety risks, cognitive, and hearing and visual impairment Diet and physical activities Evidence for depression or mood disorders  The patient's weight, height, BMI, and visual acuity have been recorded in the chart.  I have made referrals, counseling, and provided education to the patient based on review of the above and I  have provided the patient with a written personalized care plan for preventive services.     Izora Ribas, NP   02/16/2020

## 2020-02-16 ENCOUNTER — Other Ambulatory Visit: Payer: Self-pay

## 2020-02-16 ENCOUNTER — Ambulatory Visit (INDEPENDENT_AMBULATORY_CARE_PROVIDER_SITE_OTHER): Payer: Medicare Other | Admitting: Adult Health

## 2020-02-16 ENCOUNTER — Encounter: Payer: Self-pay | Admitting: Adult Health

## 2020-02-16 VITALS — BP 124/74 | HR 85 | Temp 97.7°F | Ht 74.0 in | Wt 211.0 lb

## 2020-02-16 DIAGNOSIS — Z0001 Encounter for general adult medical examination with abnormal findings: Secondary | ICD-10-CM | POA: Diagnosis not present

## 2020-02-16 DIAGNOSIS — K219 Gastro-esophageal reflux disease without esophagitis: Secondary | ICD-10-CM

## 2020-02-16 DIAGNOSIS — Z0000 Encounter for general adult medical examination without abnormal findings: Secondary | ICD-10-CM

## 2020-02-16 DIAGNOSIS — I452 Bifascicular block: Secondary | ICD-10-CM | POA: Diagnosis not present

## 2020-02-16 DIAGNOSIS — R6889 Other general symptoms and signs: Secondary | ICD-10-CM | POA: Diagnosis not present

## 2020-02-16 DIAGNOSIS — E559 Vitamin D deficiency, unspecified: Secondary | ICD-10-CM | POA: Diagnosis not present

## 2020-02-16 DIAGNOSIS — I700 Atherosclerosis of aorta: Secondary | ICD-10-CM | POA: Diagnosis not present

## 2020-02-16 DIAGNOSIS — E782 Mixed hyperlipidemia: Secondary | ICD-10-CM

## 2020-02-16 DIAGNOSIS — F17200 Nicotine dependence, unspecified, uncomplicated: Secondary | ICD-10-CM | POA: Diagnosis not present

## 2020-02-16 DIAGNOSIS — Z79899 Other long term (current) drug therapy: Secondary | ICD-10-CM | POA: Diagnosis not present

## 2020-02-16 DIAGNOSIS — N182 Chronic kidney disease, stage 2 (mild): Secondary | ICD-10-CM | POA: Diagnosis not present

## 2020-02-16 DIAGNOSIS — E663 Overweight: Secondary | ICD-10-CM

## 2020-02-16 DIAGNOSIS — I10 Essential (primary) hypertension: Secondary | ICD-10-CM | POA: Diagnosis not present

## 2020-02-16 DIAGNOSIS — R7309 Other abnormal glucose: Secondary | ICD-10-CM | POA: Diagnosis not present

## 2020-02-16 MED ORDER — VARENICLINE TARTRATE 1 MG PO TABS: ORAL_TABLET | ORAL | 2 refills | Status: DC

## 2020-02-16 NOTE — Patient Instructions (Addendum)
Arthur Garcia , Thank you for taking time to come for your Medicare Wellness Visit. I appreciate your ongoing commitment to your health goals. Please review the following plan we discussed and let me know if I can assist you in the future.   These are the goals we discussed: Goals    . Exercise 150 min/wk Moderate Activity    . Quit Smoking       This is a list of the screening recommended for you and due dates:  Health Maintenance  Topic Date Due  . COVID-19 Vaccine (2 - Pfizer 2-dose series) 02/16/2020  . Flu Shot  04/29/2020  . Cologuard (Stool DNA test)  08/27/2020  . Tetanus Vaccine  07/11/2027  .  Hepatitis C: One time screening is recommended by Center for Disease Control  (CDC) for  adults born from [birth year] through 1966.   Completed  . Pneumonia vaccines  Completed         SMOKING CESSATION WITH CHANTIX  [employer] cancer society  ([phone] for more information or for a free program for smoking cessation help.   You can call QUIT SMART 1-800-QUIT-NOW for free nicotine patches or replacement therapy- if they are out- keep calling  [place] [facility] Can call for smoking cessation classes, [phone]  If you have a smart phone, please look up Smoke Free app, this will help you stay on track and give you information about money you have saved, life that you have gained back and a ton of more information.   We are giving you chantix for smoking cessation. You can do it! And we are here to help! You may have heard some scary side effects about chantix, the three most common I hear about are nausea, crazy dreams and depression.  However, I like for my patients to try to stay on 1/2 a tablet twice a day rather than one tablet twice a day as normally prescribed. This helps decrease the chances of side effects and helps save money by making a one month prescription last two months  Please start the prescription this way:  Start 1/2 tablet by mouth once daily after food  with a full glass of water for 3 days Then do 1/2 tablet by mouth twice daily for 4 days. During this first week you can smoke, but try to stop after this week.  At this point we have several options: 1) continue on 1/2 tablet twice a day- which I encourage you to do. You can stay on this dose the rest of the time on the medication or if you still feel the need to smoke you can do one of the two options below. 2) do one tablet in the morning and 1/2 in the evening which helps decrease dreams. 3) do one tablet twice a day.   What if I miss a dose? If you miss a dose, take it as soon as you can. If it is almost time for your next dose, take only that dose. Do not take double or extra doses.  What should I watch for while using this medicine? Visit your doctor or health care professional for regular check ups. Ask for ongoing advice and encouragement from your doctor or healthcare professional, friends, and family to help you quit. If you smoke while on this medication, quit again  Your mouth may get dry. Chewing sugarless gum or hard candy, and drinking plenty of water may help. Contact your doctor if the problem does not go  away or is severe.  You may get drowsy or dizzy. Do not drive, use machinery, or do anything that needs mental alertness until you know how this medicine affects you. Do not stand or sit up quickly, especially if you are an older patient.   The use of this medicine may increase the chance of suicidal thoughts or actions. Pay special attention to how you are responding while on this medicine. Any worsening of mood, or thoughts of suicide or dying should be reported to your health care professional right away.  ADVANTAGES OF QUITTING SMOKING  Within 20 minutes, blood pressure decreases. Your pulse is at normal level.  After 8 hours, carbon monoxide levels in the blood return to normal. Your oxygen level increases.  After 24 hours, the chance of having a heart attack starts  to decrease. Your breath, hair, and body stop smelling like smoke.  After 48 hours, damaged nerve endings begin to recover. Your sense of taste and smell improve.  After 72 hours, the body is virtually free of nicotine. Your bronchial tubes relax and breathing becomes easier.  After 2 to 12 weeks, lungs can hold more air. Exercise becomes easier and circulation improves.  After 1 year, the risk of coronary heart disease is cut in half.  After 5 years, the risk of stroke falls to the same as a nonsmoker.  After 10 years, the risk of lung cancer is cut in half and the risk of other cancers decreases significantly.  After 15 years, the risk of coronary heart disease drops, usually to the level of a nonsmoker.  You will have extra money to spend on things other than cigarettes.

## 2020-02-17 ENCOUNTER — Other Ambulatory Visit: Payer: Self-pay | Admitting: Adult Health

## 2020-02-17 DIAGNOSIS — N289 Disorder of kidney and ureter, unspecified: Secondary | ICD-10-CM

## 2020-02-17 LAB — CBC WITH DIFFERENTIAL/PLATELET
Absolute Monocytes: 503 cells/uL (ref 200–950)
Basophils Absolute: 48 cells/uL (ref 0–200)
Basophils Relative: 0.7 %
Eosinophils Absolute: 170 cells/uL (ref 15–500)
Eosinophils Relative: 2.5 %
HCT: 39.7 % (ref 38.5–50.0)
Hemoglobin: 13.4 g/dL (ref 13.2–17.1)
Lymphs Abs: 1217 cells/uL (ref 850–3900)
MCH: 30.5 pg (ref 27.0–33.0)
MCHC: 33.8 g/dL (ref 32.0–36.0)
MCV: 90.4 fL (ref 80.0–100.0)
MPV: 10 fL (ref 7.5–12.5)
Monocytes Relative: 7.4 %
Neutro Abs: 4862 cells/uL (ref 1500–7800)
Neutrophils Relative %: 71.5 %
Platelets: 237 10*3/uL (ref 140–400)
RBC: 4.39 10*6/uL (ref 4.20–5.80)
RDW: 12 % (ref 11.0–15.0)
Total Lymphocyte: 17.9 %
WBC: 6.8 10*3/uL (ref 3.8–10.8)

## 2020-02-17 LAB — COMPLETE METABOLIC PANEL WITH GFR
AG Ratio: 2 (calc) (ref 1.0–2.5)
ALT: 25 U/L (ref 9–46)
AST: 22 U/L (ref 10–35)
Albumin: 4.7 g/dL (ref 3.6–5.1)
Alkaline phosphatase (APISO): 65 U/L (ref 35–144)
BUN/Creatinine Ratio: 18 (calc) (ref 6–22)
BUN: 26 mg/dL — ABNORMAL HIGH (ref 7–25)
CO2: 25 mmol/L (ref 20–32)
Calcium: 9.8 mg/dL (ref 8.6–10.3)
Chloride: 98 mmol/L (ref 98–110)
Creat: 1.47 mg/dL — ABNORMAL HIGH (ref 0.70–1.25)
GFR, Est African American: 56 mL/min/{1.73_m2} — ABNORMAL LOW (ref >=60)
GFR, Est Non African American: 48 mL/min/{1.73_m2} — ABNORMAL LOW (ref >=60)
Globulin: 2.4 g/dL (calc) (ref 1.9–3.7)
Glucose, Bld: 103 mg/dL — ABNORMAL HIGH (ref 65–99)
Potassium: 4.5 mmol/L (ref 3.5–5.3)
Sodium: 134 mmol/L — ABNORMAL LOW (ref 135–146)
Total Bilirubin: 0.9 mg/dL (ref 0.2–1.2)
Total Protein: 7.1 g/dL (ref 6.1–8.1)

## 2020-02-17 LAB — LIPID PANEL
Cholesterol: 126 mg/dL (ref <200)
HDL: 44 mg/dL (ref >=40)
LDL Cholesterol (Calc): 68 mg/dL (calc)
Non-HDL Cholesterol (Calc): 82 mg/dL (calc) (ref <130)
Total CHOL/HDL Ratio: 2.9 (calc) (ref <5.0)
Triglycerides: 59 mg/dL (ref <150)

## 2020-02-17 LAB — TSH: TSH: 0.91 mIU/L (ref 0.40–4.50)

## 2020-02-17 LAB — MAGNESIUM: Magnesium: 2.1 mg/dL (ref 1.5–2.5)

## 2020-02-20 ENCOUNTER — Ambulatory Visit: Payer: Medicare Other | Attending: Internal Medicine

## 2020-02-20 DIAGNOSIS — Z23 Encounter for immunization: Secondary | ICD-10-CM

## 2020-02-20 NOTE — Progress Notes (Signed)
   Covid-19 Vaccination Clinic  Name:  Arthur Garcia    MRN: [id number] DOB: 1950-10-30  02/20/2020  Mr. Arthur Garcia was observed post Covid-19 immunization for 15 minutes without incident. He was provided with Vaccine Information Sheet and instruction to access the V-Safe system.   Mr. Arthur Garcia was instructed to call 911 with any severe reactions post vaccine: Marland Kitchen Difficulty breathing  . Swelling of face and throat  . A fast heartbeat  . A bad rash all over body  . Dizziness and weakness   Immunizations Administered    Name Date Dose VIS Date Route   Pfizer COVID-19 Vaccine 02/20/2020 11:04 AM 0.3 mL 11/23/2018 Intramuscular   Manufacturer: [place]   Lot: [id number]   [place]: [id number]

## 2020-03-06 ENCOUNTER — Ambulatory Visit: Payer: Medicare Other | Admitting: *Deleted

## 2020-03-06 ENCOUNTER — Other Ambulatory Visit: Payer: Self-pay

## 2020-03-06 VITALS — BP 142/80 | HR 72 | Temp 97.6°F | Resp 16 | Wt 214.2 lb

## 2020-03-06 DIAGNOSIS — N289 Disorder of kidney and ureter, unspecified: Secondary | ICD-10-CM | POA: Diagnosis not present

## 2020-03-06 NOTE — Progress Notes (Signed)
Patient is here for a check of his BMP and urine, due to reduced kidney functions. Patient denies use of NSAIDS and states he did increase his water intake.

## 2020-03-07 LAB — URINALYSIS, ROUTINE W REFLEX MICROSCOPIC
Bilirubin Urine: NEGATIVE
Glucose, UA: NEGATIVE
Hgb urine dipstick: NEGATIVE
Ketones, ur: NEGATIVE
Leukocytes,Ua: NEGATIVE
Nitrite: NEGATIVE
Protein, ur: NEGATIVE
Specific Gravity, Urine: 1.008 (ref 1.001–1.03)
pH: 5.5 (ref 5.0–8.0)

## 2020-03-07 LAB — BASIC METABOLIC PANEL
BUN: 17 mg/dL (ref 7–25)
CO2: 26 mmol/L (ref 20–32)
Calcium: 9.1 mg/dL (ref 8.6–10.3)
Chloride: 98 mmol/L (ref 98–110)
Creat: 1.16 mg/dL (ref 0.70–1.25)
Glucose, Bld: 122 mg/dL — ABNORMAL HIGH (ref 65–99)
Potassium: 4.2 mmol/L (ref 3.5–5.3)
Sodium: 131 mmol/L — ABNORMAL LOW (ref 135–146)

## 2020-03-15 ENCOUNTER — Emergency Department (HOSPITAL_COMMUNITY): Payer: Medicare Other

## 2020-03-15 ENCOUNTER — Encounter (HOSPITAL_COMMUNITY): Payer: Self-pay | Admitting: *Deleted

## 2020-03-15 ENCOUNTER — Other Ambulatory Visit: Payer: Self-pay

## 2020-03-15 ENCOUNTER — Emergency Department (HOSPITAL_COMMUNITY)
Admission: EM | Admit: 2020-03-15 | Discharge: 2020-03-16 | Disposition: A | Discharge status: Home / Self Care | Payer: Medicare Other | Attending: Emergency Medicine | Admitting: Emergency Medicine

## 2020-03-15 DIAGNOSIS — N182 Chronic kidney disease, stage 2 (mild): Secondary | ICD-10-CM | POA: Insufficient documentation

## 2020-03-15 DIAGNOSIS — R1013 Epigastric pain: Secondary | ICD-10-CM | POA: Diagnosis present

## 2020-03-15 DIAGNOSIS — K8020 Calculus of gallbladder without cholecystitis without obstruction: Secondary | ICD-10-CM | POA: Diagnosis not present

## 2020-03-15 DIAGNOSIS — F17210 Nicotine dependence, cigarettes, uncomplicated: Secondary | ICD-10-CM | POA: Insufficient documentation

## 2020-03-15 DIAGNOSIS — I129 Hypertensive chronic kidney disease with stage 1 through stage 4 chronic kidney disease, or unspecified chronic kidney disease: Secondary | ICD-10-CM | POA: Insufficient documentation

## 2020-03-15 DIAGNOSIS — Z79899 Other long term (current) drug therapy: Secondary | ICD-10-CM | POA: Insufficient documentation

## 2020-03-15 LAB — COMPREHENSIVE METABOLIC PANEL
ALT: 59 U/L — ABNORMAL HIGH (ref 0–44)
AST: 99 U/L — ABNORMAL HIGH (ref 15–41)
Albumin: 4.3 g/dL (ref 3.5–5.0)
Alkaline Phosphatase: 71 U/L (ref 38–126)
Anion gap: 8 (ref 5–15)
BUN: 30 mg/dL — ABNORMAL HIGH (ref 8–23)
CO2: 27 mmol/L (ref 22–32)
Calcium: 9.2 mg/dL (ref 8.9–10.3)
Chloride: 100 mmol/L (ref 98–111)
Creatinine, Ser: 1.95 mg/dL — ABNORMAL HIGH (ref 0.61–1.24)
GFR calc Af Amer: 40 mL/min — ABNORMAL LOW (ref >60)
GFR calc non Af Amer: 34 mL/min — ABNORMAL LOW (ref >60)
Glucose, Bld: 167 mg/dL — ABNORMAL HIGH (ref 70–99)
Potassium: 4.6 mmol/L (ref 3.5–5.1)
Sodium: 135 mmol/L (ref 135–145)
Total Bilirubin: 0.9 mg/dL (ref 0.3–1.2)
Total Protein: 7.3 g/dL (ref 6.5–8.1)

## 2020-03-15 LAB — LIPASE, BLOOD: Lipase: 29 U/L (ref 11–51)

## 2020-03-15 LAB — CBC
HCT: 37.4 % — ABNORMAL LOW (ref 39.0–52.0)
Hemoglobin: 12.7 g/dL — ABNORMAL LOW (ref 13.0–17.0)
MCH: 31.5 pg (ref 26.0–34.0)
MCHC: 34 g/dL (ref 30.0–36.0)
MCV: 92.8 fL (ref 80.0–100.0)
Platelets: 199 10*3/uL (ref 150–400)
RBC: 4.03 MIL/uL — ABNORMAL LOW (ref 4.22–5.81)
RDW: 12.8 % (ref 11.5–15.5)
WBC: 4.6 10*3/uL (ref 4.0–10.5)
nRBC: 0 % (ref 0.0–0.2)

## 2020-03-15 LAB — TROPONIN I (HIGH SENSITIVITY): Troponin I (High Sensitivity): 3 ng/L (ref <18)

## 2020-03-15 MED ORDER — LIDOCAINE VISCOUS HCL 2 % MT SOLN
15.0000 mL | Freq: Once | OROMUCOSAL | Status: AC
  Administered 2020-03-15: 15 mL via ORAL
  Filled 2020-03-15: qty 15

## 2020-03-15 MED ORDER — ONDANSETRON HCL 4 MG/2ML IJ SOLN: 4.0000 mg | Freq: Once | INTRAMUSCULAR | Status: DC | PRN

## 2020-03-15 MED ORDER — SODIUM CHLORIDE 0.9% FLUSH
3.0000 mL | Freq: Once | INTRAVENOUS | Status: AC
  Administered 2020-03-15: 3 mL via INTRAVENOUS

## 2020-03-15 MED ORDER — ALUM & MAG HYDROXIDE-SIMETH 200-200-20 MG/5ML PO SUSP
30.0000 mL | Freq: Once | ORAL | Status: AC
  Administered 2020-03-15: 30 mL via ORAL
  Filled 2020-03-15: qty 30

## 2020-03-15 MED ORDER — MORPHINE SULFATE (PF) 4 MG/ML IV SOLN: 4.0000 mg | Freq: Once | INTRAVENOUS | Status: DC | PRN

## 2020-03-15 NOTE — ED Triage Notes (Signed)
Pt had sudden onset severe epigastric pain which began while driving.  Pt states that this pain lasted about 20 minutes and the pain caused him to become diaphoretic and near syncopal.  Pt then lay down on the ground bc he was feeling so unwell and all the pain and symptoms disapeared.  Pt is now pain free and feels fine.  EMS notes that pt has gall stones (were found incidentally on a CT in the past).  Pain was stabbing and caused him to "shake and sweat profusely"

## 2020-03-15 NOTE — Progress Notes (Signed)
Subjective:    Patient ID: Arthur Garcia, male    DOB: 1951-02-09, 69 y.o.   MRN: [id number]  HPI 69 y.o. WM presents with diarrhea x 1 month and ER follow up. He went to the ER yesterday for possible gallbladder attack, had AB [place].  He went to the ER yesterday with "attack" he had sudden epigastric pain, turned white, happened again during the hospital. Negative heart work up in the hospital, AB US showed gallstones but no inflammation.   He has had diarrhea x 2-3 weeks with bristol stool scale 6, fluffy, mushy. He had not alter his diet, no new meds other than zinc started in Feb.  Will have sudden urge to get to the restroom just once a day, no AB pain with it.  His normal is a 4, every 2-3 days.   Patient denies blood in stool, fever, recent antibiotic use, recent travel, significant abdominal pain. No weight loss, no night sweats.    Last colonoscopy: 2008  Cologuard: 07/2017 neg due 07/2020  AB imaging: 2005   Multiple small gallstones within the gallbladder with slight thickening of the gallbladder wall. Common bile duct is normal.  BMI is Body mass index is 27.09 kg/m. Wt Readings from Last 3 Encounters:  03/16/20 211 lb (95.7 kg)  03/15/20 210 lb (95.3 kg)  03/06/20 214 lb 3.2 oz (97.2 kg)   He  has a past surgical history that includes Uvuectomy; Appendectomy; Vasectomy; Adenoidectomy; Spine surgery (Aug 2016); and Dental surgery.   Blood pressure 126/84, pulse 62, temperature 97.7 F (36.5 C), weight 211 lb (95.7 kg), SpO2 97 %.  Medications   Current Outpatient Medications (Cardiovascular):  .  atorvastatin (LIPITOR) 20 MG tablet, Take 1 tablet Daily for Cholesterol .  valsartan-hydrochlorothiazide (DIOVAN-HCT) 160-25 MG tablet, TAKE 1 TABLET BY MOUTH EVERY DAY FOR BLOOD PRESSURE (Patient taking differently: Take 1 tablet by mouth daily. )   Current Outpatient Medications (Analgesics):  .  aspirin EC 81 MG tablet, Take 81 mg by mouth daily. .  diclofenac  (VOLTAREN) 75 MG EC tablet, TAKE 1 TAB BY MOUTH 3 TIMES WITH FOOD AS NEEDED FOR PAIN / INFLAMMATION (Patient taking differently: Take 75 mg by mouth 2 (two) times daily. ) .  HYDROcodone-acetaminophen (NORCO/VICODIN) 5-325 MG tablet, Take 1-2 tablets by mouth every 6 (six) hours as needed.   Current Outpatient Medications (Other):  Marland Kitchen  Cholecalciferol (VITAMIN D-3) 5000 UNITS TABS, Take 10,000 Units by mouth daily.  Marland Kitchen  gabapentin (NEURONTIN) 300 MG capsule, Take 300 mg by mouth 2 (two) times daily.  .  ondansetron (ZOFRAN ODT) 4 MG disintegrating tablet, Take 1 tablet (4 mg total) by mouth every 8 (eight) hours as needed for nausea or vomiting. .  traZODone (DESYREL) 50 MG tablet, Take 1 to 2 tablets   1 hour before Bedtime as needed for Sleep (Patient taking differently: Take 50 mg by mouth at bedtime. ) .  varenicline (CHANTIX) 1 MG tablet, Take 1 tablet 2 x /day for Nicotine Addiction .  Zinc 50 MG TABS, Take 50 mg by mouth daily.   Problem list He has Hyperlipidemia, mixed; Smoker; ED (erectile dysfunction); Right bundle branch block (RBBB) with left anterior fascicular block (LAFB); Essential hypertension; Vitamin D deficiency; Medication management; CKD (chronic kidney disease) stage 2, GFR 60-89 ml/min; Gastroesophageal reflux disease; Other abnormal glucose; Overweight (BMI 25.0-29.9); Acute medial meniscus tear of right knee; Primary osteoarthritis of right knee; and Aortic atherosclerosis by Chest CT(HCC) on their problem list.  Review of Systems  Constitutional: Negative for chills, fatigue and fever.  HENT: Negative.   Respiratory: Negative.   Cardiovascular: Negative.   Gastrointestinal: Positive for abdominal pain (episodes) and diarrhea. Negative for abdominal distention, anal bleeding, blood in stool, constipation, nausea, rectal pain and vomiting.  Genitourinary: Negative.   Musculoskeletal: Negative.   Neurological: Negative.        Objective:   Physical  Exam Constitutional:      Appearance: He is well-developed.  HENT:     Head: Normocephalic and atraumatic.     Right Ear: External ear normal.     Left Ear: External ear normal.  Eyes:     Conjunctiva/sclera: Conjunctivae normal.     Pupils: Pupils are equal, round, and reactive to light.  Cardiovascular:     Rate and Rhythm: Normal rate and regular rhythm.     Heart sounds: Normal heart sounds.  Pulmonary:     Effort: Pulmonary effort is normal.     Breath sounds: Normal breath sounds.  Abdominal:     General: Bowel sounds are normal.     Palpations: Abdomen is soft. There is no mass.     Tenderness: There is abdominal tenderness in the epigastric area. There is no right CVA tenderness, left CVA tenderness, guarding or rebound. Negative signs include Murphy's sign.  Musculoskeletal:        General: Normal range of motion.     Cervical back: Normal range of motion and neck supple.  Skin:    General: Skin is warm and dry.  Neurological:     Mental Status: He is alert and oriented to person, place, and time.     Cranial Nerves: No cranial nerve deficit.        Assessment & Plan:  Arthur Garcia was seen today for diarrhea.  Diagnoses and all orders for this visit:  Calculus of gallbladder without cholecystitis without obstruction -     Ambulatory referral to General Surgery -     CBC with Differential/Platelet -     COMPLETE METABOLIC PANEL WITH GFR - will recheck labs, LFTs were elevated at the ER - may need hepatitis panel, did not get this visit.  - will refer to gen surgeon for likely symptomatic cholelithiasis. - low fat diet   Diarrhea No red flags, no blood, AB pain ? From gallbladder Add on imodium Call if any worsening symptoms  The patient was advised to call immediately if he has any concerning symptoms in the interval. The patient voices understanding of current treatment options and is in agreement with the current care plan.The patient knows to call the clinic  with any problems, questions or concerns or go to the ER if any further progression of symptoms.

## 2020-03-15 NOTE — ED Provider Notes (Signed)
[facility] DEPT Provider Note   CSN: [id number] Arrival date & time: 03/15/20  2111     History Chief Complaint  Patient presents with  . Abdominal Pain    Arthur Garcia is a 69 y.o. male.  Patient presents to the emergency department with a chief complaint of epigastric abdominal pain.  He reports 2 severe episodes of epigastric pain tonight.  He states each lasted for about 10 to 15 minutes.  He states that the pain was so bad that it took his breath away and caused him to become diaphoretic.  He is currently pain-free.  He does not associate the pain with any known causes.  He reports that he has known gallstones, but has never had pain like this.  He denies having any vomiting, but did have an episode of nausea when the pain was most severe.  He denies any fevers or chills.  Denies any treatments prior to arrival.  Denies any numbness or tingling in his lower extremities.  Denies any pain radiating into his back.  The history is provided by the patient. No language interpreter was used.       Past Medical History:  Diagnosis Date  . Abnormal liver function test   . Anxiety   . Colon polyp   . DDD (degenerative disc disease)   . ED (erectile dysfunction) 04/05/2014  . FHx: heart disease 01/28/2018  . GERD (gastroesophageal reflux disease)   . Hyperlipidemia   . Hypertension   . Insomnia   . Tobacco dependence     Patient Active Problem List   Diagnosis Date Noted  . Aortic atherosclerosis by Chest CT(HCC) 02/13/2020  . Acute medial meniscus tear of right knee 10/05/2019  . Primary osteoarthritis of right knee 10/05/2019  . Overweight (BMI 25.0-29.9) 05/12/2018  . Other abnormal glucose 01/28/2018  . Gastroesophageal reflux disease 10/28/2017  . CKD (chronic kidney disease) stage 2, GFR 60-89 ml/min 10/27/2017  . Essential hypertension 02/12/2016  . Vitamin D deficiency 02/12/2016  . Medication management 02/12/2016  . Right bundle  branch block (RBBB) with left anterior fascicular block (LAFB) 04/11/2015  . ED (erectile dysfunction) 04/05/2014  . Smoker 10/31/2010  . Hyperlipidemia, mixed 10/30/2010    Past Surgical History:  Procedure Laterality Date  . ADENOIDECTOMY     age 12  . APPENDECTOMY    . DENTAL SURGERY     Dental implants  . SPINE SURGERY  Aug 2016   Dr. Vertell Limber  . Uvuectomy    . VASECTOMY         Family History  Problem Relation Age of Onset  . Thyroid disease Mother   . Hypertension Mother   . Osteoporosis Mother   . Glaucoma Mother   . Thyroid disease Father   . Hypertension Father   . Hyperlipidemia Father   . Heart disease Father        CHF  . Heart disease Paternal Grandfather 18       Long standing "heart trouble"    Social History   Tobacco Use  . Smoking status: Current Every Day Smoker    Packs/day: 1.00    Years: 45.00    Pack years: 45.00    Types: Cigarettes  . Smokeless tobacco: Never Used  . Tobacco comment: On and off, using chantix  Substance Use Topics  . Alcohol use: Yes    Alcohol/week: 20.0 standard drinks    Types: 20 Cans of beer per week  . Drug use:  No    Home Medications Prior to Admission medications   Medication Sig Start Date End Date Taking? Authorizing Provider  aspirin EC 81 MG tablet Take 81 mg by mouth daily.    [provider]  atorvastatin (LIPITOR) 20 MG tablet Take 1 tablet Daily for Cholesterol 06/26/19   Unk Pinto, MD  Cholecalciferol (VITAMIN D-3) 5000 UNITS TABS Take 10,000 Units by mouth.     [provider]  diclofenac (VOLTAREN) 75 MG EC tablet TAKE 1 TAB BY MOUTH 3 TIMES WITH FOOD AS NEEDED FOR PAIN / INFLAMMATION 01/30/20   Liane Comber, NP  gabapentin (NEURONTIN) 300 MG capsule Take 300 mg by mouth 3 (three) times daily.     [provider]  HYDROcodone-acetaminophen (NORCO/VICODIN) 5-325 MG tablet Take 1 tablet by mouth every 6 (six) hours as needed for moderate pain. Takes 2 talbets daily.     [provider]  traZODone (DESYREL) 50 MG tablet Take 1 to 2 tablets   1 hour before Bedtime as needed for Sleep 08/16/19   Unk Pinto, MD  valsartan-hydrochlorothiazide (DIOVAN-HCT) 160-25 MG tablet TAKE 1 TABLET BY MOUTH EVERY DAY FOR BLOOD PRESSURE 11/17/19   Liane Comber, NP  varenicline (CHANTIX) 1 MG tablet Take 1 tablet 2 x /day for Nicotine Addiction 02/16/20   Liane Comber, NP  Zinc 50 MG TABS Take by mouth daily.    [provider]    Allergies    Patient has no known allergies.  Review of Systems   Review of Systems  All other systems reviewed and are negative.   Physical Exam Updated Vital Signs BP (!) 147/74 (BP Location: Left Arm)   Pulse 73   Temp 98.1 F (36.7 C) (Oral)   Resp 16   Ht 6\' 2"  (1.88 m)   Wt 95.3 kg   SpO2 99%   BMI 26.96 kg/m   Physical Exam Vitals and nursing note reviewed.  Constitutional:      Appearance: He is well-developed.  HENT:     Head: Normocephalic and atraumatic.  Eyes:     Conjunctiva/sclera: Conjunctivae normal.  Cardiovascular:     Rate and Rhythm: Normal rate and regular rhythm.     Heart sounds: No murmur heard.   Pulmonary:     Effort: Pulmonary effort is normal. No respiratory distress.     Breath sounds: Normal breath sounds.  Abdominal:     Palpations: Abdomen is soft.     Tenderness: There is no abdominal tenderness.     Comments: No focal abdominal tenderness, no RLQ tenderness or pain at McBurney's point, no RUQ tenderness or Murphy's sign, no left-sided abdominal tenderness, no fluid wave, or signs of peritonitis   Musculoskeletal:     Cervical back: Neck supple.  Skin:    General: Skin is warm and dry.  Neurological:     Mental Status: He is alert and oriented to person, place, and time.  Psychiatric:        Mood and Affect: Mood normal.        Behavior: Behavior normal.     ED Results / Procedures / Treatments   Labs (all labs ordered are listed, but only abnormal  results are displayed) Labs Reviewed  COMPREHENSIVE METABOLIC PANEL - Abnormal; Notable for the following components:      Result Value   Glucose, Bld 167 (*)    BUN 30 (*)    Creatinine, Ser 1.95 (*)    AST 99 (*)    ALT  59 (*)    GFR calc non Af Amer 34 (*)    GFR calc Af Amer 40 (*)    All other components within normal limits  CBC - Abnormal; Notable for the following components:   RBC 4.03 (*)    Hemoglobin 12.7 (*)    HCT 37.4 (*)    All other components within normal limits  LIPASE, BLOOD  URINALYSIS, ROUTINE W REFLEX MICROSCOPIC  TROPONIN I (HIGH SENSITIVITY)    EKG None  Radiology No results found.  Procedures Procedures (including critical care time)  Medications Ordered in ED Medications  morphine 4 MG/ML injection 4 mg (has no administration in time range)  ondansetron (ZOFRAN) injection 4 mg (has no administration in time range)  sodium chloride flush (NS) 0.9 % injection 3 mL (3 mLs Intravenous Given 03/15/20 2132)  alum & mag hydroxide-simeth (MAALOX/MYLANTA) 200-200-20 MG/5ML suspension 30 mL (30 mLs Oral Given 03/15/20 2235)    And  lidocaine (XYLOCAINE) 2 % viscous mouth solution 15 mL (15 mLs Oral Given 03/15/20 2238)    ED Course  I have reviewed the triage vital signs and the nursing notes.  Pertinent labs & imaging results that were available during my care of the patient were reviewed by me and considered in my medical decision making (see chart for details).    MDM Rules/Calculators/A&P                          This patient complains of epigastric abdominal pain, this involves an extensive number of treatment options, and is a complaint that carries with it a high risk of complications and morbidity.  The differential diagnosis includes GERD, ACS, gallbladder colic.  Pertinent Labs I ordered, reviewed, and interpreted labs, which included mildly elevated AST and ALT, creatinine is also mildly elevated at 1.95 and BUN is 30.  Lipase is 29.   Troponin is 3x2.   Imaging Interpretation I ordered imaging studies which included right upper quadrant ultrasound secondary to the mildly elevated LFTs and epigastric pain.  I independently visualized and interpreted the right upper quadrant ultrasound, which showed gallstones, but common bile duct is normal diameter and normal gallbladder wall thickness, no evidence of acute cholecystitis.   Medications I ordered medication GI cocktail for pain.   Reassessments After the interventions stated above, I reevaluated the patient and found improved.  Symptoms have resolved.  Recommend follow-up with general surgery.  Final Clinical Impression(s) / ED Diagnoses Final diagnoses:  Calculus of gallbladder without cholecystitis without obstruction    Rx / DC Orders ED Discharge Orders         Ordered    HYDROcodone-acetaminophen (NORCO/VICODIN) 5-325 MG tablet  Every 6 hours PRN     Discontinue  Reprint     03/16/20 0129    ondansetron (ZOFRAN ODT) 4 MG disintegrating tablet  Every 8 hours PRN     Discontinue  Reprint     03/16/20 0129           Montine Circle, PA-C 03/16/20 0144    Drenda Freeze, MD 03/20/20 [phone]

## 2020-03-16 ENCOUNTER — Ambulatory Visit (INDEPENDENT_AMBULATORY_CARE_PROVIDER_SITE_OTHER): Payer: Medicare Other | Admitting: Physician Assistant

## 2020-03-16 ENCOUNTER — Encounter: Payer: Self-pay | Admitting: Physician Assistant

## 2020-03-16 ENCOUNTER — Other Ambulatory Visit: Payer: Self-pay

## 2020-03-16 VITALS — BP 126/84 | HR 62 | Temp 97.7°F | Wt 211.0 lb

## 2020-03-16 DIAGNOSIS — R7989 Other specified abnormal findings of blood chemistry: Secondary | ICD-10-CM

## 2020-03-16 DIAGNOSIS — K8020 Calculus of gallbladder without cholecystitis without obstruction: Secondary | ICD-10-CM | POA: Diagnosis not present

## 2020-03-16 LAB — TROPONIN I (HIGH SENSITIVITY): Troponin I (High Sensitivity): 3 ng/L (ref <18)

## 2020-03-16 MED ORDER — HYDROCODONE-ACETAMINOPHEN 5-325 MG PO TABS: 1.0000 | ORAL_TABLET | Freq: Four times a day (QID) | ORAL | 0 refills | Status: DC | PRN

## 2020-03-16 MED ORDER — ONDANSETRON 4 MG PO TBDP
4.0000 mg | ORAL_TABLET | Freq: Three times a day (TID) | ORAL | 0 refills | Status: DC | PRN
Start: 2020-03-16 — End: 2020-05-22

## 2020-03-16 NOTE — ED Notes (Signed)
Pt given diet coke per request

## 2020-03-16 NOTE — Patient Instructions (Signed)
Stop the zinc Will refer to gen surgery  Do low fat diet in the mean time  Please go to the ER if you have any severe AB pain, unable to hold down food/water, blood in stool or vomit, chest pain, shortness of breath, or any worsening symptoms.   Gallbladder Eating Plan If you have a gallbladder condition, you may have trouble digesting fats. Eating a low-fat diet can help reduce your symptoms, and may be helpful before and after having surgery to remove your gallbladder (cholecystectomy). Your health care provider may recommend that you work with a diet and nutrition specialist (dietitian) to help you reduce the amount of fat in your diet. What are tips for following this plan? General guidelines  Limit your fat intake to less than 30% of your total daily calories. If you eat around 1,800 calories each day, this is less than 60 grams (g) of fat per day.  Fat is an important part of a healthy diet. Eating a low-fat diet can make it hard to maintain a healthy body weight. Ask your dietitian how much fat, calories, and other nutrients you need each day.  Eat small, frequent meals throughout the day instead of three large meals.  Drink at least 8-10 cups of fluid a day. Drink enough fluid to keep your urine clear or pale yellow.  Limit alcohol intake to no more than 1 drink a day for nonpregnant women and 2 drinks a day for men. One drink equals 12 oz of beer, 5 oz of wine, or 1 oz of hard liquor. Reading food labels  Check Nutrition Facts on food labels for the amount of fat per serving. Choose foods with less than 3 grams of fat per serving. Shopping  Choose nonfat and low-fat healthy foods. Look for the words "nonfat," "low fat," or "fat free."  Avoid buying processed or prepackaged foods. Cooking  Cook using low-fat methods, such as baking, broiling, grilling, or boiling.  Cook with small amounts of healthy fats, such as olive oil, grapeseed oil, canola oil, or sunflower oil. What  foods are recommended?   All fresh, frozen, or canned fruits and vegetables.  Whole grains.  Low-fat or non-fat (skim) milk and yogurt.  Lean meat, skinless poultry, fish, eggs, and beans.  Low-fat protein supplement powders or drinks.  Spices and herbs. What foods are not recommended?  High-fat foods. These include baked goods, fast food, fatty cuts of meat, ice cream, french toast, sweet rolls, pizza, cheese bread, foods covered with butter, creamy sauces, or cheese.  Fried foods. These include french fries, tempura, battered fish, breaded chicken, fried breads, and sweets.  Foods with strong odors.  Foods that cause bloating and gas. Summary  A low-fat diet can be helpful if you have a gallbladder condition, or before and after gallbladder surgery.  Limit your fat intake to less than 30% of your total daily calories. This is about 60 g of fat if you eat 1,800 calories each day.  Eat small, frequent meals throughout the day instead of three large meals. This information is not intended to replace advice given to you by your health care provider. Make sure you discuss any questions you have with your health care provider. Document Revised: 01/06/2019 Document Reviewed: 10/23/2016 Elsevier Patient Education  [place].   Cholecystitis  Cholecystitis is inflammation of the gallbladder. It is often called a gallbladder attack. The gallbladder is a pear-shaped organ that lies beneath the liver on the right side of the  body. The gallbladder stores bile, which is a fluid that helps the body digest fats. If bile builds up in your gallbladder, your gallbladder becomes inflamed. This condition may occur suddenly. Cholecystitis is a serious condition and requires treatment. What are the causes? The most common cause of this condition is gallstones. Gallstones can block the tube (duct) that carries bile out of your gallbladder. This causes bile to build up. Other causes  include:  Damage to the gallbladder due to a decrease in blood flow.  Infections in the bile ducts.  Scars or kinks in the bile ducts.  Tumors in the liver, pancreas, or gallbladder. What increases the risk? You are more likely to develop this condition if:  You have sickle cell disease.  You take birth control pills or use estrogen.  You have alcoholic liver disease.  You have liver cirrhosis.  You have your nutrition delivered through a vein (parenteral nutrition).  You are critically ill.  You do not eat or drink for a long time. This is also called "fasting."  You are obese.  You lose weight too fast.  You are pregnant.  You have high levels of fat (triglycerides) in the blood.  You have pancreatitis. What are the signs or symptoms? Symptoms of this condition include:  Pain in the abdomen, especially in the upper right area of the abdomen.  Tenderness or bloating in the abdomen.  Nausea.  Vomiting.  Fever.  Chills. How is this diagnosed? This condition is diagnosed with a medical history and physical exam. You may also have other tests, including:  Imaging tests, such as: ? An ultrasound of the gallbladder. ? A CT scan of the abdomen. ? A gallbladder nuclear scan (HIDA scan). This scan allows your health care provider to see the bile moving from your liver to your gallbladder and on to your small intestine. ? MRI.  Blood tests, such as: ? A complete blood count. The white blood cell count may be higher than normal. ? Liver function tests. Certain types of gallstones cause some results to be higher than normal. How is this treated? Treatment may include:  Surgery to remove your gallbladder (cholecystectomy).  Antibiotic medicine, usually through an IV.  Fasting for a certain amount of time.  Giving IV fluids.  Medicine to treat pain or vomiting. Follow these instructions at home:  If you had surgery, follow instructions from your health  care provider about home care after the procedure. Medicines   Take over-the-counter and prescription medicines only as told by your health care provider.  If you were prescribed an antibiotic medicine, take it as told by your health care provider. Do not stop taking the antibiotic even if you start to feel better. General instructions  Follow instructions from your health care provider about what to eat or drink. When you are allowed to eat, avoid eating or drinking anything that triggers your symptoms.  Do not lift anything that is heavier than 10 lb (4.5 kg), or the limit that you are told, until your health care provider says that it is safe.  Do not use any products that contain nicotine or tobacco, such as cigarettes and e-cigarettes. If you need help quitting, ask your health care provider.  Keep all follow-up visits as told by your health care provider. This is important. Contact a health care provider if:  Your pain is not controlled with medicine.  You have a fever. Get help right away if:  Your pain moves to another  part of your abdomen or to your back.  You continue to have symptoms or you develop new symptoms even with treatment. Summary  Cholecystitis is inflammation of the gallbladder.  The most common cause of this condition is gallstones. Gallstones can block the tube (duct) that carries bile out of your gallbladder.  Common symptoms are pain in the abdomen, nausea, vomiting, fever, and chills.  This condition is treated with surgery to remove the gallbladder, medicines, fasting, and IV fluids.  Follow your health care provider's instructions for eating and drinking. Avoid eating anything that triggers your symptoms. This information is not intended to replace advice given to you by your health care provider. Make sure you discuss any questions you have with your health care provider. Document Revised: 01/22/2018 Document Reviewed: 01/22/2018 Elsevier Patient  Education  [place].

## 2020-03-16 NOTE — Discharge Instructions (Signed)
Your workup tonight showed gallstones in your gallbladder, but no evidence of infection.  You had mildly elevated liver function tests and a mildly elevated creatinine (kidney function).  You should drink more water and have these tests rechecked by your doctor in 1 week.  If you have worsening pain or vomiting, please return to the ER.  Please take pain medication as prescribed and don't drive while taking pain medication.    Please follow-up with your doctor and with the surgeons listed.

## 2020-03-17 LAB — COMPLETE METABOLIC PANEL WITH GFR
AG Ratio: 2.2 (calc) (ref 1.0–2.5)
ALT: 61 U/L — ABNORMAL HIGH (ref 9–46)
AST: 46 U/L — ABNORMAL HIGH (ref 10–35)
Albumin: 4.7 g/dL (ref 3.6–5.1)
Alkaline phosphatase (APISO): 72 U/L (ref 35–144)
BUN: 22 mg/dL (ref 7–25)
CO2: 29 mmol/L (ref 20–32)
Calcium: 9.9 mg/dL (ref 8.6–10.3)
Chloride: 102 mmol/L (ref 98–110)
Creat: 1.13 mg/dL (ref 0.70–1.25)
GFR, Est African American: 76 mL/min/{1.73_m2} (ref >=60)
GFR, Est Non African American: 66 mL/min/{1.73_m2} (ref >=60)
Globulin: 2.1 g/dL (calc) (ref 1.9–3.7)
Glucose, Bld: 116 mg/dL — ABNORMAL HIGH (ref 65–99)
Potassium: 4.4 mmol/L (ref 3.5–5.3)
Sodium: 136 mmol/L (ref 135–146)
Total Bilirubin: 0.5 mg/dL (ref 0.2–1.2)
Total Protein: 6.8 g/dL (ref 6.1–8.1)

## 2020-03-17 LAB — CBC WITH DIFFERENTIAL/PLATELET
Absolute Monocytes: 435 cells/uL (ref 200–950)
Basophils Absolute: 30 cells/uL (ref 0–200)
Basophils Relative: 0.6 %
Eosinophils Absolute: 130 cells/uL (ref 15–500)
Eosinophils Relative: 2.6 %
HCT: 40.6 % (ref 38.5–50.0)
Hemoglobin: 13.4 g/dL (ref 13.2–17.1)
Lymphs Abs: 855 cells/uL (ref 850–3900)
MCH: 30.7 pg (ref 27.0–33.0)
MCHC: 33 g/dL (ref 32.0–36.0)
MCV: 93.1 fL (ref 80.0–100.0)
MPV: 10.2 fL (ref 7.5–12.5)
Monocytes Relative: 8.7 %
Neutro Abs: 3550 cells/uL (ref 1500–7800)
Neutrophils Relative %: 71 %
Platelets: 223 10*3/uL (ref 140–400)
RBC: 4.36 10*6/uL (ref 4.20–5.80)
RDW: 12.1 % (ref 11.0–15.0)
Total Lymphocyte: 17.1 %
WBC: 5 10*3/uL (ref 3.8–10.8)

## 2020-05-08 ENCOUNTER — Other Ambulatory Visit: Payer: Self-pay | Admitting: Adult Health

## 2020-05-08 DIAGNOSIS — I10 Essential (primary) hypertension: Secondary | ICD-10-CM

## 2020-05-11 DIAGNOSIS — Z01818 Encounter for other preprocedural examination: Secondary | ICD-10-CM | POA: Diagnosis not present

## 2020-05-16 ENCOUNTER — Other Ambulatory Visit: Payer: Self-pay | Admitting: General Surgery

## 2020-05-16 DIAGNOSIS — K812 Acute cholecystitis with chronic cholecystitis: Secondary | ICD-10-CM | POA: Diagnosis not present

## 2020-05-16 DIAGNOSIS — K8010 Calculus of gallbladder with chronic cholecystitis without obstruction: Secondary | ICD-10-CM | POA: Diagnosis not present

## 2020-05-21 ENCOUNTER — Encounter: Payer: Self-pay | Admitting: Internal Medicine

## 2020-05-21 NOTE — Patient Instructions (Signed)

## 2020-05-21 NOTE — Progress Notes (Signed)
History of Present Illness:       This very nice 69 y.o.  MWM  presents for 6 month follow up with HTN, HLD, Pre-Diabetes and Vitamin D Deficiency.           Patient is treated for HTN  (2001) & BP has been controlled at home. Today's BP  is borderline high Normal at  140/90. Patient has had no complaints of any cardiac type chest pain, palpitations, dyspnea / orthopnea / PND, dizziness, claudication, or dependent edema.      Hyperlipidemia is controlled with diet & Atorvastatin. Patient denies myalgias or other med SE's. Last Lipids were at goal:  Lab Results  Component Value Date   CHOL 126 02/16/2020   HDL 44 02/16/2020   LDLCALC 68 02/16/2020   TRIG 59 02/16/2020   CHOLHDL 2.9 02/16/2020    Also, the patient has history of PreDiabetes  (A1c 5.9%/2015) and has had no symptoms of reactive hypoglycemia, diabetic polys, paresthesias or visual blurring.  Last A1c was near Normal:  Lab Results  Component Value Date   HGBA1C 5.7 (H) 11/14/2019       Further, the patient also has history of Vitamin D Deficiency and supplements vitamin D without any suspected side-effects. Last vitamin D was at goal:  Lab Results  Component Value Date   VD25OH 84 11/14/2019    Current Outpatient Medications on File Prior to Visit  Medication Sig  . aspirin EC 81 MG tablet Take 81 mg by mouth daily.  Marland Kitchen atorvastatin (LIPITOR) 20 MG tablet Take 1 tablet Daily for Cholesterol  . Cholecalciferol (VITAMIN D-3) 5000 UNITS TABS Take 10,000 Units by mouth daily.   . diclofenac (VOLTAREN) 75 MG EC tablet TAKE 1 TAB BY MOUTH 3 TIMES WITH FOOD AS NEEDED FOR PAIN / INFLAMMATION (Patient taking differently: Take 75 mg by mouth 2 (two) times daily. )  . gabapentin (NEURONTIN) 300 MG capsule Take 300 mg by mouth 2 (two) times daily.   Marland Kitchen HYDROcodone-acetaminophen (NORCO/VICODIN) 5-325 MG tablet Take 1-2 tablets by mouth every 6 (six) hours as needed.  . traZODone (DESYREL) 50 MG tablet Take 1 to 2  tablets   1 hour before Bedtime as needed for Sleep (Patient taking differently: Take 50 mg by mouth at bedtime. )  . valsartan-hydrochlorothiazide (DIOVAN-HCT) 160-25 MG tablet TAKE 1 TABLET BY MOUTH EVERY DAY FOR BLOOD PRESSURE  . varenicline (CHANTIX) 1 MG tablet Take 1 tablet 2 x /day for Nicotine Addiction (Patient not taking: Reported on 05/22/2020)  . Zinc 50 MG TABS Take 50 mg by mouth daily.  (Patient not taking: Reported on 05/22/2020)   No current facility-administered medications on file prior to visit.    No Known Allergies  PMHx:   Past Medical History:  Diagnosis Date  . Abnormal liver function test   . Anxiety   . Colon polyp   . DDD (degenerative disc disease)   . ED (erectile dysfunction) 04/05/2014  . FHx: heart disease 01/28/2018  . GERD (gastroesophageal reflux disease)   . Hyperlipidemia   . Hypertension   . Insomnia   . Tobacco dependence     Immunization History  Administered Date(s) Administered  . Influenza Split 07/05/2014  . Influenza, High Dose Seasonal PF 08/21/2015, 08/09/2019  . PFIZER SARS-COV-2 Vaccination 01/26/2020, 02/20/2020  . Pneumococcal Conjugate-13 05/20/2016  . Pneumococcal Polysaccharide-23 10/06/2012, 11/14/2019  . Td 09/29/2006, 07/10/2017  . Zoster 08/21/2015    Past Surgical History:  Procedure Laterality  Date  . ADENOIDECTOMY     age 67  . APPENDECTOMY    . DENTAL SURGERY     Dental implants  . SPINE SURGERY  Aug 2016   Dr. Vertell Limber  . Uvuectomy    . VASECTOMY      FHx:    Reviewed / unchanged  SHx:    Reviewed / unchanged   Systems Review:  Constitutional: Denies fever, chills, wt changes, headaches, insomnia, fatigue, night sweats, change in appetite. Eyes: Denies redness, blurred vision, diplopia, discharge, itchy, watery eyes.  ENT: Denies discharge, congestion, post nasal drip, epistaxis, sore throat, earache, hearing loss, dental pain, tinnitus, vertigo, sinus pain, snoring.  CV: Denies chest pain, palpitations,  irregular heartbeat, syncope, dyspnea, diaphoresis, orthopnea, PND, claudication or edema. Respiratory: denies cough, dyspnea, DOE, pleurisy, hoarseness, laryngitis, wheezing.  Gastrointestinal: Denies dysphagia, odynophagia, heartburn, reflux, water brash, abdominal pain or cramps, nausea, vomiting, bloating, diarrhea, constipation, hematemesis, melena, hematochezia  or hemorrhoids. Genitourinary: Denies dysuria, frequency, urgency, nocturia, hesitancy, discharge, hematuria or flank pain. Musculoskeletal: Denies arthralgias, myalgias, stiffness, jt. swelling, pain, limping or strain/sprain.  Skin: Denies pruritus, rash, hives, warts, acne, eczema or change in skin lesion(s). Neuro: No weakness, tremor, incoordination, spasms, paresthesia or pain. Psychiatric: Denies confusion, memory loss or sensory loss. Endo: Denies change in weight, skin or hair change.  Heme/Lymph: No excessive bleeding, bruising or enlarged lymph nodes.  Physical Exam  BP 140/90   Pulse 68   Temp (!) 97 F (36.1 C)   Resp 16   Ht 6\' 2"  (1.88 m)   Wt 200 lb 3.2 oz (90.8 kg)   BMI 25.70 kg/m   Appears  well nourished, well groomed  and in no distress.  Eyes: PERRLA, EOMs, conjunctiva no swelling or erythema. Sinuses: No frontal/maxillary tenderness ENT/Mouth: EAC's clear, TM's nl w/o erythema, bulging. Nares clear w/o erythema, swelling, exudates. Oropharynx clear without erythema or exudates. Oral hygiene is good. Tongue normal, non obstructing. Hearing intact.  Neck: Supple. Thyroid not palpable. Car 2+/2+ without bruits, nodes or JVD. Chest: Respirations nl with BS clear & equal w/o rales, rhonchi, wheezing or stridor.  Cor: Heart sounds normal w/ regular rate and rhythm without sig. murmurs, gallops, clicks or rubs. Peripheral pulses normal and equal  without edema.  Abdomen: Soft & bowel sounds normal. Non-tender w/o guarding, rebound, hernias, masses or organomegaly.  Lymphatics: Unremarkable.    Musculoskeletal: Full ROM all peripheral extremities, joint stability, 5/5 strength and normal gait.  Skin: Warm, dry without exposed rashes, lesions or ecchymosis apparent.  Neuro: Cranial nerves intact, reflexes equal bilaterally. Sensory-motor testing grossly intact. Tendon reflexes grossly intact.  Pysch: Alert & oriented x 3.  Insight and judgement nl & appropriate. No ideations.  Assessment and Plan:  1. Essential hypertension  - Continue medication, monitor blood pressure at home.  - Continue DASH diet.  Reminder to go to the ER if any CP,  SOB, nausea, dizziness, severe HA, changes vision/speech.  - CBC with Differential/Platelet - COMPLETE METABOLIC PANEL WITH GFR - Magnesium - TSH  2. Hyperlipidemia, mixed  - Continue diet/meds, exercise,& lifestyle modifications.  - Continue monitor periodic cholesterol/liver & renal functions   - Lipid panel - TSH  3. Abnormal glucose  - Continue diet, exercise  - Lifestyle modifications.  - Monitor appropriate labs.  - Hemoglobin A1c - Insulin, random  4. Vitamin D deficiency  - Continue supplementation.  - VITAMIN D 25 Hydroxy   5. Prediabetes  - Hemoglobin A1c  6. Medication management  -  CBC with Differential/Platelet - COMPLETE METABOLIC PANEL WITH GFR - Magnesium - Lipid panel - TSH - Hemoglobin A1c - Insulin, random - VITAMIN D 25 Hydroxy        Discussed  regular exercise, BP monitoring, weight control to achieve/maintain BMI less than 25 and discussed med and SE's. Recommended labs to assess and monitor clinical status with further disposition pending results of labs.  I discussed the assessment and treatment plan with the patient. The patient was provided an opportunity to ask questions and all were answered. The patient agreed with the plan and demonstrated an understanding of the instructions.  I provided over 30 minutes of exam, counseling, chart review and  complex critical decision  making.         The patient was advised to call back or seek an in-person evaluation if the symptoms worsen or if the condition fails to improve as anticipated.   Kirtland Bouchard, MD

## 2020-05-22 ENCOUNTER — Other Ambulatory Visit: Payer: Self-pay

## 2020-05-22 ENCOUNTER — Ambulatory Visit (INDEPENDENT_AMBULATORY_CARE_PROVIDER_SITE_OTHER): Payer: Medicare Other | Admitting: Internal Medicine

## 2020-05-22 VITALS — BP 140/90 | HR 68 | Temp 97.0°F | Resp 16 | Ht 74.0 in | Wt 200.2 lb

## 2020-05-22 DIAGNOSIS — R7303 Prediabetes: Secondary | ICD-10-CM | POA: Diagnosis not present

## 2020-05-22 DIAGNOSIS — Z79899 Other long term (current) drug therapy: Secondary | ICD-10-CM

## 2020-05-22 DIAGNOSIS — E782 Mixed hyperlipidemia: Secondary | ICD-10-CM

## 2020-05-22 DIAGNOSIS — E559 Vitamin D deficiency, unspecified: Secondary | ICD-10-CM | POA: Diagnosis not present

## 2020-05-22 DIAGNOSIS — I10 Essential (primary) hypertension: Secondary | ICD-10-CM

## 2020-05-22 DIAGNOSIS — R7309 Other abnormal glucose: Secondary | ICD-10-CM | POA: Diagnosis not present

## 2020-05-23 ENCOUNTER — Other Ambulatory Visit: Payer: Self-pay | Admitting: Internal Medicine

## 2020-05-23 DIAGNOSIS — I10 Essential (primary) hypertension: Secondary | ICD-10-CM

## 2020-05-23 DIAGNOSIS — R7303 Prediabetes: Secondary | ICD-10-CM

## 2020-05-23 DIAGNOSIS — N179 Acute kidney failure, unspecified: Secondary | ICD-10-CM

## 2020-05-23 LAB — CBC WITH DIFFERENTIAL/PLATELET
Absolute Monocytes: 528 cells/uL (ref 200–950)
Basophils Absolute: 60 cells/uL (ref 0–200)
Basophils Relative: 1 %
Eosinophils Absolute: 222 cells/uL (ref 15–500)
Eosinophils Relative: 3.7 %
HCT: 37.4 % — ABNORMAL LOW (ref 38.5–50.0)
Hemoglobin: 12.7 g/dL — ABNORMAL LOW (ref 13.2–17.1)
Lymphs Abs: 1020 cells/uL (ref 850–3900)
MCH: 30.8 pg (ref 27.0–33.0)
MCHC: 34 g/dL (ref 32.0–36.0)
MCV: 90.6 fL (ref 80.0–100.0)
MPV: 9.8 fL (ref 7.5–12.5)
Monocytes Relative: 8.8 %
Neutro Abs: 4170 cells/uL (ref 1500–7800)
Neutrophils Relative %: 69.5 %
Platelets: 306 10*3/uL (ref 140–400)
RBC: 4.13 10*6/uL — ABNORMAL LOW (ref 4.20–5.80)
RDW: 11.9 % (ref 11.0–15.0)
Total Lymphocyte: 17 %
WBC: 6 10*3/uL (ref 3.8–10.8)

## 2020-05-23 LAB — COMPLETE METABOLIC PANEL WITH GFR
AG Ratio: 1.6 (calc) (ref 1.0–2.5)
ALT: 20 U/L (ref 9–46)
AST: 14 U/L (ref 10–35)
Albumin: 4.1 g/dL (ref 3.6–5.1)
Alkaline phosphatase (APISO): 63 U/L (ref 35–144)
BUN/Creatinine Ratio: 18 (calc) (ref 6–22)
BUN: 32 mg/dL — ABNORMAL HIGH (ref 7–25)
CO2: 30 mmol/L (ref 20–32)
Calcium: 9.7 mg/dL (ref 8.6–10.3)
Chloride: 98 mmol/L (ref 98–110)
Creat: 1.74 mg/dL — ABNORMAL HIGH (ref 0.70–1.25)
GFR, Est African American: 45 mL/min/{1.73_m2} — ABNORMAL LOW (ref >=60)
GFR, Est Non African American: 39 mL/min/{1.73_m2} — ABNORMAL LOW (ref >=60)
Globulin: 2.6 g/dL (calc) (ref 1.9–3.7)
Glucose, Bld: 107 mg/dL — ABNORMAL HIGH (ref 65–99)
Potassium: 4.2 mmol/L (ref 3.5–5.3)
Sodium: 136 mmol/L (ref 135–146)
Total Bilirubin: 0.4 mg/dL (ref 0.2–1.2)
Total Protein: 6.7 g/dL (ref 6.1–8.1)

## 2020-05-23 LAB — INSULIN, RANDOM: Insulin: 7.1 u[IU]/mL

## 2020-05-23 LAB — TSH: TSH: 0.76 mIU/L (ref 0.40–4.50)

## 2020-05-23 LAB — VITAMIN D 25 HYDROXY (VIT D DEFICIENCY, FRACTURES): Vit D, 25-Hydroxy: 108 ng/mL — ABNORMAL HIGH (ref 30–100)

## 2020-05-23 LAB — HEMOGLOBIN A1C
Hgb A1c MFr Bld: 6 % of total Hgb — ABNORMAL HIGH (ref <5.7)
Mean Plasma Glucose: 126 (calc)
eAG (mmol/L): 7 (calc)

## 2020-05-23 LAB — MAGNESIUM: Magnesium: 1.5 mg/dL (ref 1.5–2.5)

## 2020-05-23 LAB — LIPID PANEL
Cholesterol: 116 mg/dL (ref <200)
HDL: 27 mg/dL — ABNORMAL LOW (ref >=40)
LDL Cholesterol (Calc): 73 mg/dL (calc)
Non-HDL Cholesterol (Calc): 89 mg/dL (calc) (ref <130)
Total CHOL/HDL Ratio: 4.3 (calc) (ref <5.0)
Triglycerides: 77 mg/dL (ref <150)

## 2020-05-23 MED ORDER — VALSARTAN 160 MG PO TABS: ORAL_TABLET | ORAL | 0 refills | Status: DC

## 2020-05-23 NOTE — Progress Notes (Signed)
====================================================  - Test results slightly outside the reference range are not unusual. If there is anything important, I will review this with you,  otherwise it is considered normal test values.  If you have further questions,  please do not hesitate to contact me at the office or via My Chart.  ====================================================  -  Kidney Functions are Down again - BUN & Creat increased and   GFR down to 39 in Stage 3b CKD (Chronic Kidney Disease)    - So STOP your Diclofenac (Voltaren ) which is a common cause of  Kidney Impairment / Failure   And Replace with Tylenol  (Acetaminophen)  1,000 mg     3 to 4 x /day     for pain   - Also Stop Valsartan - HCTZ  (Diovan- HCT) to get rid of the  Fluid Pill  (HCTZ)   - And will send in New Rx to Replace with Plain Diovan (Valsartan)   without the HCT to your [facility]   - Also Extremely to increase water & Fluid intake to "Flush" the  Kidneys  - So drink at least 6 bottles  (16 oz) of water  or liquids /fluids  /day  (6  x 16 = 96 to 100 oz /day !)   - Please call office to schedule a Nurse Visit in 3 week to  recheck Kidney functions.  ====================================================  -   Magnesium  -   1.5   -  very  low- goal is betw 2.0 - 2.5,   - So..............Marland Kitchen  Recommend that you take  Magnesium 500 mg tablet x 2 tablets  / daily   - also important to eat lots of  leafy green vegetables   - spinach - Kale - collards - greens - okra - asparagus  - broccoli - quinoa - squash - almonds   - black, red, white beans  -  peas - green beans ====================================================  -  Total Chol = 116 and LDL Chol = 73 - Both  Excellent   - Very low risk for Heart Attack  / Stroke ====================================================  - A1c (12 week blood sugar) has gone up from 5.7% to now 6.0%   and   Ideal or Goal is less than 5.7%   -   So.....................  Blood sugar and A1c are elevated in the borderline and  early or pre-diabetes range which has the same   300% increased risk for heart attack, stroke, cancer and   alzheimer- type vascular dementia as full blown diabetes.   But the good news is that diet, exercise with  weight loss can cure the early diabetes at this point. ====================================================  -  It is very important that you work harder with diet by  avoiding all foods that are white except chicken,   fish & calliflower.  - Avoid white rice  (brown & wild rice is OK),   - Avoid white potatoes  (sweet potatoes in moderation is OK),   White bread or wheat bread or anything made out of   white flour like bagels, donuts, rolls, buns, biscuits, cakes,  - pastries, cookies, pizza crust, and pasta (made from  white flour & egg whites)   - vegetarian pasta or spinach or wheat pasta is OK.  - Multigrain breads like Arnold's, Pepperidge Farm or   multigrain sandwich thins or high fiber breads like   Eureka bread or "Dave's Killer" breads that are  4 to 5  grams fiber per slice !  are best.    Diet, exercise and weight loss can reverse and cure  diabetes in the early stages.   ====================================================  -  Vitamin D = OK - Keep dose same  ====================================================  -  All Else - CBC - Kidneys - Electrolytes - Liver  & Thyroid    - all  Normal / OK ====================================================

## 2020-05-24 ENCOUNTER — Ambulatory Visit: Payer: Medicare Other | Admitting: Internal Medicine

## 2020-05-26 ENCOUNTER — Encounter: Payer: Self-pay | Admitting: Internal Medicine

## 2020-05-30 HISTORY — PX: LAPAROSCOPIC CHOLECYSTECTOMY: SUR755

## 2020-06-19 ENCOUNTER — Other Ambulatory Visit: Payer: Self-pay

## 2020-06-19 ENCOUNTER — Ambulatory Visit (INDEPENDENT_AMBULATORY_CARE_PROVIDER_SITE_OTHER): Payer: Medicare Other

## 2020-06-19 DIAGNOSIS — I10 Essential (primary) hypertension: Secondary | ICD-10-CM

## 2020-06-19 DIAGNOSIS — N179 Acute kidney failure, unspecified: Secondary | ICD-10-CM

## 2020-06-19 DIAGNOSIS — R7303 Prediabetes: Secondary | ICD-10-CM | POA: Diagnosis not present

## 2020-06-19 DIAGNOSIS — M5417 Radiculopathy, lumbosacral region: Secondary | ICD-10-CM | POA: Diagnosis not present

## 2020-06-19 DIAGNOSIS — N189 Chronic kidney disease, unspecified: Secondary | ICD-10-CM | POA: Diagnosis not present

## 2020-06-19 NOTE — Progress Notes (Signed)
Pt had no concerns or questions at this time.

## 2020-06-21 NOTE — Progress Notes (Signed)
========================================================== -   Test results slightly outside the reference range are not unusual. If there is anything important, I will review this with you,  otherwise it is considered normal test values.  If you have further questions,  please do not hesitate to contact me at the office or via My Chart.  ==========================================================   -  Kidney Functions much  much better . Virtually back to Normal      - Stay off of the Voltaren / Diclofenac and continue lots of liquids   - I presume you're feeling better  & BP's have been "OK"   - bill

## 2020-06-22 LAB — COMPLETE METABOLIC PANEL WITH GFR
AG Ratio: 1.6 (calc) (ref 1.0–2.5)
ALT: 10 U/L (ref 9–46)
AST: 12 U/L (ref 10–35)
Albumin: 4.2 g/dL (ref 3.6–5.1)
Alkaline phosphatase (APISO): 90 U/L (ref 35–144)
BUN: 15 mg/dL (ref 7–25)
CO2: 33 mmol/L — ABNORMAL HIGH (ref 20–32)
Calcium: 9.7 mg/dL (ref 8.6–10.3)
Chloride: 100 mmol/L (ref 98–110)
Creat: 1.24 mg/dL (ref 0.70–1.25)
GFR, Est African American: 68 mL/min/{1.73_m2} (ref >=60)
GFR, Est Non African American: 59 mL/min/{1.73_m2} — ABNORMAL LOW (ref >=60)
Globulin: 2.6 g/dL (calc) (ref 1.9–3.7)
Glucose, Bld: 136 mg/dL — ABNORMAL HIGH (ref 65–99)
Potassium: 4.2 mmol/L (ref 3.5–5.3)
Sodium: 138 mmol/L (ref 135–146)
Total Bilirubin: 0.5 mg/dL (ref 0.2–1.2)
Total Protein: 6.8 g/dL (ref 6.1–8.1)

## 2020-06-22 LAB — FRUCTOSAMINE: Fructosamine: 243 umol/L (ref 205–285)

## 2020-06-22 LAB — MAGNESIUM: Magnesium: 1.7 mg/dL (ref 1.5–2.5)

## 2020-06-22 NOTE — Progress Notes (Signed)
============================  -   Fructosamine returned today &  is a 6 week average blood sugar &   is Normal ! - Great   ============================

## 2020-06-23 ENCOUNTER — Other Ambulatory Visit: Payer: Self-pay | Admitting: Internal Medicine

## 2020-08-08 ENCOUNTER — Other Ambulatory Visit: Payer: Self-pay | Admitting: Internal Medicine

## 2020-08-22 ENCOUNTER — Ambulatory Visit: Payer: Medicare Other | Admitting: Adult Health Nurse Practitioner

## 2020-08-27 NOTE — Progress Notes (Signed)
3 MONTH FOLLOW UP Assessment:   Atherosclerosis of aorta Per CT 01/2020 Control blood pressure, cholesterol, glucose, increase exercise.   Essential hypertension Continue valsartan Monitor blood pressure at home regularly; call if consistently over 130/80 Continue DASH diet.   Reminder to go to the ER if any CP, SOB, nausea, dizziness, severe HA, changes vision/speech, left arm numbness and tingling and jaw pain.  Other male erectile dysfunction Treated by Cialis PRN; no concerns or complications Established with urology; follow up PRN  Hyperlipidemia, unspecified hyperlipidemia type Well controlled; continue statin medication Continue low cholesterol diet and exercise.  -     Lipid panel -     TSH  Former smoker Commended cessation; maintenance strategies discussed Denies concerning resp sx CT screening neg 01/2020; repeat annually   Other abnormal glucose/prediabetes A1Cs intermittently in prediabetic range Discussed disease and risks Discussed diet/exercise, weight management  Check A1C q47m; defer today, check CMP -     CMP WITH GFR  Vitamin D deficiency Above goal; - advised to alternate between 5000 IU and 10000 IU until next OV  Defer vitamin D level   Medication management -     CBC with Differential/Platelet -     CMP/GFR -     Magnesium   CKD (chronic kidney disease) stage 2/3 Increase fluids, avoid NSAIDS, monitor sugars, will monitor -      CMP/GFR  GERD Well managed off of medications at this time Discussed diet, avoiding triggers and other lifestyle changes  Myalgias Limited myalgias ? Following covid 19 vaccination 6 months ago, benign exam, mild/persistent; no other concerning accompaniments; is on statin, will check CK, then consider steroid taper and monitoring  Need for influenza vaccine High dose quadrivalent administered without complication today    Over 30 minutes of exam, counseling, chart review, and critical decision making was  performed  Future Appointments  Date Time Provider [place]  12/04/2020  9:00 AM Unk Pinto, MD [facility] None     Subjective:  Arthur Garcia is a 69 y.o. male who presents and 3 month follow up for HTN, hyperlipidemia, glucose management (hx of prediabetes), and vitamin D Def.   Had lab cholecystectomy in 05/2020 by Dr. Kieth Brightly, has done well since.   He has hx of DDD s/p lumbar discectomy x 2 by Dr. Vertell Limber in 2016 - he continues to experience pain and discomfort related to this, currently prescribed norco and neurontin and managed by pain management via Dr. Melven Sartorius office. Follows Dr. Erlinda Hong for R knee pain, dx with meniscal tear and arthritis but improved without intervention and monitoring only. Patient reports no pain in knee at this time.   He reports having new myalgias, limited to hamstrings and feet, notes this is different pain from typical lower back, he feels started after 2nd covid 19 vaccine 6 months ago, mild, persistent is able to manage with extra strength tylenol, not any worse with exertion vs rest. Reports not debilitation, no weakness, just persistently annoying. On statin, but has been stable on current dose for many years, no recent dose adjustment.   he recently quit smoking 04/2020 prior to gallbladder surgery using chantix. He has 30+ pack/yr smoking history and underwent low dose CT screening in 01/2020 which was negative other than aortic atherosclerosis, did shows some cholelithiases.   BMI is Body mass index is 26.96 kg/m., he has been working on diet and exercise, no recent intentional exercise, working on projects around the house and generally active, would like  to get back to <205 lb.  Wt Readings from Last 3 Encounters:  08/28/20 210 lb (95.3 kg)  06/19/20 205 lb (93 kg)  05/22/20 200 lb 3.2 oz (90.8 kg)   He reports well controlled BPs at home, wife checks, today their BP is BP: 122/70 He does workout. He denies chest pain, shortness of breath,  dizziness.   He is on cholesterol medication (atorvastatin 20 mg daily for many years), has not had myalgias until recently. His cholesterol is at goal. The cholesterol last visit was:   Lab Results  Component Value Date   CHOL 116 05/22/2020   HDL 27 (L) 05/22/2020   LDLCALC 73 05/22/2020   TRIG 77 05/22/2020   CHOLHDL 4.3 05/22/2020   He has been working on diet and exercise for glucose management, and denies increased appetite, nausea, paresthesia of the feet, polydipsia, polyuria, visual disturbances and vomiting. Last A1C in the office was:  Lab Results  Component Value Date   HGBA1C 6.0 (H) 05/22/2020   Hx of CKD II, recent with acute decline but did improve after d/cing diclofenac and HCTZ. He reports mostly diet coke, has added some water, also does sweet tea. Last GFR Lab Results  Component Value Date   GFRNONAA 59 (L) 06/19/2020   GFRNONAA 39 (L) 05/22/2020   GFRNONAA 66 03/16/2020    Patient is on Vitamin D supplement and above goal at most recent check, still taking 10000 IU, hasn't reduced, was previously taking 5000 IU:    Lab Results  Component Value Date   VD25OH 108 (H) 05/22/2020      Medication Review: Current Outpatient Medications on File Prior to Visit  Medication Sig Dispense Refill  . aspirin EC 81 MG tablet Take 81 mg by mouth daily.    Marland Kitchen atorvastatin (LIPITOR) 20 MG tablet TAKE 1 TABLET BY MOUTH EVERY DAY FOR CHOLESTEROL 90 tablet 0  . Cholecalciferol (VITAMIN D-3) 5000 UNITS TABS Take 10,000 Units by mouth daily.     Marland Kitchen gabapentin (NEURONTIN) 300 MG capsule Take 300 mg by mouth 2 (two) times daily.     Marland Kitchen HYDROcodone-acetaminophen (NORCO/VICODIN) 5-325 MG tablet Take 1-2 tablets by mouth every 6 (six) hours as needed. 10 tablet 0  . traZODone (DESYREL) 50 MG tablet Take 1 tablet (50 mg total) by mouth at bedtime. 90 tablet 3  . valsartan (DIOVAN) 160 MG tablet Take 1 tablet Daily for BP 90 tablet 0  . varenicline (CHANTIX) 1 MG tablet Take 1 tablet 2 x  /day for Nicotine Addiction (Patient not taking: Reported on 05/22/2020) 60 tablet 2  . Zinc 50 MG TABS Take 50 mg by mouth daily.  (Patient not taking: Reported on 05/22/2020)     No current facility-administered medications on file prior to visit.    Allergies: No Known Allergies  Current Problems (verified) has Hyperlipidemia, mixed; Smoker; ED (erectile dysfunction); Right bundle branch block (RBBB) with left anterior fascicular block (LAFB); Essential hypertension; Vitamin D deficiency; Medication management; CKD (chronic kidney disease) stage 2, GFR 60-89 ml/min; Gastroesophageal reflux disease; Other abnormal glucose; Overweight (BMI 25.0-29.9); Acute medial meniscus tear of right knee; Primary osteoarthritis of right knee; and Aortic atherosclerosis by Chest CT(HCC) on their problem list.  Surgical: He  has a past surgical history that includes Uvuectomy; Appendectomy; Vasectomy; Adenoidectomy; Spine surgery (Aug 2016); Dental surgery; and Laparoscopic cholecystectomy (05/2020). Family His family history includes Glaucoma in his mother; Heart disease in his father; Heart disease (age of onset: 13) in his paternal  grandfather; Hyperlipidemia in his father; Hypertension in his father and mother; Osteoporosis in his mother; Thyroid disease in his father and mother. Social history  He reports that he quit smoking about 3 months ago. His smoking use included cigarettes. He has a 45.00 pack-year smoking history. He has never used smokeless tobacco. He reports current alcohol use of about 20.0 standard drinks of alcohol per week. He reports that he does not use drugs.  Review of Systems  Constitutional: Negative for malaise/fatigue and weight loss.  HENT: Negative for hearing loss and tinnitus.   Eyes: Negative for blurred vision and double vision.  Respiratory: Negative for cough, shortness of breath and wheezing.   Cardiovascular: Negative for chest pain, palpitations, orthopnea,  claudication and leg swelling.  Gastrointestinal: Negative for abdominal pain, blood in stool, constipation, diarrhea, heartburn, melena, nausea and vomiting.  Genitourinary: Negative.   Musculoskeletal: Positive for myalgias (bil hamstrings x 6 months, no weakness). Negative for joint pain.  Skin: Negative for rash.  Neurological: Negative for dizziness, tingling, sensory change, weakness and headaches.  Endo/Heme/Allergies: Negative for polydipsia.  Psychiatric/Behavioral: Negative.   All other systems reviewed and are negative.    Objective:   Today's Vitals   08/28/20 1110  BP: 122/70  Pulse: 63  Temp: (!) 97.3 F (36.3 C)  SpO2: 97%  Weight: 210 lb (95.3 kg)   Body mass index is 26.96 kg/m.  General Appearance: Well nourished, in no apparent distress. Eyes: PERRLA, EOMs, conjunctiva no swelling or erythema Sinuses: No Frontal/maxillary tenderness ENT/Mouth: Ext aud canals clear, TMs without erythema, bulging. No erythema, swelling, or exudate on post pharynx.  Tonsils not swollen or erythematous. Hearing normal.  Neck: Supple, thyroid normal.  Respiratory: Respiratory effort normal, BS equal bilaterally without rales, rhonchi, wheezing or stridor.  Cardio: RRR with no MRGs. Brisk peripheral pulses without edema.  Abdomen: Soft, + BS.  Non tender, no guarding, rebound, hernias, masses. Lymphatics: Non tender without lymphadenopathy.  Musculoskeletal: Full ROM, 5/5 strength, Normal gait, no specific tenderness of palpable abnormality of bil lower extremity musculature.  Skin: Warm, dry without rashes, lesions, ecchymosis.  Neuro: Cranial nerves intact. No cerebellar symptoms.  Psych: Awake and oriented X 3, normal affect, Insight and Judgment appropriate.     Izora Ribas, NP   08/28/2020

## 2020-08-28 ENCOUNTER — Encounter: Payer: Self-pay | Admitting: Adult Health

## 2020-08-28 ENCOUNTER — Ambulatory Visit (INDEPENDENT_AMBULATORY_CARE_PROVIDER_SITE_OTHER): Payer: Medicare Other | Admitting: Adult Health

## 2020-08-28 ENCOUNTER — Other Ambulatory Visit: Payer: Self-pay

## 2020-08-28 VITALS — BP 122/70 | HR 63 | Temp 97.3°F | Wt 210.0 lb

## 2020-08-28 DIAGNOSIS — Z87891 Personal history of nicotine dependence: Secondary | ICD-10-CM

## 2020-08-28 DIAGNOSIS — M791 Myalgia, unspecified site: Secondary | ICD-10-CM

## 2020-08-28 DIAGNOSIS — E559 Vitamin D deficiency, unspecified: Secondary | ICD-10-CM | POA: Diagnosis not present

## 2020-08-28 DIAGNOSIS — R7309 Other abnormal glucose: Secondary | ICD-10-CM

## 2020-08-28 DIAGNOSIS — N401 Enlarged prostate with lower urinary tract symptoms: Secondary | ICD-10-CM | POA: Diagnosis not present

## 2020-08-28 DIAGNOSIS — E782 Mixed hyperlipidemia: Secondary | ICD-10-CM | POA: Diagnosis not present

## 2020-08-28 DIAGNOSIS — Z23 Encounter for immunization: Secondary | ICD-10-CM | POA: Diagnosis not present

## 2020-08-28 DIAGNOSIS — F17200 Nicotine dependence, unspecified, uncomplicated: Secondary | ICD-10-CM

## 2020-08-28 DIAGNOSIS — K219 Gastro-esophageal reflux disease without esophagitis: Secondary | ICD-10-CM | POA: Diagnosis not present

## 2020-08-28 DIAGNOSIS — N182 Chronic kidney disease, stage 2 (mild): Secondary | ICD-10-CM

## 2020-08-28 DIAGNOSIS — I700 Atherosclerosis of aorta: Secondary | ICD-10-CM | POA: Diagnosis not present

## 2020-08-28 DIAGNOSIS — E663 Overweight: Secondary | ICD-10-CM

## 2020-08-28 DIAGNOSIS — T50B95A Adverse effect of other viral vaccines, initial encounter: Secondary | ICD-10-CM

## 2020-08-28 DIAGNOSIS — I10 Essential (primary) hypertension: Secondary | ICD-10-CM

## 2020-08-28 DIAGNOSIS — N528 Other male erectile dysfunction: Secondary | ICD-10-CM | POA: Diagnosis not present

## 2020-08-28 DIAGNOSIS — Z79899 Other long term (current) drug therapy: Secondary | ICD-10-CM | POA: Diagnosis not present

## 2020-08-28 DIAGNOSIS — R350 Frequency of micturition: Secondary | ICD-10-CM | POA: Diagnosis not present

## 2020-08-28 DIAGNOSIS — N5201 Erectile dysfunction due to arterial insufficiency: Secondary | ICD-10-CM | POA: Diagnosis not present

## 2020-08-28 NOTE — Patient Instructions (Signed)
Goals    . Exercise 150 min/wk Moderate Activity          Creatine Kinase Test Why am I having this test? The creatine kinase (CK) test is done to check for damage to muscle tissue in the body. When muscles are damaged, they release CK into the bloodstream.  This test can be used to help diagnose a heart attack or diseases of the skeletal muscles, brain, or spinal cord. What is being tested? The creatine kinase test may measure the following:  The total amount of CK in your blood (total CK).  The amount of three different forms of CK (isoenzymes) in the blood: ? CK-MM, which is found in your skeletal muscles and heart. ? CK-MB, which is found mostly in your heart. ? CK-BB, which is found mostly in your brain. What kind of sample is taken?  At least one blood sample is required for this test. It is usually collected by inserting a needle into a blood vessel. In some cases, you may need to have blood samples taken at regular intervals for up to 1 week. Tell a health care provider about:  All medicines you are taking, including vitamins, herbs, eye drops, creams, and over-the-counter medicines.  Any blood disorders you have.  Any surgeries you have had.  Any medical conditions you have.  Whether you are pregnant or may be pregnant. How are the results reported? Your results will be reported as values that indicate:  How much total CK is in your blood, given as units per liter (units/L).  How much of each measured isoenzyme is in your blood, given as a percentage. Your health care provider will compare your results to normal ranges that were established after testing a large group of people (reference values). Reference values may vary among labs and hospitals. For total CK, common reference values are ranges that vary by age:  Adult or elderly (values are higher after exercise): ? Male: 55-170 units/L. ? Male: 30-135 units/L.  Newborn: 68-580 units/L. Reference values  for each isoenzyme are:  CK-MM: 100%.  CK-MB: 0%.  CK-BB: 0%. What do the results mean? Results within reference ranges and values are normal. Levels of total CK that are higher than the reference ranges may mean that you have an injury or a disease affecting your heart, skeletal muscles, or brain. High levels of CK-MM may mean that you have:  Certain conditions affecting the skeletal muscle. A variety of conditions can lead to breakdown of skeletal muscle (rhabdomyolysis).  A recent history of surgery or injury.  Conditions that cause convulsions. These are episodes of uncontrollable movement caused by sudden, intense tightening (contraction) of the muscles. High levels of CK-MB may mean that you have:  A recent history of heart attack.  Other conditions that cause injury to the heart muscle. High levels of CK-BB may be caused by:  Taking certain psychiatric medicines.  A disease that affects the brain and spinal cord (central nervous system).  Certain types of cancer.  Injury to the lungs. Talk with your health care provider about what your results mean. Questions to ask your health care provider Ask your health care provider, or the department that is doing the test:  When will my results be ready?  How will I get my results?  What are my treatment options?  What other tests do I need?  What are my next steps? Summary  The creatine kinase (CK) test is done to check for damage to muscle  tissue in the body.  This test can be used to help diagnose a heart attack or diseases of the skeletal muscles, brain, or spinal cord.  This test involves measuring total CK and three different forms of CK in the blood.  Talk to your health care provider about what your results may mean. This information is not intended to replace advice given to you by your health care provider. Make sure you discuss any questions you have with your health care provider. Document Revised:  04/16/2017 Document Reviewed: 04/16/2017 Elsevier Patient Education  [place].      Muscle Pain, Adult Muscle pain (myalgia) may be mild or severe. In most cases, the pain lasts only a short time and it goes away without treatment. It is normal to feel some muscle pain after starting a workout program. Muscles that have not been used often will be sore at first. Muscle pain may also be caused by many other things, including:  Overuse or muscle strain, especially if you are not in shape. This is the most common cause of muscle pain.  Injury.  Bruises.  Viruses, such as the flu.  Infectious diseases.  A chronic condition that causes muscle tenderness, fatigue, and headache (fibromyalgia).  A condition, such as lupus, in which the body's disease-fighting system attacks other organs in the body (autoimmune or rheumatologic diseases).  Certain drugs, including ACE inhibitors and statins. To diagnose the cause of your muscle pain, your health care provider will do a physical exam and ask questions about the pain and when it began. If you have not had muscle pain for very long, your health care provider may want to wait before doing much testing. If your muscle pain has lasted a long time, your health care provider may want to run tests right away. In some cases, this may include tests to rule out certain conditions or illnesses. Treatment for muscle pain depends on the cause. Home care is often enough to relieve muscle pain. Your health care provider may also prescribe anti-inflammatory medicine. Follow these instructions at home: Activity  If overuse is causing your muscle pain: ? Slow down your activities until the pain goes away. ? Do regular, gentle exercises if you are not usually active. ? Warm up before exercising. Stretch before and after exercising. This can help lower the risk of muscle pain.  Do not continue working out if the pain is very bad. Bad pain could mean  that you have injured a muscle. Managing pain and discomfort   If directed, apply ice to the sore muscle: ? Put ice in a plastic bag. ? Place a towel between your skin and the bag. ? Leave the ice on for 20 minutes, 2-3 times a day.  You may also alternate between applying ice and applying heat as told by your health care provider. To apply heat, use the heat source that your health care provider recommends, such as a moist heat pack or a heating pad. ? Place a towel between your skin and the heat source. ? Leave the heat on for 20-30 minutes. ? Remove the heat if your skin turns bright red. This is especially important if you are unable to feel pain, heat, or cold. You may have a greater risk of getting burned. Medicines  Take over-the-counter and prescription medicines only as told by your health care provider.  Do not drive or use heavy machinery while taking prescription pain medicine. Contact a health care provider if:  Your  muscle pain gets worse and medicines do not help.  You have muscle pain that lasts longer than 3 days.  You have a rash or fever along with muscle pain.  You have muscle pain after a tick bite.  You have muscle pain while working out, even though you are in good physical condition.  You have redness, soreness, or swelling along with muscle pain.  You have muscle pain after starting a new medicine or changing the dose of a medicine. Get help right away if:  You have trouble breathing.  You have trouble swallowing.  You have muscle pain along with a stiff neck, fever, and vomiting.  You have severe muscle weakness or cannot move part of your body. This information is not intended to replace advice given to you by your health care provider. Make sure you discuss any questions you have with your health care provider. Document Revised: 08/28/2017 Document Reviewed: 02/05/2016 Elsevier Patient Education  [place].

## 2020-08-29 ENCOUNTER — Other Ambulatory Visit: Payer: Self-pay | Admitting: Adult Health

## 2020-08-29 LAB — CBC WITH DIFFERENTIAL/PLATELET
Absolute Monocytes: 392 cells/uL (ref 200–950)
Basophils Absolute: 50 cells/uL (ref 0–200)
Basophils Relative: 1.1 %
Eosinophils Absolute: 122 cells/uL (ref 15–500)
Eosinophils Relative: 2.7 %
HCT: 39.5 % (ref 38.5–50.0)
Hemoglobin: 13.4 g/dL (ref 13.2–17.1)
Lymphs Abs: 981 cells/uL (ref 850–3900)
MCH: 30.4 pg (ref 27.0–33.0)
MCHC: 33.9 g/dL (ref 32.0–36.0)
MCV: 89.6 fL (ref 80.0–100.0)
MPV: 10.3 fL (ref 7.5–12.5)
Monocytes Relative: 8.7 %
Neutro Abs: 2957 cells/uL (ref 1500–7800)
Neutrophils Relative %: 65.7 %
Platelets: 211 10*3/uL (ref 140–400)
RBC: 4.41 10*6/uL (ref 4.20–5.80)
RDW: 12.1 % (ref 11.0–15.0)
Total Lymphocyte: 21.8 %
WBC: 4.5 10*3/uL (ref 3.8–10.8)

## 2020-08-29 LAB — COMPLETE METABOLIC PANEL WITH GFR
AG Ratio: 1.8 (calc) (ref 1.0–2.5)
ALT: 14 U/L (ref 9–46)
AST: 15 U/L (ref 10–35)
Albumin: 4.6 g/dL (ref 3.6–5.1)
Alkaline phosphatase (APISO): 71 U/L (ref 35–144)
BUN: 16 mg/dL (ref 7–25)
CO2: 26 mmol/L (ref 20–32)
Calcium: 9.8 mg/dL (ref 8.6–10.3)
Chloride: 99 mmol/L (ref 98–110)
Creat: 1.11 mg/dL (ref 0.70–1.18)
GFR, Est African American: 78 mL/min/{1.73_m2} (ref >=60)
GFR, Est Non African American: 67 mL/min/{1.73_m2} (ref >=60)
Globulin: 2.6 g/dL (calc) (ref 1.9–3.7)
Glucose, Bld: 102 mg/dL — ABNORMAL HIGH (ref 65–99)
Potassium: 4.3 mmol/L (ref 3.5–5.3)
Sodium: 134 mmol/L — ABNORMAL LOW (ref 135–146)
Total Bilirubin: 0.4 mg/dL (ref 0.2–1.2)
Total Protein: 7.2 g/dL (ref 6.1–8.1)

## 2020-08-29 LAB — LIPID PANEL
Cholesterol: 119 mg/dL (ref <200)
HDL: 49 mg/dL (ref >=40)
LDL Cholesterol (Calc): 54 mg/dL (calc)
Non-HDL Cholesterol (Calc): 70 mg/dL (calc) (ref <130)
Total CHOL/HDL Ratio: 2.4 (calc) (ref <5.0)
Triglycerides: 77 mg/dL (ref <150)

## 2020-08-29 LAB — CK: Total CK: 44 U/L (ref 44–196)

## 2020-08-29 LAB — TSH: TSH: 0.82 mIU/L (ref 0.40–4.50)

## 2020-08-29 LAB — MAGNESIUM: Magnesium: 1.8 mg/dL (ref 1.5–2.5)

## 2020-08-29 MED ORDER — COLESTIPOL HCL 1 G PO TABS
1.0000 g | ORAL_TABLET | Freq: Two times a day (BID) | ORAL | 0 refills | Status: DC
Start: 2020-08-29 — End: 2020-09-22

## 2020-08-29 MED ORDER — PREDNISONE 20 MG PO TABS: ORAL_TABLET | ORAL | 0 refills | Status: DC

## 2020-09-05 DIAGNOSIS — M5417 Radiculopathy, lumbosacral region: Secondary | ICD-10-CM | POA: Diagnosis not present

## 2020-09-07 ENCOUNTER — Ambulatory Visit: Payer: Medicare Other | Admitting: Adult Health Nurse Practitioner

## 2020-09-15 ENCOUNTER — Other Ambulatory Visit: Payer: Self-pay | Admitting: Adult Health

## 2020-09-15 ENCOUNTER — Ambulatory Visit: Payer: Medicare Other | Attending: Internal Medicine

## 2020-09-15 DIAGNOSIS — Z23 Encounter for immunization: Secondary | ICD-10-CM

## 2020-09-15 DIAGNOSIS — I10 Essential (primary) hypertension: Secondary | ICD-10-CM

## 2020-09-15 NOTE — Progress Notes (Signed)
   Covid-19 Vaccination Clinic  Name:  Arthur Garcia    MRN: [id number] DOB: 1950/10/29  09/15/2020  [name] was observed post Covid-19 immunization for 15 minutes without incident. He was provided with Vaccine Information Sheet and instruction to access the V-Safe system.   [name] was instructed to call 911 with any severe reactions post vaccine: Marland Kitchen Difficulty breathing  . Swelling of face and throat  . A fast heartbeat  . A bad rash all over body  . Dizziness and weakness   Immunizations Administered    Name Date Dose VIS Date Route   Pfizer COVID-19 Vaccine 09/15/2020 10:03 AM 0.3 mL 07/18/2020 Intramuscular   Manufacturer: [place]   Lot: [id number]   [place]: [id number]

## 2020-09-17 ENCOUNTER — Other Ambulatory Visit: Payer: Self-pay | Admitting: Internal Medicine

## 2020-09-17 DIAGNOSIS — N5201 Erectile dysfunction due to arterial insufficiency: Secondary | ICD-10-CM | POA: Diagnosis not present

## 2020-09-21 ENCOUNTER — Other Ambulatory Visit: Payer: Self-pay | Admitting: Adult Health

## 2020-09-27 ENCOUNTER — Other Ambulatory Visit: Payer: Self-pay

## 2020-09-27 DIAGNOSIS — I10 Essential (primary) hypertension: Secondary | ICD-10-CM

## 2020-09-27 MED ORDER — VALSARTAN 160 MG PO TABS: ORAL_TABLET | ORAL | 0 refills | Status: DC

## 2020-10-02 ENCOUNTER — Other Ambulatory Visit: Payer: Self-pay | Admitting: Adult Health

## 2020-10-02 MED ORDER — TRAZODONE HCL 100 MG PO TABS: 100.0000 mg | ORAL_TABLET | Freq: Every day | ORAL | 1 refills | Status: DC

## 2020-10-16 ENCOUNTER — Other Ambulatory Visit: Payer: Self-pay | Admitting: Internal Medicine

## 2020-10-16 MED ORDER — BENZONATATE 200 MG PO CAPS: ORAL_CAPSULE | ORAL | 1 refills | Status: DC

## 2020-10-16 MED ORDER — DEXAMETHASONE 4 MG PO TABS: ORAL_TABLET | ORAL | 0 refills | Status: DC

## 2020-10-16 MED ORDER — PROMETHAZINE-DM 6.25-15 MG/5ML PO SYRP: ORAL_SOLUTION | ORAL | 1 refills | Status: DC

## 2020-10-23 ENCOUNTER — Other Ambulatory Visit: Payer: Self-pay | Admitting: Internal Medicine

## 2020-11-26 ENCOUNTER — Encounter: Payer: Medicare Other | Admitting: Internal Medicine

## 2020-11-28 DIAGNOSIS — R350 Frequency of micturition: Secondary | ICD-10-CM | POA: Diagnosis not present

## 2020-11-28 DIAGNOSIS — N5201 Erectile dysfunction due to arterial insufficiency: Secondary | ICD-10-CM | POA: Diagnosis not present

## 2020-11-28 DIAGNOSIS — N401 Enlarged prostate with lower urinary tract symptoms: Secondary | ICD-10-CM | POA: Diagnosis not present

## 2020-11-29 DIAGNOSIS — M5417 Radiculopathy, lumbosacral region: Secondary | ICD-10-CM | POA: Diagnosis not present

## 2020-11-29 DIAGNOSIS — M5126 Other intervertebral disc displacement, lumbar region: Secondary | ICD-10-CM | POA: Diagnosis not present

## 2020-11-29 DIAGNOSIS — M7138 Other bursal cyst, other site: Secondary | ICD-10-CM | POA: Diagnosis not present

## 2020-12-03 ENCOUNTER — Encounter: Payer: Self-pay | Admitting: Internal Medicine

## 2020-12-03 NOTE — Patient Instructions (Signed)

## 2020-12-03 NOTE — Progress Notes (Signed)
Comprehensive Evaluation & Examination      This very nice 70 y.o. MWM presents for a comprehensive evaluation and management of multiple medical co-morbidities.  Patient has been followed for HTN, HLD, Prediabetes and Vitamin D Deficiency.     Patient reports having all 3 Pfizer Covid-19 shots & reports since last vaccine in Dec 2021  That he has had extreme fatigue.      Patient has a long history of smoking over 40 pk years, I discussed lung cancer screening with him. He alleges quitting smoking May 08, 2020. Last year LD screening Lung CT scan (02/13/2020) was Negative. He was agreeable to undergo a screening low dose CT scan of the chest. We discussed smoking cessation techniques/options.  I will refer him her for a LDCT lung scan.         HTN predates circa 2001. Patient's BP has been controlled at home.  Today's BP: is at goal - 138/72. Patient denies any cardiac symptoms as chest pain, palpitations, shortness of breath, dizziness or ankle swelling.      Patient's hyperlipidemia is controlled with diet and Lipitor. Patient denies myalgias or other medication SE's. Last lipids were   Lab Results  Component Value Date   CHOL 119 08/28/2020   HDL 49 08/28/2020   LDLCALC 54 08/28/2020   TRIG 77 08/28/2020   CHOLHDL 2.4 08/28/2020       Patient has hx/o prediabetes (A1c 5.9%/2015)and patient denies reactive hypoglycemic symptoms, visual blurring, diabetic polys or paresthesias. Last A1c was not at goal:  Lab Results  Component Value Date   HGBA1C 6.0 (H) 05/22/2020         Finally, patient has history of Vitamin D Deficiency and last vitamin D was borderline elevated:   Lab Results  Component Value Date   VD25OH 108 (H) 05/22/2020    Current Outpatient Medications on File Prior to Visit  Medication Sig  . aspirin EC 81 MG tablet Take 81 mg by mouth daily.  Marland Kitchen atorvastatin (LIPITOR) 20 MG tablet TAKE 1 TABLET BY MOUTH EVERY DAY FOR CHOLESTEROL  . Cholecalciferol  (VITAMIN D-3) 5000 UNITS TABS Take 7,500 Units by mouth daily. Alternates between 5000 and 10000   . colestipol (COLESTID) 1 g tablet TAKE 1-2 TABLETS 2 X /DAY FOR POST-CHOLECYSTECTOMY DIARRHEA.  Marland Kitchen gabapentin (NEURONTIN) 300 MG capsule Take 300 mg by mouth 2 (two) times daily.   Marland Kitchen HYDROcodone-acetaminophen (NORCO/VICODIN) 5-325 MG tablet Take 1-2 tablets by mouth every 6 (six) hours as needed.  . traZODone (DESYREL) 100 MG tablet Take 1 tablet (100 mg total) by mouth at bedtime.  . valsartan (DIOVAN) 160 MG tablet Take 1 tablet Daily for BP     No Known Allergies  Past Medical History:  Diagnosis Date  . Abnormal liver function test   . Anxiety   . Colon polyp   . DDD (degenerative disc disease)   . ED (erectile dysfunction) 04/05/2014  . FHx: heart disease 01/28/2018  . GERD (gastroesophageal reflux disease)   . Hyperlipidemia   . Hypertension   . Insomnia   . Tobacco dependence     Health Maintenance  Topic Date Due  . Fecal DNA (Cologuard)  08/27/2020  . COVID-19 Vaccine (4 - Booster for Pfizer series) 03/16/2021  . TETANUS/TDAP  07/11/2027  . INFLUENZA VACCINE  Completed  . Hepatitis C Screening  Completed  . PNA vac Low Risk Adult  Completed  . HPV VACCINES  Aged Out    Immunization History  Administered Date(s) Administered  . Influenza Split 07/05/2014  . Influenza, High Dose Seasonal PF 08/21/2015, 08/09/2019, 08/28/2020  . PFIZER(Purple Top)SARS-COV-2 Vaccination 01/26/2020, 02/20/2020, 09/15/2020  . Pneumococcal Conjugate-13 05/20/2016  . Pneumococcal Polysaccharide-23 10/06/2012, 11/14/2019  . Td 09/29/2006, 07/10/2017  . Zoster 08/21/2015    Last Colon - 07/23/2007 - Dr Fuller Plan - Polyp - Recc 5 year f/u - patient declined Negative Cologard 08/27/2017 and f/u due Nov 2021  Past Surgical History:  Procedure Laterality Date  . ADENOIDECTOMY     age 18  . APPENDECTOMY    . DENTAL SURGERY     Dental implants  . LAPAROSCOPIC CHOLECYSTECTOMY  05/2020   At  surgery center, Dr. Kieth Brightly  . SPINE SURGERY  Aug 2016   Dr. Vertell Limber  . Uvuectomy    . VASECTOMY      Family History  Problem Relation Age of Onset  . Thyroid disease Mother   . Hypertension Mother   . Osteoporosis Mother   . Glaucoma Mother   . Thyroid disease Father   . Hypertension Father   . Hyperlipidemia Father   . Heart disease Father        CHF  . Heart disease Paternal Grandfather 4       Long standing "heart trouble"    Social History   Socioeconomic History  . Marital status: Married    Spouse name: Neoma Laming  . Number of children: 1 son & 1 daughter  Occupational History  . retired  Tobacco Use  . Smoking status: Former Smoker    Packs/day: 1.00    Years: 45.00    Pack years: 45.00    Types: Cigarettes    Quit date: 05/08/2020    Years since quitting: 0.5  . Smokeless tobacco: Never Used  . Tobacco comment: quit using chantix   Substance and Sexual Activity  . Alcohol use: Yes    Alcohol/week: 20.0 standard drinks    Types: 20 Cans of beer per week  . Drug use: No  . Sexual activity: Not on file  Social History Narrative   One grandchild.  Works on farm Engineer, structural.       ROS Constitutional: Denies fever, chills, weight loss/gain, headaches, insomnia,  night sweats or change in appetite. Does c/o fatigue. Eyes: Denies redness, blurred vision, diplopia, discharge, itchy or watery eyes.  ENT: Denies discharge, congestion, post nasal drip, epistaxis, sore throat, earache, hearing loss, dental pain, Tinnitus, Vertigo, Sinus pain or snoring.  Cardio: Denies chest pain, palpitations, irregular heartbeat, syncope, dyspnea, diaphoresis, orthopnea, PND, claudication or edema Respiratory: denies cough, dyspnea, DOE, pleurisy, hoarseness, laryngitis or wheezing.  Gastrointestinal: Denies dysphagia, heartburn, reflux, water brash, pain, cramps, nausea, vomiting, bloating, diarrhea, constipation, hematemesis, melena, hematochezia, jaundice or  hemorrhoids Genitourinary: Denies dysuria, frequency, urgency, nocturia, hesitancy, discharge, hematuria or flank pain Musculoskeletal: Denies arthralgia, myalgia, stiffness, Jt. Swelling, pain, limp or strain/sprain. Denies Falls. Skin: Denies puritis, rash, hives, warts, acne, eczema or change in skin lesion Neuro: No weakness, tremor, incoordination, spasms, paresthesia or pain Psychiatric: Denies confusion, memory loss or sensory loss. Denies Depression. Endocrine: Denies change in weight, skin, hair change, nocturia, and paresthesia, diabetic polys, visual blurring or hyper / hypo glycemic episodes.  Heme/Lymph: No excessive bleeding, bruising or enlarged lymph nodes.  Physical Exam  BP 138/72   Pulse 63   Temp (!) 97.2 F (36.2 C)   Resp 16   Ht 6\' 2"  (1.88 m)   Wt 212 lb 6.4 oz (96.3 kg)   SpO2  97%   BMI 27.27 kg/m   General Appearance: Well nourished and well groomed and in no apparent distress.  Eyes: PERRLA, EOMs, conjunctiva no swelling or erythema, normal fundi and vessels. Sinuses: No frontal/maxillary tenderness ENT/Mouth: EACs patent / TMs  nl. Nares clear without erythema, swelling, mucoid exudates. Oral hygiene is good. No erythema, swelling, or exudate. Tongue normal, non-obstructing. Tonsils not swollen or erythematous. Hearing normal.  Neck: Supple, thyroid not palpable. No bruits, nodes or JVD. Respiratory: Respiratory effort normal.  BS equal and clear bilateral without rales, rhonci, wheezing or stridor. Cardio: Heart sounds are normal with regular rate and rhythm and no murmurs, rubs or gallops. Peripheral pulses are normal and equal bilaterally without edema. No aortic or femoral bruits. Chest: symmetric with normal excursions and percussion.  Abdomen: Soft, with Nl bowel sounds. Nontender, no guarding, rebound, hernias, masses, or organomegaly.  Lymphatics: Non tender without lymphadenopathy.  Musculoskeletal: Full ROM all peripheral extremities, joint  stability, 5/5 strength, and normal gait. Skin: Warm and dry without rashes, lesions, cyanosis, clubbing or  ecchymosis.  Neuro: Cranial nerves intact, reflexes equal bilaterally. Normal muscle tone, no cerebellar symptoms. Sensation intact.  Pysch: Alert and oriented X 3 with normal affect, insight and judgment appropriate.   Assessment and Plan  1. Essential hypertension  - EKG 12-Lead - [place], RETROPERITNL ABD,  LTD - Urinalysis, Routine w reflex microscopic - Microalbumin / creatinine urine ratio - CBC with Differential/Platelet - COMPLETE METABOLIC PANEL WITH GFR - Magnesium - TSH  2. Hyperlipidemia, mixed  - EKG 12-Lead - [place], RETROPERITNL ABD,  LTD - Lipid panel - TSH  3. Abnormal glucose  - EKG 12-Lead - [place], RETROPERITNL ABD,  LTD - Hemoglobin A1c - Insulin, random  4. Vitamin D deficiency  - VITAMIN D 25 Hydroxy  5. Aortic atherosclerosis by Chest CT 02/13/2020  - EKG 12-Lead - [place], RETROPERITNL ABD,  LTD - Lipid panel  6. Prediabetes  - EKG 12-Lead - [place], RETROPERITNL ABD,  LTD - Hemoglobin A1c - Insulin, random  7. Benign localized prostatic hyperplasia with lower urinary tract symptoms (LUTS)  - PSA  8. Screening for colorectal cancer  - Cologuard  9. Prostate cancer screening  - PSA  10. Encounter for screening for lung cancer  - CT CHEST LUNG CA SCREEN LOW DOSE W/O CM  11. Encounter for screening for malignant neoplasm of respiratory organs  - CT CHEST LUNG CA SCREEN LOW DOSE W/O CM; Future  12. Screening for ischemic heart disease  - EKG 12-Lead  13. Former smoker   70. FHx: heart disease  - EKG 12-Lead - [place], RETROPERITNL ABD,  LTD  15. Screening for AAA (aortic abdominal aneurysm)  - [place], RETROPERITNL ABD,  LTD  16. Fatigue, unspecified type  - Vitamin B12 - Iron,Total/Total Iron Binding Cap - Ferritin  17. Iron deficiency  - Iron,Total/Total Iron Binding Cap - Ferritin  18. Vitamin B12 deficiency  - Vitamin  B12  19. Medication management  - Urinalysis, Routine w reflex microscopic - Microalbumin / creatinine urine ratio - CBC with Differential/Platelet - COMPLETE METABOLIC PANEL WITH GFR - Magnesium - Lipid panel - TSH - Hemoglobin A1c - Insulin, random - VITAMIN D 25 Hydroxy         Patient was counseled in prudent diet, weight control to achieve/maintain BMI less than 25, BP monitoring, regular exercise and medications as discussed.  Discussed med effects and SE's. Routine screening labs and tests as requested with regular follow-up as recommended. Over 70  minutes of exam, counseling, chart review and high complex critical decision making was performed   Kirtland Bouchard, MD

## 2020-12-04 ENCOUNTER — Ambulatory Visit (INDEPENDENT_AMBULATORY_CARE_PROVIDER_SITE_OTHER): Payer: Medicare Other | Admitting: Internal Medicine

## 2020-12-04 ENCOUNTER — Other Ambulatory Visit: Payer: Self-pay

## 2020-12-04 VITALS — BP 138/72 | HR 63 | Temp 97.2°F | Resp 16 | Ht 74.0 in | Wt 212.4 lb

## 2020-12-04 DIAGNOSIS — Z1212 Encounter for screening for malignant neoplasm of rectum: Secondary | ICD-10-CM

## 2020-12-04 DIAGNOSIS — I700 Atherosclerosis of aorta: Secondary | ICD-10-CM

## 2020-12-04 DIAGNOSIS — R7309 Other abnormal glucose: Secondary | ICD-10-CM

## 2020-12-04 DIAGNOSIS — Z1211 Encounter for screening for malignant neoplasm of colon: Secondary | ICD-10-CM

## 2020-12-04 DIAGNOSIS — Z8249 Family history of ischemic heart disease and other diseases of the circulatory system: Secondary | ICD-10-CM | POA: Diagnosis not present

## 2020-12-04 DIAGNOSIS — E538 Deficiency of other specified B group vitamins: Secondary | ICD-10-CM

## 2020-12-04 DIAGNOSIS — E611 Iron deficiency: Secondary | ICD-10-CM | POA: Diagnosis not present

## 2020-12-04 DIAGNOSIS — I10 Essential (primary) hypertension: Secondary | ICD-10-CM | POA: Diagnosis not present

## 2020-12-04 DIAGNOSIS — Z79899 Other long term (current) drug therapy: Secondary | ICD-10-CM

## 2020-12-04 DIAGNOSIS — N401 Enlarged prostate with lower urinary tract symptoms: Secondary | ICD-10-CM | POA: Diagnosis not present

## 2020-12-04 DIAGNOSIS — Z136 Encounter for screening for cardiovascular disorders: Secondary | ICD-10-CM

## 2020-12-04 DIAGNOSIS — Z125 Encounter for screening for malignant neoplasm of prostate: Secondary | ICD-10-CM | POA: Diagnosis not present

## 2020-12-04 DIAGNOSIS — Z87891 Personal history of nicotine dependence: Secondary | ICD-10-CM

## 2020-12-04 DIAGNOSIS — Z122 Encounter for screening for malignant neoplasm of respiratory organs: Secondary | ICD-10-CM

## 2020-12-04 DIAGNOSIS — E782 Mixed hyperlipidemia: Secondary | ICD-10-CM | POA: Diagnosis not present

## 2020-12-04 DIAGNOSIS — R7303 Prediabetes: Secondary | ICD-10-CM | POA: Diagnosis not present

## 2020-12-04 DIAGNOSIS — E559 Vitamin D deficiency, unspecified: Secondary | ICD-10-CM

## 2020-12-04 DIAGNOSIS — R5383 Other fatigue: Secondary | ICD-10-CM

## 2020-12-04 DIAGNOSIS — F17200 Nicotine dependence, unspecified, uncomplicated: Secondary | ICD-10-CM

## 2020-12-04 NOTE — Progress Notes (Signed)
AortaScan < 3 cm. Within normal limits, per Dr McKeown. 

## 2020-12-05 LAB — CBC WITH DIFFERENTIAL/PLATELET
Absolute Monocytes: 370 cells/uL (ref 200–950)
Basophils Absolute: 48 cells/uL (ref 0–200)
Basophils Relative: 1.1 %
Eosinophils Absolute: 229 cells/uL (ref 15–500)
Eosinophils Relative: 5.2 %
HCT: 37.3 % — ABNORMAL LOW (ref 38.5–50.0)
Hemoglobin: 12.6 g/dL — ABNORMAL LOW (ref 13.2–17.1)
Lymphs Abs: 986 cells/uL (ref 850–3900)
MCH: 30.6 pg (ref 27.0–33.0)
MCHC: 33.8 g/dL (ref 32.0–36.0)
MCV: 90.5 fL (ref 80.0–100.0)
MPV: 10.6 fL (ref 7.5–12.5)
Monocytes Relative: 8.4 %
Neutro Abs: 2768 cells/uL (ref 1500–7800)
Neutrophils Relative %: 62.9 %
Platelets: 203 10*3/uL (ref 140–400)
RBC: 4.12 10*6/uL — ABNORMAL LOW (ref 4.20–5.80)
RDW: 12.3 % (ref 11.0–15.0)
Total Lymphocyte: 22.4 %
WBC: 4.4 10*3/uL (ref 3.8–10.8)

## 2020-12-05 LAB — IRON, TOTAL/TOTAL IRON BINDING CAP
%SAT: 23 % (calc) (ref 20–48)
Iron: 63 ug/dL (ref 50–180)
TIBC: 275 mcg/dL (calc) (ref 250–425)

## 2020-12-05 LAB — LIPID PANEL
Cholesterol: 108 mg/dL (ref <200)
HDL: 45 mg/dL (ref >=40)
LDL Cholesterol (Calc): 51 mg/dL (calc)
Non-HDL Cholesterol (Calc): 63 mg/dL (calc) (ref <130)
Total CHOL/HDL Ratio: 2.4 (calc) (ref <5.0)
Triglycerides: 50 mg/dL (ref <150)

## 2020-12-05 LAB — MAGNESIUM: Magnesium: 1.8 mg/dL (ref 1.5–2.5)

## 2020-12-05 LAB — COMPLETE METABOLIC PANEL WITH GFR
AG Ratio: 1.8 (calc) (ref 1.0–2.5)
ALT: 11 U/L (ref 9–46)
AST: 14 U/L (ref 10–35)
Albumin: 4.4 g/dL (ref 3.6–5.1)
Alkaline phosphatase (APISO): 75 U/L (ref 35–144)
BUN/Creatinine Ratio: 12 (calc) (ref 6–22)
BUN: 15 mg/dL (ref 7–25)
CO2: 30 mmol/L (ref 20–32)
Calcium: 9.6 mg/dL (ref 8.6–10.3)
Chloride: 103 mmol/L (ref 98–110)
Creat: 1.22 mg/dL — ABNORMAL HIGH (ref 0.70–1.18)
GFR, Est African American: 69 mL/min/{1.73_m2} (ref >=60)
GFR, Est Non African American: 60 mL/min/{1.73_m2} (ref >=60)
Globulin: 2.5 g/dL (calc) (ref 1.9–3.7)
Glucose, Bld: 104 mg/dL — ABNORMAL HIGH (ref 65–99)
Potassium: 4.4 mmol/L (ref 3.5–5.3)
Sodium: 139 mmol/L (ref 135–146)
Total Bilirubin: 0.6 mg/dL (ref 0.2–1.2)
Total Protein: 6.9 g/dL (ref 6.1–8.1)

## 2020-12-05 LAB — HEMOGLOBIN A1C
Hgb A1c MFr Bld: 5.5 % of total Hgb (ref <5.7)
Mean Plasma Glucose: 111 mg/dL
eAG (mmol/L): 6.2 mmol/L

## 2020-12-05 LAB — URINALYSIS, ROUTINE W REFLEX MICROSCOPIC
Bilirubin Urine: NEGATIVE
Glucose, UA: NEGATIVE
Hgb urine dipstick: NEGATIVE
Ketones, ur: NEGATIVE
Leukocytes,Ua: NEGATIVE
Nitrite: NEGATIVE
Protein, ur: NEGATIVE
Specific Gravity, Urine: 1.01 (ref 1.001–1.03)
pH: <=5 (ref 5.0–8.0)

## 2020-12-05 LAB — INSULIN, RANDOM: Insulin: 5.1 u[IU]/mL

## 2020-12-05 LAB — MICROALBUMIN / CREATININE URINE RATIO
Creatinine, Urine: 72 mg/dL (ref 20–320)
Microalb Creat Ratio: 3 mcg/mg creat (ref <30)
Microalb, Ur: 0.2 mg/dL

## 2020-12-05 LAB — TSH: TSH: 1.31 mIU/L (ref 0.40–4.50)

## 2020-12-05 LAB — PSA: PSA: 2.76 ng/mL (ref <=4.0)

## 2020-12-05 LAB — VITAMIN B12: Vitamin B-12: 243 pg/mL (ref 200–1100)

## 2020-12-05 LAB — VITAMIN D 25 HYDROXY (VIT D DEFICIENCY, FRACTURES): Vit D, 25-Hydroxy: 106 ng/mL — ABNORMAL HIGH (ref 30–100)

## 2020-12-05 LAB — FERRITIN: Ferritin: 174 ng/mL (ref 24–380)

## 2020-12-05 NOTE — Progress Notes (Signed)
============================================================ -   Test results slightly outside the reference range are not unusual. If there is anything important, I will review this with you,  otherwise it is considered normal test values.  If you have further questions,  please do not hesitate to contact me at the office or via My Chart.  ============================================================ ============================================================  -  PSA - Low - Great  ============================================================ ============================================================  -   Magnesium  -   1.8   -  very  low- goal is betw 2.0 - 2.5,   - So..............Marland Kitchen  Recommend that you take  Magnesium 500 mg tablet  x 2 tablets /day   - also important to eat lots of  leafy green vegetables   - spinach - Kale - collards - greens - okra - asparagus  - broccoli - quinoa - squash - almonds   - black, red, white beans  -  peas - green beans ============================================================ ============================================================  -  Total Chol = 108    and         LDL Chol = 51      -  Both  Excellent   - Very low risk for Heart Attack  / Stroke ========================================================  -  A1c = 5.5% - and Back down in Normal  nonDiabetic range   -  Wonderful  !  ============================================================ ============================================================  -  Vitamin D = 106    -     Great    - Keep dose same  ============================================================ ============================================================  -  Iron levels All returned - Normal & OK ,   - But   -  Vitamin B12 =   243   Very Low  (Ideal or Goal Vit B12 is between 450 - 1,100)   Low Vit B12 may be associated with Anemia , Fatigue,   Peripheral Neuropathy, Dementia, "Brain Fog", &  Depression  - Recommend take a sub-lingual form of Vitamin B12 tablet   1,000 to 5,000 mcg tab that you dissolve under your tongue /Daily   - Can get Baron Sane - best price at [employer] or on [employer]  ============================================================ ============================================================  -  All Else - CBC - Kidneys - U/A - Electrolytes - Liver  & Thyroid    - all  Normal / OK ============================================================ ============================================================  - Keep up the [place] Work    !   ============================================================  ============================================================

## 2020-12-09 ENCOUNTER — Other Ambulatory Visit: Payer: Self-pay | Admitting: Adult Health Nurse Practitioner

## 2020-12-22 ENCOUNTER — Other Ambulatory Visit: Payer: Self-pay | Admitting: Adult Health

## 2020-12-22 DIAGNOSIS — I10 Essential (primary) hypertension: Secondary | ICD-10-CM

## 2021-01-17 DIAGNOSIS — L905 Scar conditions and fibrosis of skin: Secondary | ICD-10-CM | POA: Diagnosis not present

## 2021-01-17 DIAGNOSIS — L821 Other seborrheic keratosis: Secondary | ICD-10-CM | POA: Diagnosis not present

## 2021-01-17 DIAGNOSIS — L814 Other melanin hyperpigmentation: Secondary | ICD-10-CM | POA: Diagnosis not present

## 2021-01-17 DIAGNOSIS — Z85828 Personal history of other malignant neoplasm of skin: Secondary | ICD-10-CM | POA: Diagnosis not present

## 2021-01-17 DIAGNOSIS — L570 Actinic keratosis: Secondary | ICD-10-CM | POA: Diagnosis not present

## 2021-01-17 DIAGNOSIS — D225 Melanocytic nevi of trunk: Secondary | ICD-10-CM | POA: Diagnosis not present

## 2021-02-03 NOTE — Progress Notes (Signed)
    Future Appointments  Date Time Provider [place]  02/04/2021 11:00 AM Unk Pinto, MD [facility] None  02/13/2021  9:20 AM [facility] 1 [facility]  07/04/2021  9:30 AM Liane Comber, NP [facility] None  12/11/2021 11:00 AM Unk Pinto, MD [facility] None    History of Present Illness:     Patient is a very nice 70 yo MWM with hx/o spinal surg in 2016 by Dr Vertell Limber who is followed at his office by pain management for chronic LBP on g Vicodin 5/300 bid & also takingTylenol 1,000 mg bid. He described the pain recently at max with standing & walking and pain bilateral LBP w/ associated aching pains in the buttocks & postero/lateral thighs.  Medications   Current Outpatient Medications (Cardiovascular):  .  atorvastatin (LIPITOR) 20 MG tablet, TAKE 1 TABLET BY MOUTH EVERY DAY FOR CHOLESTEROL .  colestipol (COLESTID) 1 g tablet, TAKE 1-2 TABLETS 2 X /DAY FOR POST-CHOLECYSTECTOMY DIARRHEA. Marland Kitchen  valsartan (DIOVAN) 160 MG tablet, TAKE 1 TABLET DAILY FOR BLOOD PRESSURE   Current Outpatient Medications (Analgesics):  .  aspirin EC 81 MG tablet, Take 81 mg by mouth daily. Marland Kitchen  HYDROcodone-acetaminophen (NORCO/VICODIN) 5-325 MG tablet, Take 1-2 tablets by mouth every 6 (six) hours as needed.   Current Outpatient Medications (Other):  Marland Kitchen  Cholecalciferol (VITAMIN D-3) 5000 UNITS TABS, Take 7,500 Units by mouth daily. Alternates between 5000 and 10000  .  gabapentin (NEURONTIN) 300 MG capsule, Take 300 mg by mouth 2 (two) times daily.  .  traZODone (DESYREL) 100 MG tablet, Take 1 tablet (100 mg total) by mouth at bedtime.  Problem list He has Hyperlipidemia, mixed; Former smoker (45 pack year, quit 04/2020); ED (erectile dysfunction); Right bundle branch block (RBBB) with left anterior fascicular block (LAFB); Essential hypertension; Vitamin D deficiency; Medication management; CKD (chronic kidney disease) stage 2, GFR 60-89 ml/min; Gastroesophageal reflux disease; Other  abnormal glucose; Overweight (BMI 25.0-29.9); Acute medial meniscus tear of right knee; Primary osteoarthritis of right knee; and Aortic atherosclerosis by Chest CT 02/13/2020 on their problem list.   Observations/Objective:  BP 134/76   Pulse 76   Temp 97.9 F (36.6 C)   Resp 16   Ht 6\' 2"  (1.88 m)   Wt 213 lb (96.6 kg)   SpO2 95%   BMI 27.35 kg/m   HEENT - WNL. Neck - supple.  Chest - Clear equal BS. Cor - Nl HS. RRR w/o sig MGR. PP 1(+). No edema. MS- FROM w/o deformities.  Gait Nl. Neuro -  Nl w/o focal abnormalities.   Assessment and Plan:   1. Chronic bilateral low back pain with sciatica, sciatica laterality unspecified  - D/C Gabapentin   - New Rx -pregabalin (LYRICA) 50 MG capsule; Take 1 capsule  3 times daily.  Disp: 90 capsule; Refill: 2  - Patient has upcoming app't with pain mgmt at Dr Melven Sartorius office whether a lumbar MRI might be helpful to evaluate for Spinal stenosis.   Follow Up Instructions:        I discussed the assessment and treatment plan with the patient. The patient was provided an opportunity to ask questions and all were answered. The patient agreed with the plan and demonstrated an understanding of the instructions.        The patient was advised to call back or seek an in-person evaluation if the symptoms worsen or if the condition fails to improve as anticipated.   Kirtland Bouchard, MD

## 2021-02-04 ENCOUNTER — Ambulatory Visit (INDEPENDENT_AMBULATORY_CARE_PROVIDER_SITE_OTHER): Payer: Medicare Other | Admitting: Internal Medicine

## 2021-02-04 ENCOUNTER — Other Ambulatory Visit: Payer: Self-pay

## 2021-02-04 ENCOUNTER — Encounter: Payer: Self-pay | Admitting: Internal Medicine

## 2021-02-04 VITALS — BP 134/76 | HR 76 | Temp 97.9°F | Resp 16 | Ht 74.0 in | Wt 213.0 lb

## 2021-02-04 DIAGNOSIS — G8929 Other chronic pain: Secondary | ICD-10-CM

## 2021-02-04 DIAGNOSIS — M5440 Lumbago with sciatica, unspecified side: Secondary | ICD-10-CM

## 2021-02-04 MED ORDER — PREGABALIN 50 MG PO CAPS: 50.0000 mg | ORAL_CAPSULE | Freq: Three times a day (TID) | ORAL | 2 refills | Status: DC

## 2021-02-13 ENCOUNTER — Other Ambulatory Visit: Payer: Self-pay

## 2021-02-13 ENCOUNTER — Ambulatory Visit
Admission: RE | Admit: 2021-02-13 | Discharge: 2021-02-13 | Disposition: A | Discharge status: Home / Self Care | Payer: Medicare Other | Source: Ambulatory Visit | Attending: Internal Medicine | Admitting: Internal Medicine

## 2021-02-13 DIAGNOSIS — Z87891 Personal history of nicotine dependence: Secondary | ICD-10-CM | POA: Diagnosis not present

## 2021-02-13 DIAGNOSIS — I2510 Atherosclerotic heart disease of native coronary artery without angina pectoris: Secondary | ICD-10-CM | POA: Diagnosis not present

## 2021-02-13 DIAGNOSIS — J432 Centrilobular emphysema: Secondary | ICD-10-CM | POA: Diagnosis not present

## 2021-02-13 DIAGNOSIS — J984 Other disorders of lung: Secondary | ICD-10-CM | POA: Diagnosis not present

## 2021-02-13 DIAGNOSIS — Z122 Encounter for screening for malignant neoplasm of respiratory organs: Secondary | ICD-10-CM

## 2021-02-14 NOTE — Progress Notes (Signed)
============================================================ ============================================================  -    Chest CT scan - Great - No sign of Lung Cancer and                                           Radiologist recommends continues annual follow-up  ============================================================ ============================================================

## 2021-02-18 ENCOUNTER — Telehealth: Payer: Self-pay | Admitting: *Deleted

## 2021-02-18 NOTE — Telephone Encounter (Signed)
Faxed Pregabalin 50 mg capsule PA to [facility] (fax-[phone]). Approved 09/29/2020 to 02/18/2022.

## 2021-03-18 ENCOUNTER — Other Ambulatory Visit: Payer: Self-pay | Admitting: Adult Health

## 2021-06-13 ENCOUNTER — Other Ambulatory Visit: Payer: Self-pay | Admitting: Adult Health

## 2021-07-03 NOTE — Progress Notes (Signed)
MEDICARE ANNUAL WELLNESS VISIT AND FOLLOW UP Assessment:   Diagnoses and all orders for this visit:  Medicare annual wellness visit, subsequent Due annually  Cologuard ordered  Atherosclerosis of aorta Per CT 01/2020 Control blood pressure, cholesterol, glucose, increase exercise.   Essential hypertension Wanting to switch to Losartan for possible ED assistance Monitor blood pressure at home regularly; call if consistently over 130/80, Follow up in 1 month Continue DASH diet.   Reminder to go to the ER if any CP, SOB, nausea, dizziness, severe HA, changes vision/speech, left arm numbness and tingling and jaw pain.  Other male erectile dysfunction Treated by Cialis PRN; no concerns or complications Established with urology; follow up PRN  Hyperlipidemia, unspecified hyperlipidemia type Well controlled; continue statin medication Continue low cholesterol diet and exercise.  -     Lipid panel -     TSH  TOBACCO ABUSE/Emphysema noted on CT 01/2021 PT stop quitting smoking 04/2020 Will follow up at the next visit CT screening emphysema, neg 01/2021; repeat annually   Right bundle branch block (RBBB) with left anterior fascicular block (LAFB) Evaluated by cardiology; no further interventions recommended unless progresses and symptomatic Continue annual EKG and as indicated  Other abnormal glucose A1Cs recently well controlled; previously prediabetic Discussed disease and risks Discussed diet/exercise, weight management  -     CMP WITH GFR - A1c  Vitamin D deficiency At goal; continue supplementation Check vitamin D level   Medication management -     CBC with Differential/Platelet -     CMP/GFR -     Magnesium  - U/A routine with reflex microcopic - Microalbumin/creatinine urine ratio  CKD (chronic kidney disease) stage 2, GFR 60-89 ml/min Increase fluids, avoid NSAIDS, monitor sugars, will monitor  GERD Well managed off of medications at this time Discussed  diet, avoiding triggers and other lifestyle changes  Myalgias  CPK to rule out rhabdomyolysis since pt questioning statin. Since he has been on long term statin use I believe it unlikely cause  Pt will try muscle relaxants he has at home. If no improvement can consider PT and possible switch to Crestor  Need for flu Vaccine  High dose vaccine given  Over 30 minutes of exam, counseling, chart review, and critical decision making was performed  Future Appointments  Date Time Provider [place]  12/11/2021 11:00 AM Unk Pinto, MD [facility] None  07/04/2022  9:00 AM Magda Bernheim, NP [facility] None     Plan:   During the course of the visit the patient was educated and counseled about appropriate screening and preventive services including:   Pneumococcal vaccine  Influenza vaccine Prevnar 13 Td vaccine Screening electrocardiogram Colorectal cancer screening Diabetes screening Glaucoma screening Nutrition counseling    Subjective:  Arthur Garcia is a 70 y.o. male who presents for Medicare Annual Wellness Visit and 3 month follow up for HTN, hyperlipidemia, glucose management (hx of prediabetes), and vitamin D Def.   He has hx of DDD s/p lumbar discectomy x 2 by Dr. Vertell Limber in 2016 - he continues to experience pain and discomfort related to this, currently prescribed norco and neurontin and managed by pain management via Dr. Melven Sartorius office.   Pt quit smoking 04/2020. He has 30+ pack/yr smoking history and recently underwent low dose CT screening in 01/2021 which was negative other than aortic atherosclerosis, and emphysema  Pt having muscle pain in both hamstrings which is intermittent at rest and with walking.  Walking initially makes it worse  then helps to loosen it up. Uses Extra strength Tylenol. Has muscle relaxants at home but has not tried. First noticed after Covid booster. Questioning side effect of statin  Erectile dysfunction progressively worsening over  5-10 year period. Follows with Dr Abner Greenspan urologist. Interested impossibly changing Valsartan to Losartan to see if this helps symptoms.  BMI is Body mass index is 27.37 kg/m., he has been working on diet and exercise, no intentional exercise since dog passed away, working on [employer] upper home most days.  Wt Readings from Last 3 Encounters:  07/04/21 213 lb 3.2 oz (96.7 kg)  02/04/21 213 lb (96.6 kg)  12/04/20 212 lb 6.4 oz (96.3 kg)   He has not been checking BPs at home regularly, today their BP is BP: (!) 148/80  Bps at home are running 120-130/80's wife is a Therapist, sports and checks for him.  BP Readings from Last 3 Encounters:  07/04/21 (!) 148/80  02/04/21 134/76  12/04/20 138/72   He is taking valsartan 160mg  daily  He does workout. He denies chest pain, shortness of breath, dizziness.   He is on cholesterol medication (atorvastatin 20 mg daily) and denies myalgias. His cholesterol is at goal. The cholesterol last visit was:   Lab Results  Component Value Date   CHOL 108 12/04/2020   HDL 45 12/04/2020   LDLCALC 51 12/04/2020   TRIG 50 12/04/2020   CHOLHDL 2.4 12/04/2020   He has been working on diet and exercise for glucose management, and denies increased appetite, nausea, paresthesia of the feet, polydipsia, polyuria, visual disturbances and vomiting. Last A1C in the office was:  Lab Results  Component Value Date   HGBA1C 5.5 12/04/2020   Last GFR Lab Results  Component Value Date   GFRNONAA 60 12/04/2020    Patient is on Vitamin D supplement and at goal at most recent check:    Lab Results  Component Value Date   VD25OH 106 (H) 12/04/2020      Medication Review: Current Outpatient Medications on File Prior to Visit  Medication Sig Dispense Refill   aspirin EC 81 MG tablet Take 81 mg by mouth daily.     atorvastatin (LIPITOR) 20 MG tablet TAKE 1 TABLET BY MOUTH EVERY DAY FOR CHOLESTEROL 90 tablet 1   Cholecalciferol (VITAMIN D-3) 5000 UNITS TABS Take by mouth daily.  Alternates between 5000 and 10000     colestipol (COLESTID) 1 g tablet TAKE 1-2 TABLETS 2 X /DAY FOR POST-CHOLECYSTECTOMY DIARRHEA. (Patient taking differently: Take     1-2 tablets      2 x /day        for Post-Cholecystectomy Diarrhea.) 120 tablet 0   HYDROcodone-acetaminophen (NORCO/VICODIN) 5-325 MG tablet Take 1-2 tablets by mouth every 6 (six) hours as needed. 10 tablet 0   traZODone (DESYREL) 100 MG tablet Take  1 tablet  1 hour  before Bedtime  as needed for Sleep 90 tablet 3   No current facility-administered medications on file prior to visit.    Allergies: No Known Allergies  Current Problems (verified) has Hyperlipidemia, mixed; Former smoker (45 pack year, quit 04/2020); ED (erectile dysfunction); Right bundle branch block (RBBB) with left anterior fascicular block (LAFB); Essential hypertension; Vitamin D deficiency; Medication management; CKD (chronic kidney disease) stage 2, GFR 60-89 ml/min; Gastroesophageal reflux disease; Overweight (BMI 25.0-29.9); Acute medial meniscus tear of right knee; Primary osteoarthritis of right knee; and Aortic atherosclerosis by Chest CT 02/13/2020 on their problem list.  Screening Tests Immunization History  Administered  Date(s) Administered   Influenza Split 07/05/2014   Influenza, High Dose Seasonal PF 08/21/2015, 08/09/2019, 08/28/2020   PFIZER(Purple Top)SARS-COV-2 Vaccination 01/26/2020, 02/20/2020, 09/15/2020   Pneumococcal Conjugate-13 05/20/2016   Pneumococcal Polysaccharide-23 10/06/2012, 11/14/2019   Td 09/29/2006, 07/10/2017   Zoster, Live 08/21/2015   Preventative care: Last colonoscopy: 2008  Cologuard: 07/2017 neg due 07/2020, ordered today  Smoker - CT lung 02/13/2021, + aortic atherosclerosis and emphysema  Prior vaccinations: TD or Tdap: 2018  Influenza: 07/04/21 Pneumococcal: 2014, 2021 Prevnar13: 2017 Shingles/Zostavax: 2016 Covid 19: 1/2, 2021, pfizer has next scheduled on Monday   Names of Other  Physician/Practitioners you currently use: 1. [facility] here for primary care 2. Dr. Delman Cheadle, eye doctor, last visit 2022, goes q12 months, no issues  3. Dr. Lavone Neri, dentist, last visit 2022, goes q37m   Patient Care Team: Unk Pinto, MD as PCP - General (Internal Medicine) Erline Levine, MD as Consulting Physician (Neurosurgery) Ladene Artist, MD as Consulting Physician (Gastroenterology) Sharyne Peach, MD as Consulting Physician (Ophthalmology) Minus Breeding, MD as Consulting Physician (Cardiology) Druscilla Brownie, MD as Consulting Physician (Dermatology)  Surgical: He  has a past surgical history that includes Uvuectomy; Appendectomy; Vasectomy; Adenoidectomy; Spine surgery (Aug 2016); Dental surgery; and Laparoscopic cholecystectomy (05/2020). Family His family history includes Glaucoma in his mother; Heart disease in his father; Heart disease (age of onset: 59) in his paternal grandfather; Hyperlipidemia in his father; Hypertension in his father and mother; Osteoporosis in his mother; Thyroid disease in his father and mother. Social history  He reports that he quit smoking about 13 months ago. His smoking use included cigarettes. He has a 45.00 pack-year smoking history. He has never used smokeless tobacco. He reports current alcohol use of about 20.0 standard drinks per week. He reports that he does not use drugs.  MEDICARE WELLNESS OBJECTIVES: Physical activity: Current Exercise Habits: Home exercise routine, Type of exercise: walking, Time (Minutes): 25, Frequency (Times/Week): 7, Weekly Exercise (Minutes/Week): 175, Intensity: Mild, Exercise limited by: None identified Cardiac risk factors: Cardiac Risk Factors include: advanced age (>26men, >24 women);dyslipidemia;hypertension;male gender;smoking/ tobacco exposure Depression/mood screen:   Depression screen [facility] 2/9 07/04/2021  Decreased Interest 0  Down, Depressed, Hopeless 0  PHQ  - 2 Score 0    ADLs:  In your present state of health, do you have any difficulty performing the following activities: 07/04/2021 12/03/2020  Hearing? N N  Vision? N N  Difficulty concentrating or making decisions? N N  Walking or climbing stairs? N N  Dressing or bathing? N N  Doing errands, shopping? N N  Some recent data might be hidden     Cognitive Testing  Alert? Yes  Normal Appearance?Yes  Oriented to person? Yes  Place? Yes   Time? Yes  Recall of three objects?  Yes  Can perform simple calculations? Yes  Displays appropriate judgment?Yes  Can read the correct time from a watch face?Yes  EOL planning: Does Patient Have a Medical Advance Directive?: Yes Type of Advance Directive: Healthcare Power of Attorney, Living will Does patient want to make changes to medical advance directive?: No - Patient declined Copy of [place] in Chart?: No - copy requested  Review of Systems  Constitutional:  Negative for chills, fever and weight loss.  HENT:  Negative for congestion and hearing loss.   Eyes:  Negative for blurred vision and double vision.  Respiratory:  Negative for cough and shortness of breath.   Cardiovascular:  Negative for chest  pain, palpitations, orthopnea and leg swelling.  Gastrointestinal:  Negative for abdominal pain, constipation, diarrhea, heartburn, nausea and vomiting.  Genitourinary:  Positive for dysuria.       ED worsening  Musculoskeletal:  Positive for back pain and myalgias (hamstrings bilaterally). Negative for falls and joint pain.  Skin:  Negative for rash.  Neurological:  Negative for dizziness, tingling, tremors, loss of consciousness and headaches.  Endo/Heme/Allergies:  Does not bruise/bleed easily.  Psychiatric/Behavioral:  Negative for depression, memory loss and suicidal ideas.    Objective:   Today's Vitals   07/04/21 0934  BP: (!) 148/80  Pulse: 70  Temp: 97.7 F (36.5 C)  SpO2: 97%  Weight: 213 lb 3.2 oz (96.7  kg)    Body mass index is 27.37 kg/m.  General Appearance: Well nourished, in no apparent distress. Eyes: PERRLA, EOMs, conjunctiva no swelling or erythema Sinuses: No Frontal/maxillary tenderness ENT/Mouth: Ext aud canals clear, TMs without erythema, bulging. No erythema, swelling, or exudate on post pharynx.  Tonsils not swollen or erythematous. Hearing normal.  Neck: Supple, thyroid normal.  Respiratory: Respiratory effort normal, BS equal bilaterally without rales, rhonchi, wheezing or stridor.  Cardio: RRR with no MRGs. Brisk peripheral pulses without edema.  Abdomen: Soft, + BS.  Non tender, no guarding, rebound, hernias, masses. Lymphatics: Non tender without lymphadenopathy.  Musculoskeletal: Full ROM, 5/5 strength, Normal gait Skin: Warm, dry without rashes, lesions, ecchymosis.  Neuro: Cranial nerves intact. No cerebellar symptoms.  Psych: Awake and oriented X 3, normal affect, Insight and Judgment appropriate.    Medicare Attestation I have personally reviewed: The patient's medical and social history Their use of alcohol, tobacco or illicit drugs Their current medications and supplements The patient's functional ability including ADLs,fall risks, home safety risks, cognitive, and hearing and visual impairment Diet and physical activities Evidence for depression or mood disorders  The patient's weight, height, BMI, and visual acuity have been recorded in the chart.  I have made referrals, counseling, and provided education to the patient based on review of the above and I have provided the patient with a written personalized care plan for preventive services.     Magda Bernheim ANP-C  [name]  07/04/2021

## 2021-07-04 ENCOUNTER — Other Ambulatory Visit: Payer: Self-pay

## 2021-07-04 ENCOUNTER — Ambulatory Visit (INDEPENDENT_AMBULATORY_CARE_PROVIDER_SITE_OTHER): Payer: Medicare Other | Admitting: Nurse Practitioner

## 2021-07-04 ENCOUNTER — Encounter: Payer: Self-pay | Admitting: Nurse Practitioner

## 2021-07-04 VITALS — BP 148/80 | HR 70 | Temp 97.7°F | Wt 213.2 lb

## 2021-07-04 DIAGNOSIS — R6889 Other general symptoms and signs: Secondary | ICD-10-CM

## 2021-07-04 DIAGNOSIS — I10 Essential (primary) hypertension: Secondary | ICD-10-CM | POA: Diagnosis not present

## 2021-07-04 DIAGNOSIS — Z79899 Other long term (current) drug therapy: Secondary | ICD-10-CM

## 2021-07-04 DIAGNOSIS — I452 Bifascicular block: Secondary | ICD-10-CM | POA: Diagnosis not present

## 2021-07-04 DIAGNOSIS — Z720 Tobacco use: Secondary | ICD-10-CM | POA: Diagnosis not present

## 2021-07-04 DIAGNOSIS — Z0001 Encounter for general adult medical examination with abnormal findings: Secondary | ICD-10-CM | POA: Diagnosis not present

## 2021-07-04 DIAGNOSIS — I700 Atherosclerosis of aorta: Secondary | ICD-10-CM

## 2021-07-04 DIAGNOSIS — Z0000 Encounter for general adult medical examination without abnormal findings: Secondary | ICD-10-CM

## 2021-07-04 DIAGNOSIS — E559 Vitamin D deficiency, unspecified: Secondary | ICD-10-CM | POA: Diagnosis not present

## 2021-07-04 DIAGNOSIS — E782 Mixed hyperlipidemia: Secondary | ICD-10-CM | POA: Diagnosis not present

## 2021-07-04 DIAGNOSIS — Z1211 Encounter for screening for malignant neoplasm of colon: Secondary | ICD-10-CM

## 2021-07-04 DIAGNOSIS — M791 Myalgia, unspecified site: Secondary | ICD-10-CM | POA: Diagnosis not present

## 2021-07-04 DIAGNOSIS — R7309 Other abnormal glucose: Secondary | ICD-10-CM

## 2021-07-04 DIAGNOSIS — N182 Chronic kidney disease, stage 2 (mild): Secondary | ICD-10-CM | POA: Diagnosis not present

## 2021-07-04 DIAGNOSIS — Z23 Encounter for immunization: Secondary | ICD-10-CM | POA: Diagnosis not present

## 2021-07-04 DIAGNOSIS — K219 Gastro-esophageal reflux disease without esophagitis: Secondary | ICD-10-CM

## 2021-07-04 DIAGNOSIS — J438 Other emphysema: Secondary | ICD-10-CM

## 2021-07-04 DIAGNOSIS — N528 Other male erectile dysfunction: Secondary | ICD-10-CM | POA: Diagnosis not present

## 2021-07-04 MED ORDER — LOSARTAN POTASSIUM 50 MG PO TABS: 50.0000 mg | ORAL_TABLET | Freq: Every day | ORAL | 2 refills | Status: DC

## 2021-07-04 NOTE — Patient Instructions (Addendum)
GENERAL HEALTH GOALS   Know what a healthy weight is for you (roughly BMI <25) and aim to maintain this   Aim for 7+ servings of fruits and vegetables daily   70-80+ fluid ounces of water or unsweet tea for healthy kidneys   Limit to max 1 drink of alcohol per day; avoid smoking/tobacco   Limit animal fats in diet for cholesterol and heart health - choose grass fed whenever available   Avoid highly processed foods, and foods high in saturated/trans fats   Aim for low stress - take time to unwind and care for your mental health   Aim for 150 min of moderate intensity exercise weekly for heart health, and weights twice weekly for bone health   Aim for 7-9 hours of sleep daily   Losartan Tablets What is this medication? LOSARTAN (loe SAR tan) treats high blood pressure. It may also be used to prevent a stroke in people with heart disease and high blood pressure. It can be used to prevent kidney damage in people with diabetes. It works by relaxing the blood vessels, which helps decrease the amount of work your heart has to do. It belongs to a group of medications called ARBs. This medicine may be used for other purposes; ask your health care provider or pharmacist if you have questions. COMMON BRAND NAME(S): Cozaar What should I tell my care team before I take this medication? They need to know if you have any of these conditions: Heart failure Kidney disease Liver disease An unusual or allergic reaction to losartan, other medications, foods, dyes, or preservatives Pregnant or trying to get pregnant Breast-feeding How should I use this medication? Take this medication by mouth. Take it as directed on the prescription label at the same time every day. You can take it with or without food. If it upsets your stomach, take it with food. Keep taking it unless your care team tells you to stop. Talk to your care team about the use of this medication in children. While it may be prescribed for  children as young as 6 for selected conditions, precautions do apply. Overdosage: If you think you have taken too much of this medicine contact a poison control center or emergency room at once. NOTE: This medicine is only for you. Do not share this medicine with others. What if I miss a dose? If you miss a dose, take it as soon as you can. If it is almost time for your next dose, take only that dose. Do not take double or extra doses. What may interact with this medication? Aliskiren ACE inhibitors, like enalapril or lisinopril Diuretics, especially amiloride, eplerenone, spironolactone, or triamterene Lithium NSAIDs, medications for pain and inflammation, like ibuprofen or naproxen Potassium salts or potassium supplements This list may not describe all possible interactions. Give your health care provider a list of all the medicines, herbs, non-prescription drugs, or dietary supplements you use. Also tell them if you smoke, drink alcohol, or use illegal drugs. Some items may interact with your medicine. What should I watch for while using this medication? Visit your care team for regular check ups. Check your blood pressure as directed. Ask your care team what your blood pressure should be. Also, find out when you should contact them. Do not treat yourself for coughs, colds, or pain while you are using this medication without asking your care team for advice. Some medications may increase your blood pressure. Women should inform their care team if they wish  to become pregnant or think they might be pregnant. There is a potential for serious side effects to an unborn child. Talk to your care team for more information. You may get drowsy or dizzy. Do not drive, use machinery, or do anything that needs mental alertness until you know how this medication affects you. Do not stand or sit up quickly, especially if you are an older patient. This reduces the risk of dizzy or fainting spells. Alcohol can  make you more drowsy and dizzy. Avoid alcoholic drinks. Avoid salt substitutes unless you are told otherwise by your care team. What side effects may I notice from receiving this medication? Side effects that you should report to your care team as soon as possible: Allergic reactions-skin rash, itching, hives, swelling of the face, lips, tongue, or throat High potassium level-muscle weakness, fast or irregular heartbeat Kidney injury-decrease in the amount of urine, swelling of the ankles, hands, or feet Low blood pressure-dizziness, feeling faint or lightheaded, blurry vision Side effects that usually do not require medical attention (report to your care team if they continue or are bothersome): Dizziness Headache Runny or stuffy nose This list may not describe all possible side effects. Call your doctor for medical advice about side effects. You may report side effects to FDA at 1-800-FDA-1088. Where should I keep my medication? Keep out of the reach of children and pets. Store at room temperature between 20 and 25 degrees C (68 and 77 degrees F). Protect from light. Keep the container tightly closed. Get rid of any unused medication after the expiration date. To get rid of medications that are no longer needed or have expired: Take the medication to a medication take-back program. Check with your pharmacy or law enforcement to find a location. If you cannot return the medication, check the label or package insert to see if the medication should be thrown out in the garbage or flushed down the toilet. If you are not sure, ask your care team. If it is safe to put in the trash, empty the medication out of the container. Mix the medication with cat litter, dirt, coffee grounds, or other unwanted substance. Seal the mixture in a bag or container. Put it in the trash. NOTE: This sheet is a summary. It may not cover all possible information. If you have questions about this medicine, talk to your  doctor, pharmacist, or health care provider.  2022 Elsevier/Gold Standard (2020-08-08 13:49:17)

## 2021-07-05 LAB — CBC WITH DIFFERENTIAL/PLATELET
Absolute Monocytes: 435 cells/uL (ref 200–950)
Basophils Absolute: 41 cells/uL (ref 0–200)
Basophils Relative: 1 %
Eosinophils Absolute: 119 cells/uL (ref 15–500)
Eosinophils Relative: 2.9 %
HCT: 41.1 % (ref 38.5–50.0)
Hemoglobin: 13.7 g/dL (ref 13.2–17.1)
Lymphs Abs: 1078 cells/uL (ref 850–3900)
MCH: 30.6 pg (ref 27.0–33.0)
MCHC: 33.3 g/dL (ref 32.0–36.0)
MCV: 91.7 fL (ref 80.0–100.0)
MPV: 10.1 fL (ref 7.5–12.5)
Monocytes Relative: 10.6 %
Neutro Abs: 2427 cells/uL (ref 1500–7800)
Neutrophils Relative %: 59.2 %
Platelets: 199 10*3/uL (ref 140–400)
RBC: 4.48 10*6/uL (ref 4.20–5.80)
RDW: 12 % (ref 11.0–15.0)
Total Lymphocyte: 26.3 %
WBC: 4.1 10*3/uL (ref 3.8–10.8)

## 2021-07-05 LAB — COMPLETE METABOLIC PANEL WITH GFR
AG Ratio: 1.7 (calc) (ref 1.0–2.5)
ALT: 14 U/L (ref 9–46)
AST: 16 U/L (ref 10–35)
Albumin: 4.6 g/dL (ref 3.6–5.1)
Alkaline phosphatase (APISO): 73 U/L (ref 35–144)
BUN: 15 mg/dL (ref 7–25)
CO2: 28 mmol/L (ref 20–32)
Calcium: 9.8 mg/dL (ref 8.6–10.3)
Chloride: 102 mmol/L (ref 98–110)
Creat: 1.09 mg/dL (ref 0.70–1.28)
Globulin: 2.7 g/dL (calc) (ref 1.9–3.7)
Glucose, Bld: 105 mg/dL — ABNORMAL HIGH (ref 65–99)
Potassium: 4.4 mmol/L (ref 3.5–5.3)
Sodium: 139 mmol/L (ref 135–146)
Total Bilirubin: 0.8 mg/dL (ref 0.2–1.2)
Total Protein: 7.3 g/dL (ref 6.1–8.1)
eGFR: 73 mL/min/{1.73_m2} (ref >=60)

## 2021-07-05 LAB — MICROALBUMIN / CREATININE URINE RATIO
Creatinine, Urine: 48 mg/dL (ref 20–320)
Microalb, Ur: <0.2 mg/dL

## 2021-07-05 LAB — URINALYSIS, ROUTINE W REFLEX MICROSCOPIC
Bilirubin Urine: NEGATIVE
Glucose, UA: NEGATIVE
Hgb urine dipstick: NEGATIVE
Ketones, ur: NEGATIVE
Leukocytes,Ua: NEGATIVE
Nitrite: NEGATIVE
Protein, ur: NEGATIVE
Specific Gravity, Urine: 1.009 (ref 1.001–1.035)
pH: 5.5 (ref 5.0–8.0)

## 2021-07-05 LAB — HEMOGLOBIN A1C
Hgb A1c MFr Bld: 5.5 % of total Hgb (ref <5.7)
Mean Plasma Glucose: 111 mg/dL
eAG (mmol/L): 6.2 mmol/L

## 2021-07-05 LAB — LIPID PANEL
Cholesterol: 114 mg/dL (ref <200)
HDL: 43 mg/dL (ref >=40)
LDL Cholesterol (Calc): 56 mg/dL (calc)
Non-HDL Cholesterol (Calc): 71 mg/dL (calc) (ref <130)
Total CHOL/HDL Ratio: 2.7 (calc) (ref <5.0)
Triglycerides: 73 mg/dL (ref <150)

## 2021-07-05 LAB — CK: Total CK: 77 U/L (ref 44–196)

## 2021-07-05 LAB — VITAMIN D 25 HYDROXY (VIT D DEFICIENCY, FRACTURES): Vit D, 25-Hydroxy: 114 ng/mL — ABNORMAL HIGH (ref 30–100)

## 2021-07-05 LAB — MAGNESIUM: Magnesium: 1.8 mg/dL (ref 1.5–2.5)

## 2021-07-05 LAB — TSH: TSH: 1.04 mIU/L (ref 0.40–4.50)

## 2021-08-06 ENCOUNTER — Ambulatory Visit: Payer: Medicare Other | Admitting: Nurse Practitioner

## 2021-08-09 ENCOUNTER — Telehealth: Payer: Self-pay

## 2021-08-09 DIAGNOSIS — Z87891 Personal history of nicotine dependence: Secondary | ICD-10-CM | POA: Diagnosis not present

## 2021-08-09 DIAGNOSIS — E785 Hyperlipidemia, unspecified: Secondary | ICD-10-CM | POA: Diagnosis not present

## 2021-08-09 DIAGNOSIS — I10 Essential (primary) hypertension: Secondary | ICD-10-CM | POA: Diagnosis not present

## 2021-08-09 DIAGNOSIS — K644 Residual hemorrhoidal skin tags: Secondary | ICD-10-CM | POA: Diagnosis not present

## 2021-08-09 DIAGNOSIS — K649 Unspecified hemorrhoids: Secondary | ICD-10-CM | POA: Diagnosis not present

## 2021-08-09 NOTE — Telephone Encounter (Signed)
Patient's wife called to report that the patient had seven episodes of bright red blood with stool. Per wife she saw no fissures or external hemorrhoids and had no explanation for the patient's bloody stools. The patient's wife also explained that they are out of town in [facility]. She was advised to please take him to the local emergency department for evaluation and to call this office with any further questions or concerns. She voiced verbal understanding to this staff member.

## 2021-08-15 DIAGNOSIS — M5126 Other intervertebral disc displacement, lumbar region: Secondary | ICD-10-CM | POA: Diagnosis not present

## 2021-08-15 DIAGNOSIS — M5417 Radiculopathy, lumbosacral region: Secondary | ICD-10-CM | POA: Diagnosis not present

## 2021-08-15 DIAGNOSIS — M791 Myalgia, unspecified site: Secondary | ICD-10-CM | POA: Diagnosis not present

## 2021-08-19 NOTE — Progress Notes (Signed)
Assessment and Plan:  Arthur Garcia was seen today for follow-up.  Diagnoses and all orders for this visit:  Hypomagnesemia -     COMPLETE METABOLIC PANEL WITH GFR -     Magnesium Will await results to determine if supplementation is required. Continue to push fluids  Anemia, unspecified type -     CBC with Differential/Platelet  Will await results, if H/H is still low will refer for colonscopy  Hyponatremia -     COMPLETE METABOLIC PANEL WITH GFR Will await results and treat appropriately. Push fluids continue regular diet, do not restrict salt,  Colon cancer screening -     Cologuard Was ordered at his last visit but he never received. Reordered. Advised to monitor for any further rectal bleeding and advised to call office if occurs and will refer to GI.  Essential hypertension  - continue medications, DASH diet, exercise and monitor at home. Call if greater than 140/90.   Go to the ER if any chest pain, shortness of breath, nausea, dizziness, severe HA, changes vision/speech    Further disposition pending results of labs. Discussed med's effects and SE's.   [name], counseling, chart review, and critical decision making was performed.   Future Appointments  Date Time Provider [place]  12/11/2021 11:00 AM Unk Pinto, MD [facility] None  07/04/2022  9:00 AM Magda Bernheim, NP [facility] None    ------------------------------------------------------------------------------------------------------------------   HPI BP (!) 150/74   Pulse 89   Temp 97.9 F (36.6 C)   Wt 216 lb 6.4 oz (98.2 kg)   SpO2 96%   BMI 27.78 kg/m  70 y.o.male presents for Blood pressure  He had 6 episodes of diarrhea with blood noted 08/09/21, seen in the ER.  Was noted to have an external hemorrhoid. Magnesium, sodium and Hemoglobin and hematocrit were low and require reevaluation.He has had nor further episodes of rectal bleeding since that day. Hemoglobin was 11 when  checked in ER, our most recent value was: Lab Results  Component Value Date   WBC 4.1 07/04/2021   HGB 13.7 07/04/2021   HCT 41.1 07/04/2021   MCV 91.7 07/04/2021   PLT 199 07/04/2021    He was switched from Diovan to Losartan to determine if it could help with sexual side effects. He has not noticed any difference in ED problems. BP Readings from Last 3 Encounters:  08/20/21 (!) 150/74  07/04/21 (!) 148/80  02/04/21 134/76    At home BP's are running 120-130's/70's. Wife who is a nurse is taking his BP. Denies headaches, chest pain, shortness of breath.   Continues to have pain in the hamstring area which he describes as aching pain in the back of his legs  Has chronic back pain which is now controlled with Tramadol.  States this pain is not helped at all with Tramadol. Was given Robaxin at pain management to try but has not started taking any because he wanted to talk to [place].  Past Medical History:  Diagnosis Date   Abnormal liver function test    Anxiety    Colon polyp    DDD (degenerative disc disease)    ED (erectile dysfunction) 04/05/2014   FHx: heart disease 01/28/2018   GERD (gastroesophageal reflux disease)    Hyperlipidemia    Hypertension    Insomnia    Tobacco dependence      No Known Allergies  Current Outpatient Medications on File Prior to Visit  Medication Sig   aspirin EC  81 MG tablet Take 81 mg by mouth daily.   atorvastatin (LIPITOR) 20 MG tablet TAKE 1 TABLET BY MOUTH EVERY DAY FOR CHOLESTEROL   Cholecalciferol (VITAMIN D-3) 5000 UNITS TABS Take by mouth daily. Alternates between 5000 and 10000   losartan (COZAAR) 50 MG tablet Take 1 tablet (50 mg total) by mouth daily.   traMADol (ULTRAM) 50 MG tablet Take by mouth every 6 (six) hours as needed.   traZODone (DESYREL) 100 MG tablet Take  1 tablet  1 hour  before Bedtime  as needed for Sleep   colestipol (COLESTID) 1 g tablet TAKE 1-2 TABLETS 2 X /DAY FOR POST-CHOLECYSTECTOMY DIARRHEA. (Patient not taking:  Reported on 08/20/2021)   HYDROcodone-acetaminophen (NORCO/VICODIN) 5-325 MG tablet Take 1-2 tablets by mouth every 6 (six) hours as needed. (Patient not taking: Reported on 08/20/2021)   No current facility-administered medications on file prior to visit.    Review of Systems  Constitutional:  Negative for chills, fever, malaise/fatigue and weight loss.  HENT:  Negative for congestion, hearing loss and sinus pain.   Eyes:  Negative for blurred vision.  Respiratory:  Negative for cough, shortness of breath and wheezing.   Cardiovascular:  Negative for chest pain, palpitations and leg swelling.  Gastrointestinal:  Negative for abdominal pain, blood in stool, constipation and diarrhea.  Genitourinary:  Negative for dysuria.  Musculoskeletal:  Positive for back pain and myalgias.  Skin:  Negative for rash.  Neurological:  Negative for dizziness and headaches.  Endo/Heme/Allergies:  Does not bruise/bleed easily.  Psychiatric/Behavioral:  Negative for depression. The patient has insomnia.   Marland Kitchen   Physical Exam:  BP (!) 150/74   Pulse 89   Temp 97.9 F (36.6 C)   Wt 216 lb 6.4 oz (98.2 kg)   SpO2 96%   BMI 27.78 kg/m   General Appearance: Well nourished, in no apparent distress. Eyes: PERRLA, EOMs, conjunctiva no swelling or erythema Sinuses: No Frontal/maxillary tenderness ENT/Mouth: Ext aud canals clear, TMs without erythema, bulging. No erythema, swelling, or exudate on post pharynx.  Tonsils not swollen or erythematous. Hearing normal.  Neck: Supple, thyroid normal.  Respiratory: Respiratory effort normal, BS equal bilaterally without rales, rhonchi, wheezing or stridor.  Cardio: RRR with no MRGs. Brisk peripheral pulses without edema.  Abdomen: Soft, + BS.  Non tender, no guarding, rebound, hernias, masses. Lymphatics: Non tender without lymphadenopathy.  Musculoskeletal: Full ROM, 5/5 strength, normal gait.  Skin: Warm, dry without rashes, lesions, ecchymosis.  Neuro: Cranial  nerves intact. Normal muscle tone, no cerebellar symptoms. Sensation intact.  Psych: Awake and oriented X 3, normal affect, Insight and Judgment appropriate.     Magda Bernheim, NP 11:25 AM [name]

## 2021-08-20 ENCOUNTER — Encounter: Payer: Self-pay | Admitting: Nurse Practitioner

## 2021-08-20 ENCOUNTER — Ambulatory Visit (INDEPENDENT_AMBULATORY_CARE_PROVIDER_SITE_OTHER): Payer: Medicare Other | Admitting: Nurse Practitioner

## 2021-08-20 ENCOUNTER — Other Ambulatory Visit: Payer: Self-pay

## 2021-08-20 DIAGNOSIS — E871 Hypo-osmolality and hyponatremia: Secondary | ICD-10-CM

## 2021-08-20 DIAGNOSIS — D649 Anemia, unspecified: Secondary | ICD-10-CM | POA: Diagnosis not present

## 2021-08-20 DIAGNOSIS — Z1211 Encounter for screening for malignant neoplasm of colon: Secondary | ICD-10-CM

## 2021-08-20 DIAGNOSIS — I10 Essential (primary) hypertension: Secondary | ICD-10-CM

## 2021-08-21 LAB — CBC WITH DIFFERENTIAL/PLATELET
Absolute Monocytes: 389 cells/uL (ref 200–950)
Basophils Absolute: 48 cells/uL (ref 0–200)
Basophils Relative: 1 %
Eosinophils Absolute: 139 cells/uL (ref 15–500)
Eosinophils Relative: 2.9 %
HCT: 32.9 % — ABNORMAL LOW (ref 38.5–50.0)
Hemoglobin: 11.1 g/dL — ABNORMAL LOW (ref 13.2–17.1)
Lymphs Abs: 941 cells/uL (ref 850–3900)
MCH: 31.7 pg (ref 27.0–33.0)
MCHC: 33.7 g/dL (ref 32.0–36.0)
MCV: 94 fL (ref 80.0–100.0)
MPV: 10.3 fL (ref 7.5–12.5)
Monocytes Relative: 8.1 %
Neutro Abs: 3283 cells/uL (ref 1500–7800)
Neutrophils Relative %: 68.4 %
Platelets: 230 10*3/uL (ref 140–400)
RBC: 3.5 10*6/uL — ABNORMAL LOW (ref 4.20–5.80)
RDW: 12.2 % (ref 11.0–15.0)
Total Lymphocyte: 19.6 %
WBC: 4.8 10*3/uL (ref 3.8–10.8)

## 2021-08-21 LAB — COMPLETE METABOLIC PANEL WITH GFR
AG Ratio: 1.9 (calc) (ref 1.0–2.5)
ALT: 13 U/L (ref 9–46)
AST: 15 U/L (ref 10–35)
Albumin: 4.4 g/dL (ref 3.6–5.1)
Alkaline phosphatase (APISO): 64 U/L (ref 35–144)
BUN/Creatinine Ratio: 10 (calc) (ref 6–22)
BUN: 13 mg/dL (ref 7–25)
CO2: 28 mmol/L (ref 20–32)
Calcium: 9.8 mg/dL (ref 8.6–10.3)
Chloride: 103 mmol/L (ref 98–110)
Creat: 1.33 mg/dL — ABNORMAL HIGH (ref 0.70–1.28)
Globulin: 2.3 g/dL (calc) (ref 1.9–3.7)
Glucose, Bld: 97 mg/dL (ref 65–99)
Potassium: 4.1 mmol/L (ref 3.5–5.3)
Sodium: 139 mmol/L (ref 135–146)
Total Bilirubin: 0.3 mg/dL (ref 0.2–1.2)
Total Protein: 6.7 g/dL (ref 6.1–8.1)
eGFR: 57 mL/min/{1.73_m2} — ABNORMAL LOW (ref >=60)

## 2021-08-21 LAB — MAGNESIUM: Magnesium: 1.7 mg/dL (ref 1.5–2.5)

## 2021-09-07 DIAGNOSIS — Z1211 Encounter for screening for malignant neoplasm of colon: Secondary | ICD-10-CM | POA: Diagnosis not present

## 2021-09-14 LAB — COLOGUARD: COLOGUARD: NEGATIVE

## 2021-09-29 ENCOUNTER — Other Ambulatory Visit: Payer: Self-pay | Admitting: Nurse Practitioner

## 2021-09-29 DIAGNOSIS — I10 Essential (primary) hypertension: Secondary | ICD-10-CM

## 2021-10-01 NOTE — Progress Notes (Signed)
Future Appointments  Date Time Provider Department  10/02/2021  9:30 AM Unk Pinto, MD [facility]  12/11/2021 11:00 AM Unk Pinto, MD [facility]  07/04/2022  9:00 AM Magda Bernheim, NP [facility]    History of Present Illness:      Patient is a very nice 71  yo MWM     who presents with a  1-2 week prodrome of head & chest congestion. C/o HA, sinus  congestion     Medications    atorvastatin (20 MG tablet, TAKE 1 TABLET  EVERY DAY   losartan (50 MG tablet, Take 1 tablet  Daily  for BP   aspirin EC 81 MG tablet, Take  daily.   traMADol 50 MG tablet, Take  every 6 hours as needed.   VITAMIN D 5000 u, Takes daily. Alternates between 5000 and 10,000   traZODone 100 MG tablet, Take  1 tablet  1 hour  before Bedtime  as needed for Sleep  Problem list He has Hyperlipidemia, mixed; Former smoker (45 pack year, quit 04/2020); ED (erectile dysfunction); Right bundle branch block (RBBB) with left anterior fascicular block (LAFB); Essential hypertension; Vitamin D deficiency; Medication management; CKD (chronic kidney disease) stage 2, GFR 60-89 ml/min; Gastroesophageal reflux disease; Overweight (BMI 25.0-29.9); Acute medial meniscus tear of right knee; Primary osteoarthritis of right knee; and Aortic atherosclerosis by Chest CT 02/13/2020 on their problem list.   Observations/Objective:  BP (!) 148/88    Pulse 72    Temp 98 F (36.7 C)    Ht 6\' 2"  (1.88 m)    Wt 215 lb 12.8 oz (97.9 kg)    SpO2 95%    BMI 27.71 kg/m   No cyanosis, icterus, rash, or stridor.  HEENT  -    EACs - patent. TMs - clear dull  retracted   pink   Nl color.   (+) Rt Frontal & Maxillary tenderness N/O/P    -    Clear  injected  No exudate  Neck     -    Supple. No sig Cx LN's.    Chest    -    BS equal   with few scattered rale   rhonchi   wheezes  Cor - Nl HS. RRR w/o sig MGR. PP 1(+). No edema. MS- FROM w/o deformities.  Gait Nl. Neuro -  Nl w/o focal abnormalities.  Assessment and Plan:  1. Acute  frontal sinusitis, recurrence not specified  - dexamethasone 4 MG tablet;  Take 1 tab 3 x day - 3 days, then 2 x day - 3 days, then 1 tab daily   Dispense: 20 tablet  - azithromycin 250 MG tablet; Take 2 tablets with Food on  Day 1, then 1 tablet Daily   Dispense: 6 each; Refill: 1  - pseudoephedrine  120 MG 12 hr tablet;  Take  1 tablet  2 x /day (every 12 hours) n   Dispense: 60 tablet; Refill: 1  2. Acute maxillary sinusitis  - dexamethasone 4 MG tablet;  Take 1 tab 3 x day - 3 days, then 2 x day - 3 days, then 1 tab daily   Dispense: 20 tablet  - azithromycin 250 MG tablet; Take 2 tablets with Food on  Day 1, then 1 tablet Daily   Dispense: 6 each; Refill: 1  - pseudoephedrine  120 MG 12 hr tablet;  Take  1 tablet  2 x /day (every 12 hours)  Dispense: 60 tablet; Refill: 1  3. Cough headache  - POC Influenza A&B (Binax test)   ->> Negative - POC COVID-19  ->>  Negative    Follow Up Instructions:        I discussed the assessment and treatment plan with the patient. The patient was provided an opportunity to ask questions and all were answered. The patient agreed with the plan and demonstrated an understanding of the instructions.       The patient was advised to call back or seek an in-person evaluation if the symptoms worsen or if the condition fails to improve as anticipated.    Kirtland Bouchard, MD

## 2021-10-02 ENCOUNTER — Ambulatory Visit (INDEPENDENT_AMBULATORY_CARE_PROVIDER_SITE_OTHER): Payer: Medicare Other | Admitting: Internal Medicine

## 2021-10-02 ENCOUNTER — Other Ambulatory Visit: Payer: Self-pay

## 2021-10-02 ENCOUNTER — Encounter: Payer: Self-pay | Admitting: Internal Medicine

## 2021-10-02 VITALS — BP 148/88 | HR 72 | Temp 98.0°F | Ht 74.0 in | Wt 215.8 lb

## 2021-10-02 DIAGNOSIS — G4483 Primary cough headache: Secondary | ICD-10-CM

## 2021-10-02 DIAGNOSIS — J0100 Acute maxillary sinusitis, unspecified: Secondary | ICD-10-CM | POA: Diagnosis not present

## 2021-10-02 DIAGNOSIS — J0110 Acute frontal sinusitis, unspecified: Secondary | ICD-10-CM | POA: Diagnosis not present

## 2021-10-02 DIAGNOSIS — Z1152 Encounter for screening for COVID-19: Secondary | ICD-10-CM

## 2021-10-02 LAB — POC INFLUENZA A&B (BINAX/QUICKVUE)
Influenza A, POC: NEGATIVE
Influenza B, POC: NEGATIVE

## 2021-10-02 LAB — POC COVID19 BINAXNOW: SARS Coronavirus 2 Ag: NEGATIVE

## 2021-10-02 MED ORDER — PSEUDOEPHEDRINE HCL ER 120 MG PO TB12: ORAL_TABLET | ORAL | 1 refills | Status: DC

## 2021-10-02 MED ORDER — AZITHROMYCIN 250 MG PO TABS: ORAL_TABLET | ORAL | 1 refills | Status: DC

## 2021-10-02 MED ORDER — DEXAMETHASONE 4 MG PO TABS: ORAL_TABLET | ORAL | 0 refills | Status: DC

## 2021-10-15 DIAGNOSIS — M791 Myalgia, unspecified site: Secondary | ICD-10-CM | POA: Diagnosis not present

## 2021-10-15 DIAGNOSIS — M5417 Radiculopathy, lumbosacral region: Secondary | ICD-10-CM | POA: Diagnosis not present

## 2021-10-15 DIAGNOSIS — M5126 Other intervertebral disc displacement, lumbar region: Secondary | ICD-10-CM | POA: Diagnosis not present

## 2021-11-24 ENCOUNTER — Encounter: Payer: Self-pay | Admitting: Internal Medicine

## 2021-11-24 ENCOUNTER — Other Ambulatory Visit: Payer: Self-pay | Admitting: Internal Medicine

## 2021-11-24 DIAGNOSIS — R197 Diarrhea, unspecified: Secondary | ICD-10-CM

## 2021-11-24 DIAGNOSIS — K909 Intestinal malabsorption, unspecified: Secondary | ICD-10-CM

## 2021-11-24 MED ORDER — COLESTIPOL HCL 1 G PO TABS: ORAL_TABLET | ORAL | 3 refills | Status: DC

## 2021-12-10 ENCOUNTER — Encounter: Payer: Self-pay | Admitting: Internal Medicine

## 2021-12-10 NOTE — Patient Instructions (Signed)

## 2021-12-10 NOTE — Progress Notes (Signed)
? ?Comprehensive Evaluation & Examination ? ?Future Appointments  ?Date Time Provider Department  ?12/11/2021 11:00 AM Unk Pinto, MD [facility]  ?07/04/2022        Wellness   9:00 AM Magda Bernheim, NP [facility]  ?12/17/2022 11:00 AM Unk Pinto, MD [facility]  ? ?    ?     This very nice 71 y.o. MWM presents for a comprehensive evaluation and management of multiple medical co-morbidities.  Patient has been followed for HTN, HLD, T2_NIDDM  Prediabetes and Vitamin D Deficiency. Chest CT in 2021 showed Aortic Atherosclerosis.  ? ? ?                               Patient has a 40+pk year smoking history.  We discussed lung cancer screening.   He alleges quitting smoking May 08, 2020.  Last year LD screening Lung CT scan (02/13/2021) was Negative. He was agreeable to undergo a screening low dose CT scan of the chest.   I will refer for a LDCT lung scan. ?                        ? ?    HTN predates since 2001. Patient's BP has been controlled at home.  Today's BP is elevated at 160/88. Patient denies any cardiac symptoms as chest pain, palpitations, shortness of breath, dizziness or ankle swelling. ? ? ?    Patient's hyperlipidemia is controlled with diet and medications. Patient denies myalgias or other medication SE's. Last lipids were at goal : ? ?Lab Results  ?Component Value Date  ? CHOL 114 07/04/2021  ? HDL 43 07/04/2021  ? [place] 56 07/04/2021  ? TRIG 73 07/04/2021  ? CHOLHDL 2.7 07/04/2021  ? ? ? ?    Patient has hx/o prediabetes (A1c 5.9% /2015) and patient denies reactive hypoglycemic symptoms, visual blurring, diabetic polys or paresthesias. Last A1c was normal & at goal : ?  ?Lab Results  ?Component Value Date  ? HGBA1C 5.5 07/04/2021  ?  ? ? ?    Finally, patient has history of Vitamin D Deficiency of    and last vitamin D was slightly elevated & dose was tapered : ?  ?Lab Results  ?Component Value Date  ? VD25OH 114 (H) 07/04/2021  ? ? ? ?Current Outpatient Medications on File Prior to Visit   ?Medication Sig  ? aspirin EC 81 MG tablet Take daily.  ? atorvastatin (LIPITOR) 20 MG tablet TAKE 1 TABLET  EVERY DAY   ? VITAMIN D 2000 UNITS  Take daily  ? colestipol (COLESTID) 1 g tablet Take  1 to 2 tablets   2 x /day  post Cholecystectomy Diarrhea  ? losartan (COZAAR) 50 MG tablet Take 1 tablet  Daily  for BP  ? pseudoephedrine 120 MG 12 hr tablet Take  1 tablet  2 x /day (every 12 hours)    ? traMADol 50 MG tablet Take by mouth every 6  hours as needed.  ? traZODone 100 MG tablet Take  1 tab 1 hr  before Bedtime  as needed  ? ? ?No Known Allergies ? ? ?Past Medical History:  ?Diagnosis Date  ? Abnormal liver function test   ? Anxiety   ? Colon polyp   ? DDD (degenerative disc disease)   ? ED (erectile dysfunction) 04/05/2014  ? FHx: heart disease 01/28/2018  ? GERD (gastroesophageal reflux disease)   ?  Hyperlipidemia   ? Hypertension   ? Insomnia   ? Tobacco dependence   ? ? ? ?Health Maintenance  ?Topic Date Due  ? Zoster Vaccines- Shingrix (1 of 2) Never done  ? COVID-19 Vaccine (4 - Booster for Pfizer series) 11/10/2020  ? Fecal DNA (Cologuard)  09/07/2024  ? TETANUS/TDAP  07/11/2027  ? Pneumonia Vaccine 28+ Years old  Completed  ? INFLUENZA VACCINE  Completed  ? Hepatitis C Screening  Completed  ? HPV VACCINES  Aged Out  ? ? ? ?Immunization History  ?Administered Date(s) Administered  ? Influenza Split 07/05/2014  ? Influenza, High Dose 08/09/2019, 08/28/2020, 07/04/2021  ? PFIZER SARS-COV-2 Vacc 01/26/2020, 02/20/2020, 09/15/2020  ? Pneumococcal -13 05/20/2016  ? Pneumococcal -23 10/06/2012, 11/14/2019  ? Td 09/29/2006, 07/10/2017  ? Zoster, Live 08/21/2015  ? ? ? Cologard - 09/07/2021 - Neg due 3 year f/u Dec 2025 ? ? ?Past Surgical History:  ?Procedure Laterality Date  ? ADENOIDECTOMY    ? age 57  ? APPENDECTOMY    ? DENTAL SURGERY    ? Dental implants  ? LAPAROSCOPIC CHOLECYSTECTOMY  05/2020  ? At surgery center, Dr. Kieth Brightly  ? SPINE SURGERY  Aug 2016  ? Dr. Vertell Limber  ? Uvuectomy    ? VASECTOMY     ? ? ? ?Family History  ?Problem Relation Age of Onset  ? Thyroid disease Mother   ? Hypertension Mother   ? Osteoporosis Mother   ? Glaucoma Mother   ? Thyroid disease Father   ? Hypertension Father   ? Hyperlipidemia Father   ? Heart disease Father   ?     CHF  ? Heart disease Paternal Grandfather 64  ?     Long standing "heart trouble"  ? ? ? ?Social History  ? ?Tobacco Use  ? Smoking status: Former  ?  Packs/day: 1.00  ?  Years: 45.00  ?  Pack years: 45.00  ?  Types: Cigarettes  ?  Quit date: 05/08/2020  ?  Years since quitting: 1.5  ? Smokeless tobacco: Never  ? Tobacco comments:  ?  quit using chantix   ?Substance Use Topics  ? Alcohol use: Yes  ?  Alcohol/week: 20.0 standard drinks  ?  Types: 20 Cans of beer per week  ? Drug use: No  ? ? ? ? ROS ?Constitutional: Denies fever, chills, weight loss/gain, headaches, insomnia,  night sweats or change in appetite. Does c/o fatigue. ?Eyes: Denies redness, blurred vision, diplopia, discharge, itchy or watery eyes.  ?ENT: Denies discharge, congestion, post nasal drip, epistaxis, sore throat, earache, hearing loss, dental pain, Tinnitus, Vertigo, Sinus pain or snoring.  ?Cardio: Denies chest pain, palpitations, irregular heartbeat, syncope, dyspnea, diaphoresis, orthopnea, PND, claudication or edema ?Respiratory: denies cough, dyspnea, DOE, pleurisy, hoarseness, laryngitis or wheezing.  ?Gastrointestinal: Denies dysphagia, heartburn, reflux, water brash, pain, cramps, nausea, vomiting, bloating, diarrhea, constipation, hematemesis, melena, hematochezia, jaundice or hemorrhoids ?Genitourinary: Denies dysuria, frequency, discharge, hematuria or flank pain. Has urgency, nocturia x 2-3 & occasional hesitancy. ?Musculoskeletal: Denies arthralgia, myalgia, stiffness, Jt. Swelling, pain, limp or strain/sprain. Denies Falls. ?Skin: Denies puritis, rash, hives, warts, acne, eczema or change in skin lesion ?Neuro: No weakness, tremor, incoordination, spasms, paresthesia or  pain ?Psychiatric: Denies confusion, memory loss or sensory loss. Denies Depression. ?Endocrine: Denies change in weight, skin, hair change, nocturia, and paresthesia, diabetic polys, visual blurring or hyper / hypo glycemic episodes.  ?Heme/Lymph: No excessive bleeding, bruising or enlarged lymph nodes. ? ? ?Physical Exam ? ?  BP (!) 160/88   Pulse 82   Temp 97.9 ?F (36.6 ?C)   Resp 16   Ht '6\' 2"'$  (1.88 m)   Wt 214 lb 3.2 oz (97.2 kg)   SpO2 96%   BMI 27.50 kg/m?  ? ?General Appearance: Well nourished and well groomed and in no apparent distress. ? ?Eyes: PERRLA, EOMs, conjunctiva no swelling or erythema, normal fundi and vessels. ?Sinuses: No frontal/maxillary tenderness ?ENT/Mouth: EACs patent / TMs  nl. Nares clear without erythema, swelling, mucoid exudates. Oral hygiene is good. No erythema, swelling, or exudate. Tongue normal, non-obstructing. Tonsils not swollen or erythematous. Hearing normal.  ?Neck: Supple, thyroid not palpable. No bruits, nodes or JVD. ?Respiratory: Respiratory effort normal.  BS equal and clear bilateral without rales, rhonci, wheezing or stridor. ?Cardio: Heart sounds are normal with regular rate and rhythm and no murmurs, rubs or gallops. Peripheral pulses are normal and equal bilaterally without edema. No aortic or femoral bruits. ?Chest: symmetric with normal excursions and percussion.  ?Abdomen: Soft, with Nl bowel sounds. Nontender, no guarding, rebound, hernias, masses, or organomegaly.  ?Lymphatics: Non tender without lymphadenopathy.  ?Musculoskeletal: Full ROM all peripheral extremities, joint stability, 5/5 strength, and normal gait. ?Skin: Warm and dry without rashes, lesions, cyanosis, clubbing or  ecchymosis.  ?Neuro: Cranial nerves intact, reflexes equal bilaterally. Normal muscle tone, no cerebellar symptoms. Sensation intact.  ?Pysch: Alert and oriented X 3 with normal affect, insight and judgment appropriate.  ? ?Assessment and Plan ? ?1. Essential  hypertension ? ?- EKG 12-Lead ?- Urinalysis, Routine w reflex microscopic ?- Microalbumin / creatinine urine ratio ?- CBC with Differential/Platelet ?- COMPLETE METABOLIC PANEL WITH GFR ?- Magnesium ?- TSH ? ?2. Hyperlipidemia, mixed ? ?- EK

## 2021-12-11 ENCOUNTER — Ambulatory Visit (INDEPENDENT_AMBULATORY_CARE_PROVIDER_SITE_OTHER): Payer: Medicare Other | Admitting: Internal Medicine

## 2021-12-11 ENCOUNTER — Other Ambulatory Visit: Payer: Self-pay

## 2021-12-11 ENCOUNTER — Encounter: Payer: Self-pay | Admitting: Internal Medicine

## 2021-12-11 VITALS — BP 160/88 | HR 82 | Temp 97.9°F | Resp 16 | Ht 74.0 in | Wt 214.2 lb

## 2021-12-11 DIAGNOSIS — E782 Mixed hyperlipidemia: Secondary | ICD-10-CM | POA: Diagnosis not present

## 2021-12-11 DIAGNOSIS — I700 Atherosclerosis of aorta: Secondary | ICD-10-CM

## 2021-12-11 DIAGNOSIS — R7309 Other abnormal glucose: Secondary | ICD-10-CM

## 2021-12-11 DIAGNOSIS — Z136 Encounter for screening for cardiovascular disorders: Secondary | ICD-10-CM | POA: Diagnosis not present

## 2021-12-11 DIAGNOSIS — Z8249 Family history of ischemic heart disease and other diseases of the circulatory system: Secondary | ICD-10-CM

## 2021-12-11 DIAGNOSIS — N401 Enlarged prostate with lower urinary tract symptoms: Secondary | ICD-10-CM | POA: Diagnosis not present

## 2021-12-11 DIAGNOSIS — Z79899 Other long term (current) drug therapy: Secondary | ICD-10-CM | POA: Diagnosis not present

## 2021-12-11 DIAGNOSIS — Z125 Encounter for screening for malignant neoplasm of prostate: Secondary | ICD-10-CM

## 2021-12-11 DIAGNOSIS — Z122 Encounter for screening for malignant neoplasm of respiratory organs: Secondary | ICD-10-CM

## 2021-12-11 DIAGNOSIS — E559 Vitamin D deficiency, unspecified: Secondary | ICD-10-CM | POA: Diagnosis not present

## 2021-12-11 DIAGNOSIS — I10 Essential (primary) hypertension: Secondary | ICD-10-CM

## 2021-12-11 DIAGNOSIS — Z87891 Personal history of nicotine dependence: Secondary | ICD-10-CM | POA: Diagnosis not present

## 2021-12-11 DIAGNOSIS — Z1211 Encounter for screening for malignant neoplasm of colon: Secondary | ICD-10-CM

## 2021-12-11 MED ORDER — TADALAFIL 20 MG PO TABS: ORAL_TABLET | ORAL | 1 refills | Status: DC

## 2021-12-12 LAB — COMPLETE METABOLIC PANEL WITH GFR
AG Ratio: 1.6 (calc) (ref 1.0–2.5)
ALT: 12 U/L (ref 9–46)
AST: 17 U/L (ref 10–35)
Albumin: 4.5 g/dL (ref 3.6–5.1)
Alkaline phosphatase (APISO): 71 U/L (ref 35–144)
BUN: 15 mg/dL (ref 7–25)
CO2: 30 mmol/L (ref 20–32)
Calcium: 9.7 mg/dL (ref 8.6–10.3)
Chloride: 104 mmol/L (ref 98–110)
Creat: 1.26 mg/dL (ref 0.70–1.28)
Globulin: 2.8 g/dL (calc) (ref 1.9–3.7)
Glucose, Bld: 129 mg/dL — ABNORMAL HIGH (ref 65–99)
Potassium: 4.2 mmol/L (ref 3.5–5.3)
Sodium: 141 mmol/L (ref 135–146)
Total Bilirubin: 0.6 mg/dL (ref 0.2–1.2)
Total Protein: 7.3 g/dL (ref 6.1–8.1)
eGFR: 61 mL/min/{1.73_m2} (ref >=60)

## 2021-12-12 LAB — CBC WITH DIFFERENTIAL/PLATELET
Absolute Monocytes: 318 cells/uL (ref 200–950)
Basophils Absolute: 30 cells/uL (ref 0–200)
Basophils Relative: 0.7 %
Eosinophils Absolute: 90 cells/uL (ref 15–500)
Eosinophils Relative: 2.1 %
HCT: 41.7 % (ref 38.5–50.0)
Hemoglobin: 14.3 g/dL (ref 13.2–17.1)
Lymphs Abs: 976 cells/uL (ref 850–3900)
MCH: 30.5 pg (ref 27.0–33.0)
MCHC: 34.3 g/dL (ref 32.0–36.0)
MCV: 88.9 fL (ref 80.0–100.0)
MPV: 10.7 fL (ref 7.5–12.5)
Monocytes Relative: 7.4 %
Neutro Abs: 2885 cells/uL (ref 1500–7800)
Neutrophils Relative %: 67.1 %
Platelets: 219 10*3/uL (ref 140–400)
RBC: 4.69 10*6/uL (ref 4.20–5.80)
RDW: 12.9 % (ref 11.0–15.0)
Total Lymphocyte: 22.7 %
WBC: 4.3 10*3/uL (ref 3.8–10.8)

## 2021-12-12 LAB — URINALYSIS, ROUTINE W REFLEX MICROSCOPIC
Bilirubin Urine: NEGATIVE
Glucose, UA: NEGATIVE
Hgb urine dipstick: NEGATIVE
Ketones, ur: NEGATIVE
Leukocytes,Ua: NEGATIVE
Nitrite: NEGATIVE
Protein, ur: NEGATIVE
Specific Gravity, Urine: 1.011 (ref 1.001–1.035)
pH: 6 (ref 5.0–8.0)

## 2021-12-12 LAB — LIPID PANEL
Cholesterol: 127 mg/dL (ref <200)
HDL: 45 mg/dL (ref >=40)
LDL Cholesterol (Calc): 65 mg/dL (calc)
Non-HDL Cholesterol (Calc): 82 mg/dL (calc) (ref <130)
Total CHOL/HDL Ratio: 2.8 (calc) (ref <5.0)
Triglycerides: 89 mg/dL (ref <150)

## 2021-12-12 LAB — VITAMIN D 25 HYDROXY (VIT D DEFICIENCY, FRACTURES): Vit D, 25-Hydroxy: 82 ng/mL (ref 30–100)

## 2021-12-12 LAB — INSULIN, RANDOM: Insulin: 31.9 u[IU]/mL — ABNORMAL HIGH

## 2021-12-12 LAB — PSA: PSA: 3.26 ng/mL (ref <=4.00)

## 2021-12-12 LAB — HEMOGLOBIN A1C
Hgb A1c MFr Bld: 5.9 % of total Hgb — ABNORMAL HIGH (ref <5.7)
Mean Plasma Glucose: 123 mg/dL
eAG (mmol/L): 6.8 mmol/L

## 2021-12-12 LAB — MICROALBUMIN / CREATININE URINE RATIO
Creatinine, Urine: 79 mg/dL (ref 20–320)
Microalb Creat Ratio: 4 mcg/mg creat (ref <30)
Microalb, Ur: 0.3 mg/dL

## 2021-12-12 LAB — TSH: TSH: 0.83 mIU/L (ref 0.40–4.50)

## 2021-12-12 LAB — MAGNESIUM: Magnesium: 1.8 mg/dL (ref 1.5–2.5)

## 2021-12-12 NOTE — Progress Notes (Signed)
<><><><><><><><><><><><><><><><><><><><><><><><><><><><><><><><><> ?<><><><><><><><><><><><><><><><><><><><><><><><><><><><><><><><><> ?-   Test results slightly outside the reference range are not unusual. ?If there is anything important, I will review this with you,  ?otherwise it is considered normal test values.  ?If you have further questions,  ?please do not hesitate to contact me at the office or via My Chart.  ?<><><><><><><><><><><><><><><><><><><><><><><><><><><><><><><><><> ?<><><><><><><><><><><><><><><><><><><><><><><><><><><><><><><><><> ? ?-  Glucose = 129 was up a little bit at blood draw and  ?                                       A1c = 5.9 % was also up a little from previous A1c = 5.5%  ? ?- So . . . . . . . . . . . ? ?- Avoid Sweets, Candy & White Stuff  ? ?- [place], [place], White Flour  - Breads &  Pasta ?<><><><><><><><><><><><><><><><><><><><><><><><><><><><><><><><><> ?<><><><><><><><><><><><><><><><><><><><><><><><><><><><><><><><><> ? ?-  PSA was up a little, but still in Normal range, So will need to monitor at 6/27 OV  ?<><><><><><><><><><><><><><><><><><><><><><><><><><><><><><><><><> ?<><><><><><><><><><><><><><><><><><><><><><><><><><><><><><><><><> ? ?-  Total Chol = 127   &   LDL Chol = 65 - Both  Excellent   !  ? ?- Very low risk for Heart Attack  / Stroke ?<><><><><><><><><><><><><><><><><><><><><><><><><><><><><><><><><> ?<><><><><><><><><><><><><><><><><><><><><><><><><><><><><><><><><> ? ?-  Vitamin D = 82 - Excellent   -   Please keep dose same  ?<><><><><><><><><><><><><><><><><><><><><><><><><><><><><><><><><> ?<><><><><><><><><><><><><><><><><><><><><><><><><><><><><><><><><> ? ?-  All Else - CBC - Kidneys - Electrolytes - Liver - Magnesium & Thyroid   ? ?- all  Normal / OK ?<><><><><><><><><><><><><><><><><><><><><><><><><><><><><><><><><> ?<><><><><><><><><><><><><><><><><><><><><><><><><><><><><><><><><> ? ?-  Keep up the [place] Work  !   ? ?<><><><><><><><><><><><><><><><><><><><><><><><><><><><><><><><><> ?<><><><><><><><><><><><><><><><><><><><><><><><><><><><><><><><><> ? ? ? ? ? ? ? ? ? ? ? ? ? ? ? ? ? ? ? ? ? ? ? ? ?

## 2021-12-13 ENCOUNTER — Other Ambulatory Visit: Payer: Self-pay | Admitting: Adult Health

## 2021-12-23 ENCOUNTER — Other Ambulatory Visit: Payer: Self-pay | Admitting: Internal Medicine

## 2021-12-23 DIAGNOSIS — R197 Diarrhea, unspecified: Secondary | ICD-10-CM

## 2022-01-06 DIAGNOSIS — M791 Myalgia, unspecified site: Secondary | ICD-10-CM | POA: Diagnosis not present

## 2022-01-06 DIAGNOSIS — M5126 Other intervertebral disc displacement, lumbar region: Secondary | ICD-10-CM | POA: Diagnosis not present

## 2022-01-07 ENCOUNTER — Other Ambulatory Visit: Payer: Medicare Other

## 2022-01-29 ENCOUNTER — Other Ambulatory Visit: Payer: Self-pay | Admitting: Internal Medicine

## 2022-02-14 ENCOUNTER — Ambulatory Visit
Admission: RE | Admit: 2022-02-14 | Discharge: 2022-02-14 | Disposition: A | Discharge status: Home / Self Care | Payer: Medicare Other | Source: Ambulatory Visit | Attending: Internal Medicine | Admitting: Internal Medicine

## 2022-02-14 DIAGNOSIS — Z87891 Personal history of nicotine dependence: Secondary | ICD-10-CM

## 2022-02-14 DIAGNOSIS — K449 Diaphragmatic hernia without obstruction or gangrene: Secondary | ICD-10-CM | POA: Diagnosis not present

## 2022-02-14 DIAGNOSIS — I2510 Atherosclerotic heart disease of native coronary artery without angina pectoris: Secondary | ICD-10-CM | POA: Diagnosis not present

## 2022-02-14 DIAGNOSIS — Z122 Encounter for screening for malignant neoplasm of respiratory organs: Secondary | ICD-10-CM

## 2022-02-14 DIAGNOSIS — J439 Emphysema, unspecified: Secondary | ICD-10-CM | POA: Diagnosis not present

## 2022-02-15 NOTE — Progress Notes (Signed)
<><><><><><><><><><><><><><><><><><><><><><><><><><><><><><><><><> <><><><><><><><><><><><><><><><><><><><><><><><><><><><><><><><><>  -   CXR - Negative for Lung Cancer  - Radiologist recommends continue Annual Screening   <><><><><><><><><><><><><><><><><><><><><><><><><><><><><><><><><> <><><><><><><><><><><><><><><><><><><><><><><><><><><><><><><><><>  -

## 2022-03-24 NOTE — Progress Notes (Signed)
Future Appointments  Date Time Provider Department  03/25/2022  9:30 AM Lucky Cowboy, MD [facility]  07/04/2022              Wellness        9:00 AM Raynelle Dick, NP [facility] Frames  12/17/2022              CPE 11:00 AM Lucky Cowboy, MD [facility]    History of Present Illness:       This very nice 71 y.o.  MWM  presents for 3  month follow up with HTN, HLD, Pre-Diabetes and Vitamin D Deficiency.           Patient is treated for HTN  circa 2001  & BP has been controlled at home. Today's BP  is  at goal - 128/68 . Patient has had no complaints of any cardiac type chest pain, palpitations, dyspnea / orthopnea / PND, dizziness, claudication, or dependent edema.        Hyperlipidemia is controlled with diet & Atorvastatin. Patient denies myalgias or other med SE's. Last Lipids were at goal:  Lab Results  Component Value Date   CHOL 127 12/11/2021   HDL 45 12/11/2021   LDLCALC 65 12/11/2021   TRIG 89 12/11/2021   CHOLHDL 2.8 12/11/2021     Also, the patient has history of  glucose intolerance  /PreDiabetes  (A1c 5.9% /2015) and has had no symptoms of reactive hypoglycemia, diabetic polys, paresthesias or visual blurring.  Last A1c was near Normal:  Lab Results  Component Value Date   HGBA1C 5.9 (H) 12/11/2021       Further, the patient also has history of Vitamin D Deficiency and supplements vitamin D without any suspected side-effects. Last vitamin D was at goal:  Lab Results  Component Value Date   VD25OH 82 12/11/2021    Current Outpatient Medications on File Prior to Visit  Medication Sig   aspirin EC 81 MG tablet Take 81 mg by mouth daily.   atorvastatin (LIPITOR) 20 MG tablet TAKE 1 TABLET BY MOUTH EVERY DAY FOR CHOLESTEROL   Cholecalciferol (VITAMIN D-3) 5000 UNITS TABS Take by mouth daily. Alternates between 5000 and 96045   losartan (COZAAR) 50 MG tablet Take 1 tablet  Daily  for BP   methocarbamol (ROBAXIN) 500 MG tablet Take 500 mg by mouth daily  as needed.   tadalafil (CIALIS) 20 MG tablet Take  1/2 to 1 tablet  every 2 to 3 days  as needed for  XXXX   traMADol (ULTRAM) 50 MG tablet Take by mouth every 6 (six) hours as needed.   traZODone (DESYREL) 100 MG tablet TAKE 1 TABLET 1 HOUR BEFORE BEDTIME AS NEEDED FOR SLEEP   No current facility-administered medications on file prior to visit.    No Known Allergies  PMHx:   Past Medical History:  Diagnosis Date   Abnormal liver function test    Anxiety    Colon polyp    DDD (degenerative disc disease)    ED (erectile dysfunction) 04/05/2014   FHx: heart disease 01/28/2018   GERD (gastroesophageal reflux disease)    Hyperlipidemia    Hypertension    Insomnia    Tobacco dependence     Immunization History  Administered Date(s) Administered   Influenza Split 07/05/2014   Influenza, High Dose Seasonal PF 08/21/2015, 08/09/2019, 08/28/2020, 07/04/2021   PFIZER(Purple Top)SARS-COV-2 Vaccination 01/26/2020, 02/20/2020, 09/15/2020   Pneumococcal Conjugate-13 05/20/2016   Pneumococcal Polysaccharide-23 10/06/2012, 11/14/2019  Td 09/29/2006, 07/10/2017   Zoster, Live 08/21/2015    Past Surgical History:  Procedure Laterality Date   ADENOIDECTOMY     age 3   APPENDECTOMY     DENTAL SURGERY     Dental implants   LAPAROSCOPIC CHOLECYSTECTOMY  05/2020   At surgery center, Dr. Judene Companion SURGERY  Aug 2016   Dr. Gypsy Lore     VASECTOMY      FHx:    Reviewed / unchanged  SHx:    Reviewed / unchanged   Systems Review:  Constitutional: Denies fever, chills, wt changes, headaches, insomnia, fatigue, night sweats, change in appetite. Eyes: Denies redness, blurred vision, diplopia, discharge, itchy, watery eyes.  ENT: Denies discharge, congestion, post nasal drip, epistaxis, sore throat, earache, hearing loss, dental pain, tinnitus, vertigo, sinus pain, snoring.  CV: Denies chest pain, palpitations, irregular heartbeat, syncope, dyspnea, diaphoresis, orthopnea,  PND, claudication or edema. Respiratory: denies cough, dyspnea, DOE, pleurisy, hoarseness, laryngitis, wheezing.  Gastrointestinal: Denies dysphagia, odynophagia, heartburn, reflux, water brash, abdominal pain or cramps, nausea, vomiting, bloating, diarrhea, constipation, hematemesis, melena, hematochezia  or hemorrhoids. Genitourinary: Denies dysuria, frequency, urgency, nocturia, hesitancy, discharge, hematuria or flank pain. Musculoskeletal: Denies arthralgias, myalgias, stiffness, jt. swelling, pain, limping or strain/sprain.  Skin: Denies pruritus, rash, hives, warts, acne, eczema or change in skin lesion(s). Neuro: No weakness, tremor, incoordination, spasms, paresthesia or pain. Psychiatric: Denies confusion, memory loss or sensory loss. Endo: Denies change in weight, skin or hair change.  Heme/Lymph: No excessive bleeding, bruising or enlarged lymph nodes.  Physical Exam  BP 128/68   Pulse 82   Temp 97.9 F (36.6 C)   Resp 16   Ht 6\' 2"  (1.88 m)   Wt 214 lb 12.8 oz (97.4 kg)   SpO2 96%   BMI 27.58 kg/m   Appears  well nourished, well groomed  and in no distress.  Eyes: PERRLA, EOMs, conjunctiva no swelling or erythema. Sinuses: No frontal/maxillary tenderness ENT/Mouth: EAC's clear, TM's nl w/o erythema, bulging. Nares clear w/o erythema, swelling, exudates. Oropharynx clear without erythema or exudates. Oral hygiene is good. Tongue normal, non obstructing. Hearing intact.  Neck: Supple. Thyroid not palpable. Car 2+/2+ without bruits, nodes or JVD. Chest: Respirations nl with BS clear & equal w/o rales, rhonchi, wheezing or stridor.  Cor: Heart sounds normal w/ regular rate and rhythm without sig. murmurs, gallops, clicks or rubs. Peripheral pulses normal and equal  without edema.  Abdomen: Soft & bowel sounds normal. Non-tender w/o guarding, rebound, hernias, masses or organomegaly.  Lymphatics: Unremarkable.  Musculoskeletal: Full ROM all peripheral extremities, joint  stability, 5/5 strength and normal gait.  Skin: Warm, dry without exposed rashes, lesions or ecchymosis apparent.  Neuro: Cranial nerves intact, reflexes equal bilaterally. Sensory-motor testing grossly intact. Tendon reflexes grossly intact.  Pysch: Alert & oriented x 3.  Insight and judgement nl & appropriate. No ideations.  Assessment and Plan:  1. Essential hypertension  - Continue medication, monitor blood pressure at home.  - Continue DASH diet.  Reminder to go to the ER if any CP,  SOB, nausea, dizziness, severe HA, changes vision/speech.  - CBC with Differential/Platelet - COMPLETE METABOLIC PANEL WITH GFR - Magnesium - TSH  2. Hyperlipidemia, mixed  - Continue diet/meds, exercise,& lifestyle modifications.  - Continue monitor periodic cholesterol/liver & renal functions   - Lipid panel - TSH  3. Abnormal glucose  - Continue diet, exercise  - Lifestyle modifications.  - Monitor appropriate labs.  - Hemoglobin  A1c - Insulin, random  4. Vitamin D deficiency  - Continue supplementation.  - VITAMIN D 25 Hydroxy   6. Medication management  - CBC with Differential/Platelet - COMPLETE METABOLIC PANEL WITH GFR - Magnesium - Lipid panel - TSH - Hemoglobin A1c - Insulin, random - VITAMIN D 25 Hydroxy        Discussed  regular exercise, BP monitoring, weight control to achieve/maintain BMI less than 25 and discussed med and SE's. Recommended labs to assess and monitor clinical status with further disposition pending results of labs.  I discussed the assessment and treatment plan with the patient. The patient was provided an opportunity to ask questions and all were answered. The patient agreed with the plan and demonstrated an understanding of the instructions.  I provided over 30 minutes of exam, counseling, chart review and  complex critical decision making.         The patient was advised to call back or seek an in-person evaluation if the symptoms worsen or if  the condition fails to improve as anticipated.   Marinus Maw, MD

## 2022-03-25 ENCOUNTER — Ambulatory Visit (INDEPENDENT_AMBULATORY_CARE_PROVIDER_SITE_OTHER): Payer: Medicare Other | Admitting: Internal Medicine

## 2022-03-25 ENCOUNTER — Encounter: Payer: Self-pay | Admitting: Internal Medicine

## 2022-03-25 VITALS — BP 128/68 | HR 82 | Temp 97.9°F | Resp 16 | Ht 74.0 in | Wt 214.8 lb

## 2022-03-25 DIAGNOSIS — Z79899 Other long term (current) drug therapy: Secondary | ICD-10-CM

## 2022-03-25 DIAGNOSIS — E559 Vitamin D deficiency, unspecified: Secondary | ICD-10-CM

## 2022-03-25 DIAGNOSIS — E782 Mixed hyperlipidemia: Secondary | ICD-10-CM

## 2022-03-25 DIAGNOSIS — I10 Essential (primary) hypertension: Secondary | ICD-10-CM | POA: Diagnosis not present

## 2022-03-25 DIAGNOSIS — R7309 Other abnormal glucose: Secondary | ICD-10-CM

## 2022-03-25 DIAGNOSIS — F5101 Primary insomnia: Secondary | ICD-10-CM | POA: Diagnosis not present

## 2022-03-25 MED ORDER — TRAZODONE HCL 100 MG PO TABS: ORAL_TABLET | ORAL | 3 refills | Status: DC

## 2022-03-26 LAB — COMPLETE METABOLIC PANEL WITH GFR
AG Ratio: 1.7 (calc) (ref 1.0–2.5)
ALT: 13 U/L (ref 9–46)
AST: 15 U/L (ref 10–35)
Albumin: 4.4 g/dL (ref 3.6–5.1)
Alkaline phosphatase (APISO): 64 U/L (ref 35–144)
BUN: 22 mg/dL (ref 7–25)
CO2: 25 mmol/L (ref 20–32)
Calcium: 9.5 mg/dL (ref 8.6–10.3)
Chloride: 103 mmol/L (ref 98–110)
Creat: 1.13 mg/dL (ref 0.70–1.28)
Globulin: 2.6 g/dL (calc) (ref 1.9–3.7)
Glucose, Bld: 108 mg/dL — ABNORMAL HIGH (ref 65–99)
Potassium: 4.4 mmol/L (ref 3.5–5.3)
Sodium: 137 mmol/L (ref 135–146)
Total Bilirubin: 0.7 mg/dL (ref 0.2–1.2)
Total Protein: 7 g/dL (ref 6.1–8.1)
eGFR: 69 mL/min/{1.73_m2} (ref >=60)

## 2022-03-26 LAB — CBC WITH DIFFERENTIAL/PLATELET
Absolute Monocytes: 415 cells/uL (ref 200–950)
Basophils Absolute: 40 cells/uL (ref 0–200)
Basophils Relative: 0.8 %
Eosinophils Absolute: 140 cells/uL (ref 15–500)
Eosinophils Relative: 2.8 %
HCT: 40.7 % (ref 38.5–50.0)
Hemoglobin: 14 g/dL (ref 13.2–17.1)
Lymphs Abs: 1130 cells/uL (ref 850–3900)
MCH: 30.5 pg (ref 27.0–33.0)
MCHC: 34.4 g/dL (ref 32.0–36.0)
MCV: 88.7 fL (ref 80.0–100.0)
MPV: 10.4 fL (ref 7.5–12.5)
Monocytes Relative: 8.3 %
Neutro Abs: 3275 cells/uL (ref 1500–7800)
Neutrophils Relative %: 65.5 %
Platelets: 219 10*3/uL (ref 140–400)
RBC: 4.59 10*6/uL (ref 4.20–5.80)
RDW: 11.8 % (ref 11.0–15.0)
Total Lymphocyte: 22.6 %
WBC: 5 10*3/uL (ref 3.8–10.8)

## 2022-03-26 LAB — INSULIN, RANDOM: Insulin: 18.2 u[IU]/mL

## 2022-03-26 LAB — VITAMIN D 25 HYDROXY (VIT D DEFICIENCY, FRACTURES): Vit D, 25-Hydroxy: 95 ng/mL (ref 30–100)

## 2022-03-26 LAB — LIPID PANEL
Cholesterol: 129 mg/dL (ref <200)
HDL: 42 mg/dL (ref >=40)
LDL Cholesterol (Calc): 69 mg/dL (calc)
Non-HDL Cholesterol (Calc): 87 mg/dL (calc) (ref <130)
Total CHOL/HDL Ratio: 3.1 (calc) (ref <5.0)
Triglycerides: 99 mg/dL (ref <150)

## 2022-03-26 LAB — HEMOGLOBIN A1C
Hgb A1c MFr Bld: 5.5 % of total Hgb (ref <5.7)
Mean Plasma Glucose: 111 mg/dL
eAG (mmol/L): 6.2 mmol/L

## 2022-03-26 LAB — MAGNESIUM: Magnesium: 1.8 mg/dL (ref 1.5–2.5)

## 2022-03-26 LAB — TSH: TSH: 1.07 mIU/L (ref 0.40–4.50)

## 2022-03-26 NOTE — Progress Notes (Signed)
<><><><><><><><><><><><><><><><><><><><><><><><><><><><><><><><><> <><><><><><><><><><><><><><><><><><><><><><><><><><><><><><><><><> -   Test results slightly outside the reference range are not unusual. If there is anything important, I will review this with you,  otherwise it is considered normal test values.  If you have further questions,  please do not hesitate to contact me at the office or via My Chart.  <><><><><><><><><><><><><><><><><><><><><><><><><><><><><><><><><> <><><><><><><><><><><><><><><><><><><><><><><><><><><><><><><><><>  -   Magnesium  -   1.8   -  very  low  -  goal is betw 2.0 - 2.5,   - So..............Marland Kitchen  Recommend that you take  # 2 tablets Magnesium 500 mg daily   - also important to eat lots of  leafy green vegetables   - spinach - Kale - collards - greens - okra - asparagus  - broccoli - quinoa - squash - almonds   - black, red, white beans  -  peas - green beans <><><><><><><><><><><><><><><><><><><><><><><><><><><><><><><><><>  -  A1c = 5.5% - Back in  Perfectly Normal  Range - no Diabetes  !  <><><><><><><><><><><><><><><><><><><><><><><><><><><><><><><><><>  -  Vitamin D= 95 - Also Excellent  ! <><><><><><><><><><><><><><><><><><><><><><><><><><><><><><><><><>  -  Keep up the Great Work  !   <><><><><><><><><><><><><><><><><><><><><><><><><><><><><><><><><> <><><><><><><><><><><><><><><><><><><><><><><><><><><><><><><><><>  -

## 2022-04-14 DIAGNOSIS — H52203 Unspecified astigmatism, bilateral: Secondary | ICD-10-CM | POA: Diagnosis not present

## 2022-04-14 DIAGNOSIS — H2513 Age-related nuclear cataract, bilateral: Secondary | ICD-10-CM | POA: Diagnosis not present

## 2022-04-14 DIAGNOSIS — H5213 Myopia, bilateral: Secondary | ICD-10-CM | POA: Diagnosis not present

## 2022-05-03 ENCOUNTER — Other Ambulatory Visit: Payer: Self-pay | Admitting: Internal Medicine

## 2022-05-28 ENCOUNTER — Encounter (HOSPITAL_BASED_OUTPATIENT_CLINIC_OR_DEPARTMENT_OTHER): Payer: Self-pay

## 2022-05-28 ENCOUNTER — Emergency Department (HOSPITAL_BASED_OUTPATIENT_CLINIC_OR_DEPARTMENT_OTHER): Payer: Medicare Other

## 2022-05-28 ENCOUNTER — Emergency Department (HOSPITAL_BASED_OUTPATIENT_CLINIC_OR_DEPARTMENT_OTHER)
Admission: EM | Admit: 2022-05-28 | Discharge: 2022-05-28 | Disposition: A | Discharge status: Home / Self Care | Payer: Medicare Other | Attending: Emergency Medicine | Admitting: Emergency Medicine

## 2022-05-28 ENCOUNTER — Other Ambulatory Visit: Payer: Self-pay

## 2022-05-28 DIAGNOSIS — Z7982 Long term (current) use of aspirin: Secondary | ICD-10-CM | POA: Insufficient documentation

## 2022-05-28 DIAGNOSIS — R42 Dizziness and giddiness: Secondary | ICD-10-CM | POA: Insufficient documentation

## 2022-05-28 LAB — BASIC METABOLIC PANEL
Anion gap: 8 (ref 5–15)
BUN: 17 mg/dL (ref 8–23)
CO2: 28 mmol/L (ref 22–32)
Calcium: 10.1 mg/dL (ref 8.9–10.3)
Chloride: 103 mmol/L (ref 98–111)
Creatinine, Ser: 1.3 mg/dL — ABNORMAL HIGH (ref 0.61–1.24)
GFR, Estimated: 59 mL/min — ABNORMAL LOW (ref >60)
Glucose, Bld: 126 mg/dL — ABNORMAL HIGH (ref 70–99)
Potassium: 4.4 mmol/L (ref 3.5–5.1)
Sodium: 139 mmol/L (ref 135–145)

## 2022-05-28 LAB — CBC
HCT: 39.4 % (ref 39.0–52.0)
Hemoglobin: 13.3 g/dL (ref 13.0–17.0)
MCH: 30.4 pg (ref 26.0–34.0)
MCHC: 33.8 g/dL (ref 30.0–36.0)
MCV: 90 fL (ref 80.0–100.0)
Platelets: 211 10*3/uL (ref 150–400)
RBC: 4.38 MIL/uL (ref 4.22–5.81)
RDW: 12.4 % (ref 11.5–15.5)
WBC: 4.7 10*3/uL (ref 4.0–10.5)
nRBC: 0 % (ref 0.0–0.2)

## 2022-05-28 LAB — URINALYSIS, ROUTINE W REFLEX MICROSCOPIC
Bilirubin Urine: NEGATIVE
Glucose, UA: NEGATIVE mg/dL
Hgb urine dipstick: NEGATIVE
Ketones, ur: NEGATIVE mg/dL
Leukocytes,Ua: NEGATIVE
Nitrite: NEGATIVE
Protein, ur: NEGATIVE mg/dL
Specific Gravity, Urine: 1.01 (ref 1.005–1.030)
pH: 6 (ref 5.0–8.0)

## 2022-05-28 LAB — TROPONIN I (HIGH SENSITIVITY)
Troponin I (High Sensitivity): 3 ng/L (ref <18)
Troponin I (High Sensitivity): 3 ng/L (ref <18)

## 2022-05-28 LAB — CBG MONITORING, ED: Glucose-Capillary: 120 mg/dL — ABNORMAL HIGH (ref 70–99)

## 2022-05-28 NOTE — ED Triage Notes (Signed)
Patient here POV from Home.  Endorses Recent Dizziness Intermittently for Approximately 3 Weeks. This AM was worse and more constant.   BP Assessed at Home and Orthostatic VS were Positive. No SOB. No Pain.   NAD Noted during Triage. A&Ox4. GCS 15. Ambulatory.

## 2022-05-28 NOTE — ED Notes (Signed)
Pt currently laying flat for Orthostatic VS.

## 2022-05-28 NOTE — Discharge Instructions (Signed)
Drink plenty of fluids

## 2022-05-29 NOTE — Progress Notes (Deleted)
Assessment and Plan:  There are no diagnoses linked to this encounter.    Further disposition pending results of labs. Discussed med's effects and SE's.   Over 30 minutes of exam, counseling, chart review, and critical decision making was performed.   Future Appointments  Date Time Provider [place]  05/30/2022 10:45 AM Alycia Rossetti, NP [facility] None  07/04/2022  9:00 AM Alycia Rossetti, NP [facility] None  12/17/2022 11:00 AM Unk Pinto, MD [facility] None    ------------------------------------------------------------------------------------------------------------------   HPI There were no vitals taken for this visit. 71 y.o.male presents for  Past Medical History:  Diagnosis Date   Abnormal liver function test    Anxiety    Colon polyp    DDD (degenerative disc disease)    ED (erectile dysfunction) 04/05/2014   FHx: heart disease 01/28/2018   GERD (gastroesophageal reflux disease)    Hyperlipidemia    Hypertension    Insomnia    Tobacco dependence      No Known Allergies  Current Outpatient Medications on File Prior to Visit  Medication Sig   aspirin EC 81 MG tablet Take 81 mg by mouth daily.   atorvastatin (LIPITOR) 20 MG tablet TAKE 1 TABLET BY MOUTH EVERY DAY FOR CHOLESTEROL   Cholecalciferol (VITAMIN D-3) 5000 UNITS TABS Take by mouth daily. Alternates between 5000 and 10000   losartan (COZAAR) 50 MG tablet Take 1 tablet  Daily  for BP   methocarbamol (ROBAXIN) 500 MG tablet Take 500 mg by mouth daily as needed.   tadalafil (CIALIS) 20 MG tablet Take 1/2-1 tablet every 2-3 days as needed for XXXX                                  /                          TAKE                   BY                       MOUTH   traMADol (ULTRAM) 50 MG tablet Take by mouth every 6 (six) hours as needed.   traZODone (DESYREL) 100 MG tablet Take  1 tablet 1-2 hours  before Bedtime  as needed for Sleep   No current facility-administered medications on file prior  to visit.    ROS: all negative except above.   Physical Exam:  There were no vitals taken for this visit.  General Appearance: Well nourished, in no apparent distress. Eyes: PERRLA, EOMs, conjunctiva no swelling or erythema Sinuses: No Frontal/maxillary tenderness ENT/Mouth: Ext aud canals clear, TMs without erythema, bulging. No erythema, swelling, or exudate on post pharynx.  Tonsils not swollen or erythematous. Hearing normal.  Neck: Supple, thyroid normal.  Respiratory: Respiratory effort normal, BS equal bilaterally without rales, rhonchi, wheezing or stridor.  Cardio: RRR with no MRGs. Brisk peripheral pulses without edema.  Abdomen: Soft, + BS.  Non tender, no guarding, rebound, hernias, masses. Lymphatics: Non tender without lymphadenopathy.  Musculoskeletal: Full ROM, 5/5 strength, normal gait.  Skin: Warm, dry without rashes, lesions, ecchymosis.  Neuro: Cranial nerves intact. Normal muscle tone, no cerebellar symptoms. Sensation intact.  Psych: Awake and oriented X 3, normal affect, Insight and Judgment appropriate.     Alycia Rossetti, NP 3:56 PM [facility]

## 2022-05-30 ENCOUNTER — Ambulatory Visit (INDEPENDENT_AMBULATORY_CARE_PROVIDER_SITE_OTHER): Payer: Medicare Other | Admitting: Nurse Practitioner

## 2022-05-30 ENCOUNTER — Encounter: Payer: Self-pay | Admitting: Nurse Practitioner

## 2022-05-30 VITALS — BP 146/80 | HR 70 | Temp 97.9°F | Resp 17 | Ht 74.0 in | Wt 216.6 lb

## 2022-05-30 DIAGNOSIS — Z79899 Other long term (current) drug therapy: Secondary | ICD-10-CM | POA: Diagnosis not present

## 2022-05-30 DIAGNOSIS — I952 Hypotension due to drugs: Secondary | ICD-10-CM | POA: Diagnosis not present

## 2022-05-30 DIAGNOSIS — N528 Other male erectile dysfunction: Secondary | ICD-10-CM

## 2022-05-30 DIAGNOSIS — R42 Dizziness and giddiness: Secondary | ICD-10-CM | POA: Diagnosis not present

## 2022-05-30 NOTE — Progress Notes (Unsigned)
Assessment and Plan:  Arthur Garcia was seen today for a follow up.  Diagnoses and all order for this visit:  1. Dizziness Monitor BP when taking anti-hypertensive's along with sildenafil as this can increase r/f hypotension and dizziness. Change positions slowly. Stay well hydrated.  2. Hypotension due to drugs Continue to monitor  3. Other male erectile dysfunction Continue sildenafil as directed. Monitor BP   4. Medication management All medications discussed and reviewed in full. All questions and concerns regarding medications addressed.    Notify office for further evaluation and treatment, questions or concerns if s/s fail to improve. The risks and benefits of my recommendations, as well as other treatment options were discussed with the patient today. Questions were answered.  Further disposition pending results of labs. Discussed med's effects and SE's.    Over 20 minutes of exam, counseling, chart review, and critical decision making was performed.   Future Appointments  Date Time Provider [place]  07/04/2022  9:00 AM Alycia Rossetti, NP [facility] None  12/17/2022 11:00 AM Unk Pinto, MD [facility] None    ------------------------------------------------------------------------------------------------------------------   HPI BP (!) 146/80   Pulse 70   Temp 97.9 F (36.6 C)   Resp 17   Ht '6\' 2"'$  (1.88 m)   Wt 216 lb 9.6 oz (98.2 kg)   SpO2 96%   BMI 27.81 kg/m   71 y.o.male presents for evaluation of dizziness after been seen in the ER on on 05/28/22 for complaints worsening symptoms.  He had a full negative work up including CT of head which was negative.   He reports taking Sildenafil for the last 3 weeks every other day.  This is a medication change for him.    EKG revealed NSR with RBBB.  BP is above range today.  He is asymptomatic.  Images while in the hospital: CT Head Wo Contrast  Result Date: 05/28/2022 CLINICAL DATA:   Dizziness, nonspecific. Symptoms over the last 3 weeks. EXAM: CT HEAD WITHOUT CONTRAST TECHNIQUE: Contiguous axial images were obtained from the base of the skull through the vertex without intravenous contrast. RADIATION DOSE REDUCTION: This exam was performed according to the departmental dose-optimization program which includes automated exposure control, adjustment of the mA and/or kV according to patient size and/or use of iterative reconstruction technique. COMPARISON:  MRI 11/12/2016 FINDINGS: Brain: No sign of acute infarction. There are probably minimal small vessel changes of the cerebral hemispheric white matter. No mass lesion, hemorrhage, hydrocephalus or extra-axial collection. Vascular: There is atherosclerotic calcification of the major vessels at the base of the brain. Skull: Negative Sinuses/Orbits: Visualized sinuses are clear. Orbits are negative. No fluid in the middle ears or mastoid air cells. Other: None IMPRESSION: No acute finding. No explanation of the clinical presentation. Mild/minimal small vessel change of the cerebral hemispheric white matter is suspected. Electronically Signed   By: Nelson Chimes M.D.   On: 05/28/2022 14:29   Past Medical History:  Diagnosis Date   Abnormal liver function test    Anxiety    Colon polyp    DDD (degenerative disc disease)    ED (erectile dysfunction) 04/05/2014   FHx: heart disease 01/28/2018   GERD (gastroesophageal reflux disease)    Hyperlipidemia    Hypertension    Insomnia    Tobacco dependence      No Known Allergies  Current Outpatient Medications on File Prior to Visit  Medication Sig   aspirin EC 81 MG tablet Take 81 mg by mouth daily.  atorvastatin (LIPITOR) 20 MG tablet TAKE 1 TABLET BY MOUTH EVERY DAY FOR CHOLESTEROL   Cholecalciferol (VITAMIN D-3) 5000 UNITS TABS Take by mouth daily. Alternates between 5000 and 10000   losartan (COZAAR) 50 MG tablet Take 1 tablet  Daily  for BP   methocarbamol (ROBAXIN) 500 MG tablet  Take 500 mg by mouth daily as needed.   tadalafil (CIALIS) 20 MG tablet Take 1/2-1 tablet every 2-3 days as needed for XXXX                                  /                          TAKE                   BY                       MOUTH   traMADol (ULTRAM) 50 MG tablet Take by mouth every 6 (six) hours as needed.   traZODone (DESYREL) 100 MG tablet Take  1 tablet 1-2 hours  before Bedtime  as needed for Sleep   No current facility-administered medications on file prior to visit.    ROS: all negative except what is noted in the HPI.   Physical Exam:  BP (!) 146/80   Pulse 70   Temp 97.9 F (36.6 C)   Resp 17   Ht '6\' 2"'$  (1.88 m)   Wt 216 lb 9.6 oz (98.2 kg)   SpO2 96%   BMI 27.81 kg/m   General Appearance: NAD.  Awake, conversant and cooperative. Eyes: PERRLA, EOMs intact.  Sclera white.  Conjunctiva without erythema. Sinuses: No frontal/maxillary tenderness.  No nasal discharge. Nares patent.  ENT/Mouth: Ext aud canals clear.  Bilateral TMs w/DOL and without erythema or bulging. Hearing intact.  Posterior pharynx without swelling or exudate.  Tonsils without swelling or erythema.  Neck: Supple.  No masses, nodules or thyromegaly. Respiratory: Effort is regular with non-labored breathing. Breath sounds are equal bilaterally without rales, rhonchi, wheezing or stridor.  Cardio: RRR with no MRGs. Brisk peripheral pulses without edema.  Abdomen: Active BS in all four quadrants.  Soft and non-tender without guarding, rebound tenderness, hernias or masses. Lymphatics: Non tender without lymphadenopathy.  Musculoskeletal: Full ROM, 5/5 strength, normal ambulation.  No clubbing or cyanosis. Skin: Appropriate color for ethnicity. Warm without rashes, lesions, ecchymosis, ulcers.  Neuro: CN II-XII grossly normal. Normal muscle tone without cerebellar symptoms and intact sensation.   Psych: AO X 3,  appropriate mood and affect, insight and judgment.     Darrol Jump, NP 8:44  PM [facility]

## 2022-06-01 NOTE — ED Provider Notes (Signed)
[facility] EMERGENCY DEPT Provider Note   CSN: [id number] Arrival date & time: 05/28/22  1153     History  Chief Complaint  Patient presents with   Dizziness    Arthur Garcia is a 71 y.o. male.  Pt reports he has been dizzy for the past 3 weeks.  Pt reports dizziness seems worse today.  Pt denies headache or chest pain.  Pt has not been sick recently.  He denies any fever or chills.  No exposure to anyone with covid or flu.    The history is provided by the patient. No language interpreter was used.  Dizziness Quality:  Lightheadedness Severity:  Moderate Onset quality:  Gradual Duration:  3 weeks Timing:  Constant Progression:  Worsening Relieved by:  Nothing Worsened by:  Nothing Ineffective treatments:  None tried Associated symptoms: no nausea and no vision changes   Risk factors: no anemia        Home Medications Prior to Admission medications   Medication Sig Start Date End Date Taking? Authorizing Provider  aspirin EC 81 MG tablet Take 81 mg by mouth daily.    [provider]  atorvastatin (LIPITOR) 20 MG tablet TAKE 1 TABLET BY MOUTH EVERY DAY FOR CHOLESTEROL 12/13/21   Liane Comber, NP  Cholecalciferol (VITAMIN D-3) 5000 UNITS TABS Take by mouth daily. Alternates between 5000 and 10000    [provider]  losartan (COZAAR) 50 MG tablet Take 1 tablet  Daily  for BP 09/29/21   Unk Pinto, MD  methocarbamol (ROBAXIN) 500 MG tablet Take 500 mg by mouth daily as needed. 12/01/21   [provider]  tadalafil (CIALIS) 20 MG tablet Take 1/2-1 tablet every 2-3 days as needed for XXXX                                  /                          TAKE                   BY                       MOUTH 05/03/22   Unk Pinto, MD  traMADol (ULTRAM) 50 MG tablet Take by mouth every 6 (six) hours as needed.    [provider]  traZODone (DESYREL) 100 MG tablet Take  1 tablet 1-2 hours  before Bedtime  as needed for Sleep  03/25/22   Unk Pinto, MD      Allergies    Patient has no known allergies.    Review of Systems   Review of Systems  Gastrointestinal:  Negative for nausea.  Neurological:  Positive for dizziness.  All other systems reviewed and are negative.   Physical Exam Updated Vital Signs BP (!) 150/84   Pulse (!) 54   Temp 98.3 F (36.8 C)   Resp 14   Ht '6\' 2"'$  (1.88 m)   Wt 97.4 kg   SpO2 97%   BMI 27.57 kg/m  Physical Exam Vitals and nursing note reviewed.  Constitutional:      Appearance: He is well-developed.  HENT:     Head: Normocephalic.     Right Ear: External ear normal.     Left Ear: External ear normal.     Mouth/Throat:     Mouth:  Mucous membranes are moist.  Eyes:     Pupils: Pupils are equal, round, and reactive to light.  Cardiovascular:     Rate and Rhythm: Normal rate.  Pulmonary:     Effort: Pulmonary effort is normal.  Abdominal:     General: Abdomen is flat. There is no distension.  Musculoskeletal:        General: Normal range of motion.     Cervical back: Normal range of motion.  Neurological:     General: No focal deficit present.     Mental Status: He is alert and oriented to person, place, and time.  Psychiatric:        Mood and Affect: Mood normal.     ED Results / Procedures / Treatments   Labs (all labs ordered are listed, but only abnormal results are displayed) Labs Reviewed  BASIC METABOLIC PANEL - Abnormal; Notable for the following components:      Result Value   Glucose, Bld 126 (*)    Creatinine, Ser 1.30 (*)    GFR, Estimated 59 (*)    All other components within normal limits  CBG MONITORING, ED - Abnormal; Notable for the following components:   Glucose-Capillary 120 (*)    All other components within normal limits  CBC  URINALYSIS, ROUTINE W REFLEX MICROSCOPIC  TROPONIN I (HIGH SENSITIVITY)  TROPONIN I (HIGH SENSITIVITY)    EKG  Radiology No results found.  Procedures Procedures    Medications Ordered  in ED Medications - No data to display  ED Course/ Medical Decision Making/ A&P                           Medical Decision Making Pt complains of dizziness  Amount and/or Complexity of Data Reviewed External Data Reviewed: notes.    Details: Prinmary care notes reviewed  Labs: ordered. Decision-making details documented in ED Course.    Details: Labs ordered reviewed and interpreted   troponin is negative glucose is 120 Radiology: ordered and independent interpretation performed. Decision-making details documented in ED Course.    Details: Ct head  no acute cahnges ECG/medicine tests: ordered and independent interpretation performed. Decision-making details documented in ED Course.    Details: No acute  Risk Risk Details: Pt encouraged to drink plenty of fluids,  Follow up with primary care MD for recheck.             Final Clinical Impression(s) / ED Diagnoses Final diagnoses:  Dizziness    Rx / DC Orders ED Discharge Orders     None      An After Visit Summary was printed and given to the patient.    Fransico Meadow, [place] 06/01/22 [id number]    Elnora Morrison, MD 06/01/22 [phone]

## 2022-07-04 ENCOUNTER — Ambulatory Visit (INDEPENDENT_AMBULATORY_CARE_PROVIDER_SITE_OTHER): Payer: Medicare Other | Admitting: Nurse Practitioner

## 2022-07-04 ENCOUNTER — Encounter: Payer: Self-pay | Admitting: Nurse Practitioner

## 2022-07-04 ENCOUNTER — Ambulatory Visit: Payer: Medicare Other | Admitting: Nurse Practitioner

## 2022-07-04 VITALS — BP 110/60 | HR 74 | Temp 97.3°F | Ht 74.0 in | Wt 220.0 lb

## 2022-07-04 DIAGNOSIS — Z79899 Other long term (current) drug therapy: Secondary | ICD-10-CM

## 2022-07-04 DIAGNOSIS — E782 Mixed hyperlipidemia: Secondary | ICD-10-CM

## 2022-07-04 DIAGNOSIS — N528 Other male erectile dysfunction: Secondary | ICD-10-CM

## 2022-07-04 DIAGNOSIS — H9192 Unspecified hearing loss, left ear: Secondary | ICD-10-CM

## 2022-07-04 DIAGNOSIS — J438 Other emphysema: Secondary | ICD-10-CM | POA: Diagnosis not present

## 2022-07-04 DIAGNOSIS — K219 Gastro-esophageal reflux disease without esophagitis: Secondary | ICD-10-CM | POA: Diagnosis not present

## 2022-07-04 DIAGNOSIS — Z0001 Encounter for general adult medical examination with abnormal findings: Secondary | ICD-10-CM | POA: Diagnosis not present

## 2022-07-04 DIAGNOSIS — Z23 Encounter for immunization: Secondary | ICD-10-CM | POA: Diagnosis not present

## 2022-07-04 DIAGNOSIS — E559 Vitamin D deficiency, unspecified: Secondary | ICD-10-CM | POA: Diagnosis not present

## 2022-07-04 DIAGNOSIS — Z0000 Encounter for general adult medical examination without abnormal findings: Secondary | ICD-10-CM

## 2022-07-04 DIAGNOSIS — I700 Atherosclerosis of aorta: Secondary | ICD-10-CM

## 2022-07-04 DIAGNOSIS — I452 Bifascicular block: Secondary | ICD-10-CM

## 2022-07-04 DIAGNOSIS — R6889 Other general symptoms and signs: Secondary | ICD-10-CM | POA: Diagnosis not present

## 2022-07-04 DIAGNOSIS — R7309 Other abnormal glucose: Secondary | ICD-10-CM | POA: Diagnosis not present

## 2022-07-04 DIAGNOSIS — N182 Chronic kidney disease, stage 2 (mild): Secondary | ICD-10-CM

## 2022-07-04 DIAGNOSIS — I10 Essential (primary) hypertension: Secondary | ICD-10-CM

## 2022-07-04 MED ORDER — LOSARTAN POTASSIUM 50 MG PO TABS: ORAL_TABLET | ORAL | 3 refills | Status: DC

## 2022-07-04 NOTE — Patient Instructions (Signed)

## 2022-07-04 NOTE — Progress Notes (Signed)
MEDICARE ANNUAL WELLNESS VISIT AND FOLLOW UP Assessment:   Diagnoses and all orders for this visit:  Medicare annual wellness visit, subsequent Due annually    Atherosclerosis of aorta Per CT 01/2020 Control blood pressure, cholesterol, glucose, increase exercise.   Essential hypertension Discussed DASH (Dietary Approaches to Stop Hypertension) DASH diet is lower in sodium than a typical American diet. Cut back on foods that are high in saturated fat, cholesterol, and trans fats. Eat more whole-grain foods, fish, poultry, and nuts Remain active and exercise as tolerated daily.  Monitor BP at home-Call if greater than 130/80.  Check CMP/CBC   Other male erectile dysfunction Treated by Cialis PRN; no concerns or complications Established with urology; follow up PRN  Hyperlipidemia, unspecified hyperlipidemia type Discussed lifestyle modifications. Recommended diet heavy in fruits and veggies, omega 3's. Decrease consumption of animal meats, cheeses, and dairy products. Remain active and exercise as tolerated. Continue to monitor. Check lipids/TSH   TOBACCO ABUSE/Emphysema noted on CT 01/2021 PT stop quitting smoking 04/2020 CT screening emphysema,  Lung-RADS 1, negative.  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 (LAFB) Evaluated by cardiology; no further interventions recommended unless progresses and symptomatic Continue annual EKG and as indicated  Other abnormal glucose Education: Reviewed 'ABCs' of diabetes management  Discussed goals to be met and/or maintained include A1C (<7) Blood pressure (<130/80) Cholesterol (LDL <70) Continue Eye Exam yearly  Continue Dental Exam Q6 mo Discussed dietary recommendations Discussed Physical Activity recommendations Foot exam UTD Check A1C   Vitamin D deficiency At goal; continue supplementation Check vitamin D level   Medication management All medications  discussed and reviewed in full. All questions and concerns regarding medications addressed.    CKD (chronic kidney disease) stage 2, GFR 60-89 ml/min Continue medications Discussed how what you eat and drink can aide in kidney protection. Stay well hydrated. Avoid high salt foods. Avoid NSAIDS. Keep BP and BG well controlled.   Take medications as prescribed. Remain active and exercise as tolerated daily. Maintain weight.  Continue to monitor. Check CMP/GFR/Microablumin  GERD No suspected reflux complications (Barret/stricture). Lifestyle modification:  wt loss, avoid meals 2-3h before bedtime. Consider eliminating food triggers:  chocolate, caffeine, EtOH, acid/spicy food.   Need for flu Vaccine High dose vaccine administered. Patient tolerated well  Refer to Audiology Left ear hearing loss evaluation  Orders Placed This Encounter  Procedures   Flu vaccine HIGH DOSE PF (Fluzone High dose)   COMPLETE METABOLIC PANEL WITH GFR   Meds ordered this encounter  Medications   losartan (COZAAR) 50 MG tablet    Sig: Take 1 tablet  Daily  for BP    Dispense:  90 tablet    Refill:  3    Notify office for further evaluation and treatment, questions or concerns if any reported s/s fail to improve.   The patient was advised to call back or seek an in-person evaluation if any symptoms worsen or if the condition fails to improve as anticipated.   Further disposition pending results of labs. Discussed med's effects and SE's.    I discussed the assessment and treatment plan with the patient. The patient was provided an opportunity to ask questions and all were answered. The patient agreed with the plan and demonstrated an understanding of the instructions.  Discussed med's effects and SE's. Screening labs and tests as requested with regular follow-up as recommended.  I provided 30 minutes of face-to-face time during this encounter including counseling,  chart review, and critical  decision making was preformed.   Future Appointments  Date Time Provider Addy  12/17/2022 11:00 AM Unk Pinto, MD [facility] None  07/06/2023  9:00 AM Darrol Jump, NP [facility] None     Plan:   During the course of the visit the patient was educated and counseled about appropriate screening and preventive services including:   Pneumococcal vaccine  Influenza vaccine Prevnar 13 Td vaccine Screening electrocardiogram Colorectal cancer screening Diabetes screening Glaucoma screening Nutrition counseling    Subjective:  Arthur Garcia is a 71 y.o. male who presents for Medicare Annual Wellness Visit and 3 month follow up for HTN, hyperlipidemia, glucose management (hx of prediabetes), and vitamin D Def.   Overall he reports feeling well.  He had a visit to the ED on 04/2022 for dizziness.  Understands now this occurs most when taking Viagra for ED.  He is continuing to monitor symptoms and stay well hydrated. Erectile dysfunction progressively worsening over 5-10 year period. Follows with Dr Abner Greenspan urologist. Interested impossibly changing Valsartan to Losartan to see if this helps symptoms.  He has hx of DDD s/p lumbar discectomy x 2 by Dr. Vertell Limber in 2016 - he continues to experience pain and discomfort related to this, currently prescribed norco and neurontin and managed by pain management via Dr. Melven Sartorius office.   Pt quit smoking 04/2020. He has 30+ pack/yr smoking history and recently underwent low dose CT screening in 01/2022 which was negative other than aortic atherosclerosis, and emphysema  He reports left ear hearing loss due to shooting guns.  Wife reports "he can't hear."  He has not had prior evaluation in the past.   BMI is Body mass index is 28.25 kg/m., he has been working on diet and exercise, Wt Readings from Last 3 Encounters:  07/04/22 220 lb (99.8 kg)  05/30/22 216 lb 9.6 oz (98.2 kg)  05/28/22 214 lb 11.7 oz (97.4 kg)   He has not been  checking BPs at home regularly, today their BP is BP: 110/60  Bps at home are running 120-130/80's wife is a Therapist, sports and checks for him.  BP Readings from Last 3 Encounters:  07/04/22 110/60  05/30/22 (!) 146/80  05/28/22 (!) 150/84   He is taking valsartan 171m daily  He does workout. He denies chest pain, shortness of breath, dizziness.   He is on cholesterol medication (atorvastatin 20 mg daily) and denies myalgias. His cholesterol is at goal. The cholesterol last visit was:   Lab Results  Component Value Date   CHOL 129 03/25/2022   HDL 42 03/25/2022   LDLCALC 69 03/25/2022   TRIG 99 03/25/2022   CHOLHDL 3.1 03/25/2022   He has been working on diet and exercise for glucose management, and denies increased appetite, nausea, paresthesia of the feet, polydipsia, polyuria, visual disturbances and vomiting. Last A1C in the office was:  Lab Results  Component Value Date   HGBA1C 5.5 03/25/2022   Last GFR Lab Results  Component Value Date   GFRNONAA 59(L) 05/28/2022    Patient is on Vitamin D supplement and at goal at most recent check:    Lab Results  Component Value Date   VD25OH 95 03/25/2022      Medication Review: Current Outpatient Medications on File Prior to Visit  Medication Sig Dispense Refill   Acetaminophen (TYLENOL PO) Take by mouth. Take two tablets daily in the morning     atorvastatin (LIPITOR) 20 MG tablet TAKE 1 TABLET  BY MOUTH EVERY DAY FOR CHOLESTEROL 90 tablet 1   Cholecalciferol (VITAMIN D-3) 5000 UNITS TABS Take by mouth daily. Alternates between 5000 and 10000     Multiple Vitamins-Minerals (CENTRUM SILVER PO) Take by mouth daily.     tadalafil (CIALIS) 20 MG tablet Take 1/2-1 tablet every 2-3 days as needed for XXXX                                  /                          TAKE                   BY                       MOUTH 30 tablet 1   traMADol (ULTRAM) 50 MG tablet Take by mouth every 6 (six) hours as needed.     traZODone (DESYREL) 100 MG tablet  Take  1 tablet 1-2 hours  before Bedtime  as needed for Sleep 90 tablet 3   aspirin EC 81 MG tablet Take 81 mg by mouth daily. (Patient not taking: Reported on 07/04/2022)     methocarbamol (ROBAXIN) 500 MG tablet Take 500 mg by mouth daily as needed. (Patient not taking: Reported on 07/04/2022)     No current facility-administered medications on file prior to visit.    Allergies: No Known Allergies  Current Problems (verified) has Hyperlipidemia, mixed; Former smoker (45 pack year, quit 04/2020); ED (erectile dysfunction); Right bundle branch block (RBBB) with left anterior fascicular block (LAFB); Essential hypertension; Vitamin D deficiency; Medication management; CKD (chronic kidney disease) stage 2, GFR 60-89 ml/min; Gastroesophageal reflux disease; Overweight (BMI 25.0-29.9); Acute medial meniscus tear of right knee; Primary osteoarthritis of right knee; and Aortic atherosclerosis by Chest CT 02/13/2020 on their problem list.  Screening Tests Immunization History  Administered Date(s) Administered   Influenza Split 07/05/2014   Influenza, High Dose Seasonal PF 08/21/2015, 08/09/2019, 08/28/2020, 07/04/2021, 07/04/2022   PFIZER(Purple Top)SARS-COV-2 Vaccination 01/26/2020, 02/20/2020, 09/15/2020   Pneumococcal Conjugate-13 05/20/2016   Pneumococcal Polysaccharide-23 10/06/2012, 11/14/2019   Td 09/29/2006, 07/10/2017   Zoster, Live 08/21/2015   Preventative care: Last colonoscopy: 2008  Cologuard: 08/2021 Negative.  Due 2025  Smoker - CT lung 02/13/2022, + aortic atherosclerosis and emphysema  Prior vaccinations: TD or Tdap: 2018  Influenza: Administered 06/2022 Pneumococcal: 2014, 2021 Prevnar13: 2017 Shingles/Zostavax: 2016 Covid 19: 1/2, 2021, pfizer has next scheduled on Monday   Names of Other Physician/Practitioners you currently use: 1. [facility] here for primary care 2. Dr. Delman Cheadle, eye doctor, last visit 2022, goes q12 months, no  issues  3. Dr. Lavone Neri, dentist, last visit 2022, goes q70m  Patient Care Team: MUnk Pinto MD as PCP - General (Internal Medicine) SErline Levine MD as Consulting Physician (Neurosurgery) SLadene Artist MD as Consulting Physician (Gastroenterology) GSharyne Peach MD as Consulting Physician (Ophthalmology) HMinus Breeding MD as Consulting Physician (Cardiology) LDruscilla Brownie MD as Consulting Physician (Dermatology)  Surgical: He  has a past surgical history that includes Uvuectomy; Appendectomy; Vasectomy; Adenoidectomy; Spine surgery (Aug 2016); Dental surgery; and Laparoscopic cholecystectomy (05/2020). Family His family history includes Glaucoma in his mother; Heart disease in his father; Heart disease (age of onset: 64 in his paternal grandfather; Hyperlipidemia in his father; Hypertension in his father  and mother; Osteoporosis in his mother; Thyroid disease in his father and mother. Social history  He reports that he quit smoking about 2 years ago. His smoking use included cigarettes. He has a 45.00 pack-year smoking history. He has never used smokeless tobacco. He reports current alcohol use of about 20.0 standard drinks of alcohol per week. He reports that he does not use drugs.  MEDICARE WELLNESS OBJECTIVES: Physical activity:   Cardiac risk factors:   Depression/mood screen:      07/04/2022    9:44 AM  Depression screen PHQ 2/9  Decreased Interest 0  Down, Depressed, Hopeless 0  PHQ - 2 Score 0    ADLs:     07/04/2022    9:44 AM 12/10/2021   11:59 PM  In your present state of health, do you have any difficulty performing the following activities:  Hearing? 0 0  Vision? 0 0  Difficulty concentrating or making decisions? 0 0  Walking or climbing stairs? 0 0  Dressing or bathing? 0 0  Doing errands, shopping? 0 0  Preparing Food and eating ? N   Using the Toilet? N   In the past six months, have you accidently leaked urine? N   Do you have problems with  loss of bowel control? N   Managing your Medications? N   Managing your Finances? N   Housekeeping or managing your Housekeeping? N      Cognitive Testing  Alert? Yes  Normal Appearance?Yes  Oriented to person? Yes  Place? Yes   Time? Yes  Recall of three objects?  Yes  Can perform simple calculations? Yes  Displays appropriate judgment?Yes  Can read the correct time from a watch face?Yes  EOL planning: Does Patient Have a Medical Advance Directive?: Yes  Review of Systems  Constitutional:  Negative for chills, fever and weight loss.  HENT:  Negative for congestion and hearing loss.   Eyes:  Negative for blurred vision and double vision.  Respiratory:  Negative for cough and shortness of breath.   Cardiovascular:  Negative for chest pain, palpitations, orthopnea and leg swelling.  Gastrointestinal:  Negative for abdominal pain, constipation, diarrhea, heartburn, nausea and vomiting.  Genitourinary:        ED worsening  Musculoskeletal:  Positive for back pain and myalgias (hamstrings bilaterally). Negative for falls and joint pain.  Skin:  Negative for rash.  Neurological:  Negative for dizziness, tingling, tremors, loss of consciousness and headaches.  Endo/Heme/Allergies:  Does not bruise/bleed easily.  Psychiatric/Behavioral:  Negative for depression, memory loss and suicidal ideas.     Objective:   Today's Vitals   07/04/22 0904  BP: 110/60  Pulse: 74  Temp: (!) 97.3 F (36.3 C)  SpO2: 95%  Weight: 220 lb (99.8 kg)  Height: '6\' 2"'  (1.88 m)    Body mass index is 28.25 kg/m.  General Appearance: Well nourished, in no apparent distress. Eyes: PERRLA, EOMs, conjunctiva no swelling or erythema Sinuses: No Frontal/maxillary tenderness ENT/Mouth: Ext aud canals clear, TMs without erythema, bulging. No erythema, swelling, or exudate on post pharynx.  Tonsils not swollen or erythematous. Hearing normal.  Neck: Supple, thyroid normal.  Respiratory: Respiratory effort  normal, BS equal bilaterally without rales, rhonchi, wheezing or stridor.  Cardio: RRR with no MRGs. Brisk peripheral pulses without edema.  Abdomen: Soft, + BS.  Non tender, no guarding, rebound, hernias, masses. Lymphatics: Non tender without lymphadenopathy.  Musculoskeletal: Full ROM, 5/5 strength, Normal gait Skin: Warm, dry without rashes, lesions, ecchymosis.  Neuro: Cranial nerves intact. No cerebellar symptoms.  Psych: Awake and oriented X 3, normal affect, Insight and Judgment appropriate.    Medicare Attestation I have personally reviewed: The patient's medical and social history Their use of alcohol, tobacco or illicit drugs Their current medications and supplements The patient's functional ability including ADLs,fall risks, home safety risks, cognitive, and hearing and visual impairment Diet and physical activities Evidence for depression or mood disorders  The patient's weight, height, BMI, and visual acuity have been recorded in the chart.  I have made referrals, counseling, and provided education to the patient based on review of the above and I have provided the patient with a written personalized care plan for preventive services.     Darrol Jump, NP [facility]  07/04/2022

## 2022-07-05 LAB — COMPLETE METABOLIC PANEL WITH GFR
AG Ratio: 1.6 (calc) (ref 1.0–2.5)
ALT: 18 U/L (ref 9–46)
AST: 20 U/L (ref 10–35)
Albumin: 4.2 g/dL (ref 3.6–5.1)
Alkaline phosphatase (APISO): 73 U/L (ref 35–144)
BUN: 17 mg/dL (ref 7–25)
CO2: 32 mmol/L (ref 20–32)
Calcium: 9.7 mg/dL (ref 8.6–10.3)
Chloride: 99 mmol/L (ref 98–110)
Creat: 1.28 mg/dL (ref 0.70–1.28)
Globulin: 2.6 g/dL (calc) (ref 1.9–3.7)
Glucose, Bld: 130 mg/dL — ABNORMAL HIGH (ref 65–99)
Potassium: 4.7 mmol/L (ref 3.5–5.3)
Sodium: 137 mmol/L (ref 135–146)
Total Bilirubin: 0.5 mg/dL (ref 0.2–1.2)
Total Protein: 6.8 g/dL (ref 6.1–8.1)
eGFR: 60 mL/min/{1.73_m2} (ref >=60)

## 2022-07-07 ENCOUNTER — Telehealth: Payer: Self-pay | Admitting: Internal Medicine

## 2022-07-07 NOTE — Chronic Care Management (AMB) (Signed)
  Chronic Care Management   Outreach Note  07/07/2022 Name: Arthur Garcia MRN: [id number] DOB: September 07, 1951  Referred by: Unk Pinto, MD Reason for referral : No chief complaint on file.   An unsuccessful telephone outreach was attempted today. The patient was referred to the pharmacist for assistance with care management and care coordination.   Follow Up Plan:   Tatjana Dellinger Upstream Scheduler

## 2022-07-17 ENCOUNTER — Telehealth: Payer: Self-pay | Admitting: Internal Medicine

## 2022-07-17 NOTE — Chronic Care Management (AMB) (Signed)
  Chronic Care Management   Outreach Note  07/17/2022 Name: Arthur Garcia MRN: [id number] DOB: [birth year]  Referred by: Unk Pinto, MD Reason for referral : No chief complaint on file.   A second unsuccessful telephone outreach was attempted today. The patient was referred to pharmacist for assistance with care management and care coordination.  Follow Up Plan:   Tatjana Dellinger Upstream Scheduler

## 2022-07-28 ENCOUNTER — Other Ambulatory Visit: Payer: Self-pay

## 2022-07-28 DIAGNOSIS — L820 Inflamed seborrheic keratosis: Secondary | ICD-10-CM | POA: Diagnosis not present

## 2022-07-28 DIAGNOSIS — L814 Other melanin hyperpigmentation: Secondary | ICD-10-CM | POA: Diagnosis not present

## 2022-07-28 DIAGNOSIS — Z08 Encounter for follow-up examination after completed treatment for malignant neoplasm: Secondary | ICD-10-CM | POA: Diagnosis not present

## 2022-07-28 DIAGNOSIS — R208 Other disturbances of skin sensation: Secondary | ICD-10-CM | POA: Diagnosis not present

## 2022-07-28 DIAGNOSIS — Z85828 Personal history of other malignant neoplasm of skin: Secondary | ICD-10-CM | POA: Diagnosis not present

## 2022-07-28 DIAGNOSIS — L538 Other specified erythematous conditions: Secondary | ICD-10-CM | POA: Diagnosis not present

## 2022-07-28 DIAGNOSIS — D225 Melanocytic nevi of trunk: Secondary | ICD-10-CM | POA: Diagnosis not present

## 2022-07-28 DIAGNOSIS — L918 Other hypertrophic disorders of the skin: Secondary | ICD-10-CM | POA: Diagnosis not present

## 2022-07-28 MED ORDER — ATORVASTATIN CALCIUM 20 MG PO TABS: ORAL_TABLET | ORAL | 1 refills | Status: DC

## 2022-08-27 NOTE — Progress Notes (Unsigned)
     Future Appointments  Date Time Provider Department  08/28/2022 11:30 AM Unk Pinto, MD [facility]  12/17/2022 11:00 AM Unk Pinto, MD [facility]  07/06/2023  9:00 AM Darrol Jump, NP [facility]    History of Present Illness:      This very nice 71 y.o.  MWM with HTN, HLD, Pre-Diabetes and Vitamin D Deficiency who presents with 3-4 month hx/o Lateral Lt ankle discomfort . No known injury. Ambulation worsens .    Medications     atorvastatin (LIPITOR) 20 MG tablet, TAKE 1 TABLET  EVERY DAY FOR CHOLESTEROL   losartan (COZAAR) 50 MG tablet, Take 1 tablet  Daily  for BP   tadalafil (CIALIS) 20 MG tablet, Take 1/2-1 tablet every 2-3 days as needed   Acetaminophen, Take by mouth. Take two tablets daily in the morning   traMADol (ULTRAM) 50 MG tablet, Take by mouth every 6 (six) hours as needed.   Cholecalciferol (VITAMIN D-3) 5000 UNITS TABS, Take by mouth daily. Alternates between 5000 and 10000   methocarbamol (ROBAXIN) 500 MG tablet, Take 500 mg by mouth daily as needed. (Patient not taking: Reported on 07/04/2022)   Multiple Vitamins-Minerals (CENTRUM SILVER PO), Take by mouth daily.   traZODone (DESYREL) 100 MG tablet, Take  1 tablet 1-2 hours  before Bedtime  as needed for Sleep  Problem list He has Hyperlipidemia, mixed; Former smoker (45 pack year, quit 04/2020); ED (erectile dysfunction); Right bundle branch block (RBBB) with left anterior fascicular block (LAFB); Essential hypertension; Vitamin D deficiency; Medication management; CKD (chronic kidney disease) stage 2, GFR 60-89 ml/min; Gastroesophageal reflux disease; Overweight (BMI 25.0-29.9); Acute medial meniscus tear of right knee; Primary osteoarthritis of right knee; and Aortic atherosclerosis by Chest CT 02/13/2020 on their problem list.   Observations/Objective:  BP (!) 148/70   Pulse 61   Temp (!) 97.1 F (36.2 C)   Ht '6\' 2"'$  (1.88 m)   Wt 212 lb (96.2 kg)   SpO2 94%   BMI 27.22 kg/m   Nl Lt.  ankle ROM - flexion /extension. Point tender  just anterior  & inferior to Lt lateral malleolus.    Assessment and Plan:   1. Pain in lateral portion of left ankle  1st - dexamethasone 4 MG tablet;  Take 1 tab 3 x day - 3 days, then 2 x day - 3 days, then 1 tab daily   Dispense: 20 tablet  Then if needed  - meloxicam 15 MG tablet;  Take  1/2 to 1 tablet  Daily  WITH FOOD  for Pain & Inflammation   Dispense: 90 tablet; Refill: 3  - Recommend Velcro elastic Ankle Wrap    Follow Up Instructions:        I discussed the assessment and treatment plan with the patient. The patient was provided an opportunity to ask questions and all were answered. The patient agreed with the plan and demonstrated an understanding of the instructions.       The patient was advised to call back or seek an in-person evaluation if the symptoms worsen or if the condition fails to improve as anticipated.    Kirtland Bouchard, MD

## 2022-08-28 ENCOUNTER — Encounter: Payer: Self-pay | Admitting: Internal Medicine

## 2022-08-28 ENCOUNTER — Ambulatory Visit (INDEPENDENT_AMBULATORY_CARE_PROVIDER_SITE_OTHER): Payer: Medicare Other | Admitting: Internal Medicine

## 2022-08-28 VITALS — BP 148/70 | HR 61 | Temp 97.1°F | Ht 74.0 in | Wt 212.0 lb

## 2022-08-28 DIAGNOSIS — M25572 Pain in left ankle and joints of left foot: Secondary | ICD-10-CM | POA: Diagnosis not present

## 2022-08-28 MED ORDER — DEXAMETHASONE 4 MG PO TABS: ORAL_TABLET | ORAL | 0 refills | Status: DC

## 2022-08-28 MED ORDER — MELOXICAM 15 MG PO TABS: ORAL_TABLET | ORAL | 3 refills | Status: DC

## 2022-09-08 ENCOUNTER — Encounter: Payer: Self-pay | Admitting: Nurse Practitioner

## 2022-09-08 ENCOUNTER — Ambulatory Visit: Payer: Medicare Other | Admitting: Nurse Practitioner

## 2022-09-08 DIAGNOSIS — U071 COVID-19: Secondary | ICD-10-CM | POA: Diagnosis not present

## 2022-09-08 MED ORDER — PROMETHAZINE-DM 6.25-15 MG/5ML PO SYRP: 5.0000 mL | ORAL_SOLUTION | Freq: Four times a day (QID) | ORAL | 1 refills | Status: DC | PRN

## 2022-09-08 MED ORDER — AZITHROMYCIN 250 MG PO TABS: ORAL_TABLET | ORAL | 1 refills | Status: DC

## 2022-09-08 MED ORDER — DEXAMETHASONE 1 MG PO TABS: ORAL_TABLET | ORAL | 0 refills | Status: DC

## 2022-09-08 NOTE — Patient Instructions (Signed)
Take tylenol PRN temp 101+ Push hydration Regular ambulation or calf exercises exercises for clot prevention and 81 mg ASA unless contraindicated Sx supportive therapy suggested Follow up via mychart or telephone if needed Advised patient obtain O2 monitor; present to ED if persistently <90% or with severe dyspnea, CP, fever uncontrolled by tylenol, confusion, sudden decline Should remain in isolation until at least 5 days from testing positive and then the following 5 days wear a mask anytime around other people

## 2022-09-08 NOTE — Progress Notes (Signed)
THIS ENCOUNTER IS A VIRTUAL VISIT DUE TO COVID-19 - PATIENT WAS NOT SEEN IN THE OFFICE.  PATIENT HAS CONSENTED TO VIRTUAL VISIT / TELEMEDICINE VISIT   Virtual Visit via telephone Note  I connected with  Arthur Garcia on 09/08/2022 by telephone.  I verified that I am speaking with the correct person using two identifiers.    I discussed the limitations of evaluation and management by telemedicine and the availability of in person appointments. The patient expressed understanding and agreed to proceed.  History of Present Illness:  There were no vitals taken for this visit. 72 y.o. patient contacted office reporting URI sx . he tested positive by home test. OV was conducted by telephone to minimize exposure. This patient was vaccinated for covid 19, last 08/2020     Sx began 7 days ago with congestion but productive cough of yellow mucus began 3 days ago.  He has noticed some headache and fatigue  Treatments tried so far: sudafed  Exposures: unknown   Medications  Current Outpatient Medications (Endocrine & Metabolic):    dexamethasone (DECADRON) 4 MG tablet, Take 1 tab 3 x day - 3 days, then 2 x day - 3 days, then 1 tab daily  Current Outpatient Medications (Cardiovascular):    atorvastatin (LIPITOR) 20 MG tablet, TAKE 1 TABLET BY MOUTH EVERY DAY FOR CHOLESTEROL   losartan (COZAAR) 50 MG tablet, Take 1 tablet  Daily  for BP   tadalafil (CIALIS) 20 MG tablet, Take 1/2-1 tablet every 2-3 days as needed for XXXX                                  /                          TAKE                   BY                       MOUTH   Current Outpatient Medications (Analgesics):    Acetaminophen (TYLENOL PO), Take by mouth. Take two tablets daily in the morning (Patient not taking: Reported on 09/08/2022)   meloxicam (MOBIC) 15 MG tablet, Take  1/2 to 1 tablet  Daily  WITH FOOD  for Pain & Inflammation (Patient not taking: Reported on 09/08/2022)   traMADol (ULTRAM) 50 MG tablet, Take by  mouth every 6 (six) hours as needed. Take one tablet daily (Patient not taking: Reported on 09/08/2022)   Current Outpatient Medications (Other):    Cholecalciferol (VITAMIN D-3) 5000 UNITS TABS, Take by mouth daily. Alternates between 5000 and 10000   Multiple Vitamins-Minerals (CENTRUM SILVER PO), Take by mouth daily.   traZODone (DESYREL) 100 MG tablet, Take  1 tablet 1-2 hours  before Bedtime  as needed for Sleep  Allergies: No Known Allergies  Problem list He has Hyperlipidemia, mixed; Former smoker (45 pack year, quit 04/2020); ED (erectile dysfunction); Right bundle branch block (RBBB) with left anterior fascicular block (LAFB); Essential hypertension; Vitamin D deficiency; Medication management; CKD (chronic kidney disease) stage 2, GFR 60-89 ml/min; Gastroesophageal reflux disease; Overweight (BMI 25.0-29.9); Acute medial meniscus tear of right knee; Primary osteoarthritis of right knee; and Aortic atherosclerosis by Chest CT 02/13/2020 on their problem list.   Social History:   reports that he quit smoking about 2 years ago. His  smoking use included cigarettes. He has a 45.00 pack-year smoking history. He has never used smokeless tobacco. He reports current alcohol use of about 20.0 standard drinks of alcohol per week. He reports that he does not use drugs.  Observations/Objective:  General : Well sounding patient in no apparent distress HEENT: no hoarseness, no cough for duration of visit Lungs: speaks in complete sentences, no audible wheezing, no apparent distress Neurological: alert, oriented x 3 Psychiatric: pleasant, judgement appropriate   Assessment and Plan:  Covid 19 Covid 19 positive per rapid screening test at home Risk factors include: has Hyperlipidemia, mixed; Former smoker (45 pack year, quit 04/2020); ED (erectile dysfunction); Right bundle branch block (RBBB) with left anterior fascicular block (LAFB); Essential hypertension; Vitamin D deficiency; Medication  management; CKD (chronic kidney disease) stage 2, GFR 60-89 ml/min; Gastroesophageal reflux disease; Overweight (BMI 25.0-29.9); Acute medial meniscus tear of right knee; Primary osteoarthritis of right knee; and Aortic atherosclerosis by Chest CT 02/13/2020 on their problem list.  Symptoms are: mild Immue support reviewed  Take tylenol PRN temp 101+ Push hydration Regular ambulation or calf exercises exercises for clot prevention and 81 mg ASA unless contraindicated Sx supportive therapy suggested Follow up via mychart or telephone if needed Advised patient obtain O2 monitor; present to ED if persistently <90% or with severe dyspnea, CP, fever uncontrolled by tylenol, confusion, sudden decline Should remain in isolation until at least 5 days from testing positive and then the following 5 days wear a mask anytime around other people  [name] was seen today for covid positive.  Diagnoses and all orders for this visit:  COVID-19 -     promethazine-dextromethorphan (PROMETHAZINE-DM) 6.25-15 MG/5ML syrup; Take 5 mLs by mouth 4 (four) times daily as needed for cough. -     azithromycin (ZITHROMAX) 250 MG tablet; Take 2 tablets (500 mg) on  Day 1,  followed by 1 tablet (250 mg) once daily on Days 2 through 5. -     dexamethasone (DECADRON) 1 MG tablet; Take 3 tabs for 3 days, 2 tabs for 3 days 1 tab for 5 days. Take with food.     Follow Up Instructions:  I discussed the assessment and treatment plan with the patient. The patient was provided an opportunity to ask questions and all were answered. The patient agreed with the plan and demonstrated an understanding of the instructions.   The patient was advised to call back or seek an in-person evaluation if the symptoms worsen or if the condition fails to improve as anticipated.  I provided 20 minutes of non-face-to-face time during this encounter.   Alycia Rossetti, NP

## 2022-11-10 ENCOUNTER — Telehealth: Payer: Self-pay | Admitting: Internal Medicine

## 2022-11-10 NOTE — Progress Notes (Signed)
  Chronic Care Management   Note  11/10/2022 Name: Arthur Garcia MRN: [id number] DOB: Aug 03, 1951  [name] is a 72 y.o. year old male who is a primary care patient of Unk Pinto, MD. I reached out to Earnestine Mealing by phone today in response to a referral sent by [name] PCP, Unk Pinto, MD.   [name] was given information about Chronic Care Management services today including:  CCM service includes personalized support from designated clinical staff supervised by his physician, including individualized plan of care and coordination with other care providers 24/7 contact phone numbers for assistance for urgent and routine care needs. Service will only be billed when office clinical staff spend 20 minutes or more in a month to coordinate care. Only one practitioner may furnish and bill the service in a calendar month. The patient may stop CCM services at any time (effective at the end of the month) by phone call to the office staff.   Patient agreed to services and verbal consent obtained.   Follow up plan:   Tatjana Secretary/administrator

## 2022-11-13 ENCOUNTER — Telehealth: Payer: Self-pay

## 2022-11-13 NOTE — Progress Notes (Signed)
Review office visits, consults, Medication changes and Chronic conditions for Initial CCM visit with CPP on 11/18/21 '@10'$ :30PM.   PCP  09/08/22 Carlye Grippe, NP (PCP) COVID-19 - Start: Azithromycin '250mg'$  - 2 tabs on day 1 then 1 tab once daily for days 2-5. Promethazine-DM 6.25-'15mg'$ /5ML - 5 MLs 4 times daily PRN. Change: Dexamethasone '1mg'$  - 3 tabs daily for 3 days, then 2 tabs daily for 3 days, then 1 tab daily for 5 days.  08/28/22 Dr. Melford Aase (PCP) Ankle pain - BP 148/70 HR 61. Start: Dexamethasone '4mg'$  - 1 tab 3 times daily x3 days, then 1 tab twice daily x3days, then 1 tab daily. Meloxicam '15mg'$  - 0.5-1 tab once daily. Pt reported not taking Methocarbamol.  07/04/22 Cranford, NP (PCP) Wellness - BP 110/60 HR 74. no med changes  05/30/22 Cranford, NP (PCP) Dizziness - BP 146/80 HR 70. no med changes  Specialist Visits 08/19/22 Shearon Stalls, [place] (Neuro) unable to see visit notes, outside information  05/26/22 Shearon Stalls, [place] (Neuro) unable to see visit notes, outside information  Hospital Visits in last 6 months 05/28/22 - [facility] - Dizziness. no med changes  PreCall Questions Date: 11/13/2022 Time:AM Outcome: Successful Confirmed appointment date/time with patient/caregiver?: Yes, 2/20 '@1030AM'$  in clinic Patient/Caregiver instructed to bring medications to appointment: Yes What, if any, problems do you have getting your medications from the pharmacy?: None What is your top health concern to discuss at your upcoming visit?: None  Time Spent: 40 min Shon Hough, [place]

## 2022-11-18 ENCOUNTER — Ambulatory Visit: Payer: Medicare Other | Admitting: Pharmacist

## 2022-11-18 ENCOUNTER — Other Ambulatory Visit: Payer: Self-pay | Admitting: Internal Medicine

## 2022-12-17 ENCOUNTER — Ambulatory Visit (INDEPENDENT_AMBULATORY_CARE_PROVIDER_SITE_OTHER): Payer: Medicare Other | Admitting: Internal Medicine

## 2022-12-17 ENCOUNTER — Encounter: Payer: Self-pay | Admitting: Internal Medicine

## 2022-12-17 VITALS — BP 136/86 | HR 70 | Temp 97.9°F | Resp 16 | Ht 74.0 in | Wt 216.6 lb

## 2022-12-17 DIAGNOSIS — Z8249 Family history of ischemic heart disease and other diseases of the circulatory system: Secondary | ICD-10-CM

## 2022-12-17 DIAGNOSIS — Z125 Encounter for screening for malignant neoplasm of prostate: Secondary | ICD-10-CM

## 2022-12-17 DIAGNOSIS — Z1211 Encounter for screening for malignant neoplasm of colon: Secondary | ICD-10-CM

## 2022-12-17 DIAGNOSIS — Z122 Encounter for screening for malignant neoplasm of respiratory organs: Secondary | ICD-10-CM

## 2022-12-17 DIAGNOSIS — Z136 Encounter for screening for cardiovascular disorders: Secondary | ICD-10-CM | POA: Diagnosis not present

## 2022-12-17 DIAGNOSIS — I10 Essential (primary) hypertension: Secondary | ICD-10-CM | POA: Diagnosis not present

## 2022-12-17 DIAGNOSIS — N401 Enlarged prostate with lower urinary tract symptoms: Secondary | ICD-10-CM

## 2022-12-17 DIAGNOSIS — Z79899 Other long term (current) drug therapy: Secondary | ICD-10-CM | POA: Diagnosis not present

## 2022-12-17 DIAGNOSIS — I700 Atherosclerosis of aorta: Secondary | ICD-10-CM | POA: Diagnosis not present

## 2022-12-17 DIAGNOSIS — E782 Mixed hyperlipidemia: Secondary | ICD-10-CM

## 2022-12-17 DIAGNOSIS — R7309 Other abnormal glucose: Secondary | ICD-10-CM

## 2022-12-17 DIAGNOSIS — E559 Vitamin D deficiency, unspecified: Secondary | ICD-10-CM | POA: Diagnosis not present

## 2022-12-17 DIAGNOSIS — Z87891 Personal history of nicotine dependence: Secondary | ICD-10-CM

## 2022-12-17 NOTE — Progress Notes (Signed)
Comprehensive Evaluation & Examination  Future Appointments  Date Time Provider Department  12/17/2022 11:00 AM Unk Pinto, MD [facility]  07/06/2023  9:00 AM Darrol Jump, NP [facility]  12/22/2023 11:00 AM Unk Pinto, MD [facility]              This very nice 72 y.o. MWM presents for a comprehensive evaluation and management of multiple medical co-morbidities.  Patient has been followed for HTN, HLD, T2_NIDDM  Prediabetes and Vitamin D Deficiency. Chest CT in 2021 showed Aortic Atherosclerosis.                                   Patient has a 40+pk year smoking history.  We discussed lung cancer screening.   He alleges quitting smoking May 08, 2020.  Last year LD screening Lung CT scan (02/14/2022) was Negative. He was agreeable to undergo a screening low dose CT scan of the chest.   I will refer for a LDCT lung scan.                              HTN predates since 2001. Patient's BP has been controlled at home.  Today's BP is                   . Patient denies any cardiac symptoms as chest pain, palpitations, shortness of breath, dizziness or ankle swelling.       Patient's hyperlipidemia is controlled with diet and medications. Patient denies myalgias or other medication SE's. Last lipids were at goal :  Lab Results  Component Value Date   CHOL 129 03/25/2022   HDL 42 03/25/2022   LDLCALC 69 03/25/2022   TRIG 99 03/25/2022   CHOLHDL 3.1 03/25/2022         Patient has hx/o prediabetes (A1c 5.9% /2015) and patient denies reactive hypoglycemic symptoms, visual blurring, diabetic polys or paresthesias. Last A1c was normal & at goal :   Lab Results  Component Value Date   HGBA1C 5.5 03/25/2022          Finally, patient has history of Vitamin D Deficiency of    and last vitamin D was sat goal :   Lab Results  Component Value Date   VD25OH 95 03/25/2022       Current Outpatient Medications  Medication Instructions   atorvastatin  20 MG tablet TAKE 1  TABLET  EVERY DAY    VITAMIN D  5000 UNITS  Oral, Daily, Alternates between 5000 and 10000    losartan (COZAAR) 50 MG tablet Take 1 tablet  Daily  for BP   Multiple Vitamins-Minerals  Takes  Daily   Tadalafil 20 MG tablet TAKE 1/2 TO 1 TABLET EVERY 2 TO 3 DAYS AS NEEDED    traZODone 100 MG tablet Take  1 tablet 1-2 hours  before Bedtime  as needed      No Known Allergies   Past Medical History:  Diagnosis Date   Abnormal liver function test    Anxiety    Colon polyp    DDD (degenerative disc disease)    ED (erectile dysfunction) 04/05/2014   FHx: heart disease 01/28/2018   GERD (gastroesophageal reflux disease)    Hyperlipidemia    Hypertension    Insomnia    Tobacco dependence      Health Maintenance  Topic Date Due  Zoster Vaccines- Shingrix (1 of 2) Never done   COVID-19 Vaccine (4 - Booster for Pfizer series) 11/10/2020   Fecal DNA (Cologuard)  09/07/2024   TETANUS/TDAP  07/11/2027   Pneumonia Vaccine 43+ Years old  Completed   INFLUENZA VACCINE  Completed   Hepatitis C Screening  Completed   HPV VACCINES  Aged Out     Immunization History  Administered Date(s) Administered   Influenza Split 07/05/2014   Influenza, High Dose 08/09/2019, 08/28/2020, 07/04/2021   PFIZER SARS-COV-2 Vacc 01/26/2020, 02/20/2020, 09/15/2020   Pneumococcal -13 05/20/2016   Pneumococcal -23 10/06/2012, 11/14/2019   Td 09/29/2006, 07/10/2017   Zoster, Live 08/21/2015     Cologard - 09/07/2021 - Neg due 3 year f/u Dec 2025   Past Surgical History:  Procedure Laterality Date   ADENOIDECTOMY     age 62   [facility] implants   LAPAROSCOPIC CHOLECYSTECTOMY  05/2020   At surgery center, Dr. Nonie Hoyer SURGERY  Aug 2016   Dr. Boykin Nearing     VASECTOMY       Family History  Problem Relation Age of Onset   Thyroid disease Mother    Hypertension Mother    Osteoporosis Mother    Glaucoma Mother    Thyroid disease Father     Hypertension Father    Hyperlipidemia Father    Heart disease Father        CHF   Heart disease Paternal Grandfather 52       Long standing "heart trouble"     Social History   Tobacco Use   Smoking status: Former    Packs/day: 1.00    Years: 45.00    Pack years: 45.00    Types: Cigarettes    Quit date: 05/08/2020    Years since quitting: 1.5   Smokeless tobacco: Never   Tobacco comments:    quit using chantix   Substance Use Topics   Alcohol use: Yes    Alcohol/week: 20.0 standard drinks    Types: 20 Cans of beer per week   Drug use: No      ROS Constitutional: Denies fever, chills, weight loss/gain, headaches, insomnia,  night sweats or change in appetite. Does c/o fatigue. Eyes: Denies redness, blurred vision, diplopia, discharge, itchy or watery eyes.  ENT: Denies discharge, congestion, post nasal drip, epistaxis, sore throat, earache, hearing loss, dental pain, Tinnitus, Vertigo, Sinus pain or snoring.  Cardio: Denies chest pain, palpitations, irregular heartbeat, syncope, dyspnea, diaphoresis, orthopnea, PND, claudication or edema Respiratory: denies cough, dyspnea, DOE, pleurisy, hoarseness, laryngitis or wheezing.  Gastrointestinal: Denies dysphagia, heartburn, reflux, water brash, pain, cramps, nausea, vomiting, bloating, diarrhea, constipation, hematemesis, melena, hematochezia, jaundice or hemorrhoids Genitourinary: Denies dysuria, frequency, discharge, hematuria or flank pain. Has urgency, nocturia x 2-3 & occasional hesitancy. Musculoskeletal: Denies arthralgia, myalgia, stiffness, Jt. Swelling, pain, limp or strain/sprain. Denies Falls. Skin: Denies puritis, rash, hives, warts, acne, eczema or change in skin lesion Neuro: No weakness, tremor, incoordination, spasms, paresthesia or pain Psychiatric: Denies confusion, memory loss or sensory loss. Denies Depression. Endocrine: Denies change in weight, skin, hair change, nocturia, and paresthesia, diabetic polys,  visual blurring or hyper / hypo glycemic episodes.  Heme/Lymph: No excessive bleeding, bruising or enlarged lymph nodes.   Physical Exam  There were no vitals taken for this visit.  General Appearance: Well nourished and well groomed and in no apparent distress.  Eyes: PERRLA, EOMs, conjunctiva no  swelling or erythema, normal fundi and vessels. Sinuses: No frontal/maxillary tenderness ENT/Mouth: EACs patent / TMs  nl. Nares clear without erythema, swelling, mucoid exudates. Oral hygiene is good. No erythema, swelling, or exudate. Tongue normal, non-obstructing. Tonsils not swollen or erythematous. Hearing normal.  Neck: Supple, thyroid not palpable. No bruits, nodes or JVD. Respiratory: Respiratory effort normal.  BS equal and clear bilateral without rales, rhonci, wheezing or stridor. Cardio: Heart sounds are normal with regular rate and rhythm and no murmurs, rubs or gallops. Peripheral pulses are normal and equal bilaterally without edema. No aortic or femoral bruits. Chest: symmetric with normal excursions and percussion.  Abdomen: Soft, with Nl bowel sounds. Nontender, no guarding, rebound, hernias, masses, or organomegaly.  Lymphatics: Non tender without lymphadenopathy.  Musculoskeletal: Full ROM all peripheral extremities, joint stability, 5/5 strength, and normal gait. Skin: Warm and dry without rashes, lesions, cyanosis, clubbing or  ecchymosis.  Neuro: Cranial nerves intact, reflexes equal bilaterally. Normal muscle tone, no cerebellar symptoms. Sensation intact.  Pysch: Alert and oriented X 3 with normal affect, insight and judgment appropriate.   Assessment and Plan          Patient was counseled in prudent diet, weight control to achieve/maintain BMI less than 25, BP monitoring, regular exercise and medications as discussed.  Discussed med effects and SE's. Routine screening labs and tests as requested with regular follow-up as recommended. [name],  counseling, chart review and high complex critical decision making was performed   Kirtland Bouchard, MD

## 2022-12-17 NOTE — Patient Instructions (Signed)

## 2022-12-18 LAB — CBC WITH DIFFERENTIAL/PLATELET
Absolute Monocytes: 414 cells/uL (ref 200–950)
Basophils Absolute: 59 cells/uL (ref 0–200)
Basophils Relative: 1.3 %
Eosinophils Absolute: 113 cells/uL (ref 15–500)
Eosinophils Relative: 2.5 %
HCT: 41.4 % (ref 38.5–50.0)
Hemoglobin: 14.1 g/dL (ref 13.2–17.1)
Lymphs Abs: 1067 cells/uL (ref 850–3900)
MCH: 30.3 pg (ref 27.0–33.0)
MCHC: 34.1 g/dL (ref 32.0–36.0)
MCV: 88.8 fL (ref 80.0–100.0)
MPV: 10.2 fL (ref 7.5–12.5)
Monocytes Relative: 9.2 %
Neutro Abs: 2849 cells/uL (ref 1500–7800)
Neutrophils Relative %: 63.3 %
Platelets: 241 10*3/uL (ref 140–400)
RBC: 4.66 10*6/uL (ref 4.20–5.80)
RDW: 11.9 % (ref 11.0–15.0)
Total Lymphocyte: 23.7 %
WBC: 4.5 10*3/uL (ref 3.8–10.8)

## 2022-12-18 LAB — URINALYSIS, ROUTINE W REFLEX MICROSCOPIC
Bilirubin Urine: NEGATIVE
Glucose, UA: NEGATIVE
Hgb urine dipstick: NEGATIVE
Ketones, ur: NEGATIVE
Leukocytes,Ua: NEGATIVE
Nitrite: NEGATIVE
Protein, ur: NEGATIVE
Specific Gravity, Urine: 1.012 (ref 1.001–1.035)
pH: 6 (ref 5.0–8.0)

## 2022-12-18 LAB — COMPLETE METABOLIC PANEL WITH GFR
AG Ratio: 2 (calc) (ref 1.0–2.5)
ALT: 12 U/L (ref 9–46)
AST: 16 U/L (ref 10–35)
Albumin: 4.6 g/dL (ref 3.6–5.1)
Alkaline phosphatase (APISO): 60 U/L (ref 35–144)
BUN: 18 mg/dL (ref 7–25)
CO2: 26 mmol/L (ref 20–32)
Calcium: 9.8 mg/dL (ref 8.6–10.3)
Chloride: 102 mmol/L (ref 98–110)
Creat: 1.15 mg/dL (ref 0.70–1.28)
Globulin: 2.3 g/dL (calc) (ref 1.9–3.7)
Glucose, Bld: 134 mg/dL — ABNORMAL HIGH (ref 65–99)
Potassium: 4.4 mmol/L (ref 3.5–5.3)
Sodium: 138 mmol/L (ref 135–146)
Total Bilirubin: 0.5 mg/dL (ref 0.2–1.2)
Total Protein: 6.9 g/dL (ref 6.1–8.1)
eGFR: 68 mL/min/{1.73_m2} (ref >=60)

## 2022-12-18 LAB — LIPID PANEL
Cholesterol: 123 mg/dL (ref <200)
HDL: 49 mg/dL (ref >=40)
LDL Cholesterol (Calc): 56 mg/dL (calc)
Non-HDL Cholesterol (Calc): 74 mg/dL (calc) (ref <130)
Total CHOL/HDL Ratio: 2.5 (calc) (ref <5.0)
Triglycerides: 93 mg/dL (ref <150)

## 2022-12-18 LAB — HEMOGLOBIN A1C
Hgb A1c MFr Bld: 5.6 % of total Hgb (ref <5.7)
Mean Plasma Glucose: 114 mg/dL
eAG (mmol/L): 6.3 mmol/L

## 2022-12-18 LAB — VITAMIN D 25 HYDROXY (VIT D DEFICIENCY, FRACTURES): Vit D, 25-Hydroxy: 100 ng/mL (ref 30–100)

## 2022-12-18 LAB — PSA: PSA: 3.57 ng/mL (ref <=4.00)

## 2022-12-18 LAB — MAGNESIUM: Magnesium: 1.7 mg/dL (ref 1.5–2.5)

## 2022-12-18 LAB — MICROALBUMIN / CREATININE URINE RATIO
Creatinine, Urine: 97 mg/dL (ref 20–320)
Microalb Creat Ratio: 5 mg/g creat (ref <30)
Microalb, Ur: 0.5 mg/dL

## 2022-12-18 LAB — TSH: TSH: 0.76 mIU/L (ref 0.40–4.50)

## 2022-12-18 LAB — INSULIN, RANDOM: Insulin: 37 u[IU]/mL — ABNORMAL HIGH

## 2022-12-18 NOTE — Progress Notes (Signed)
<><><><><><><><><><><><><><><><><><><><><><><><><><><><><><><><><> <><><><><><><><><><><><><><><><><><><><><><><><><><><><><><><><><> -   Test results slightly outside the reference range are not unusual. If there is anything important, I will review this with you,  otherwise it is considered normal test values.  If you have further questions,  please do not hesitate to contact me at the office or via My Chart.  <><><><><><><><><><><><><><><><><><><><><><><><><><><><><><><><><> <><><><><><><><><><><><><><><><><><><><><><><><><><><><><><><><><>  -  PSA in Normal Range - No Prostate cancer - Great !  <><><><><><><><><><><><><><><><><><><><><><><><><><><><><><><><><>  -  Magnesium  = 1.7 is STILL very  low- goal is betw 2.0 - 2.5,   - So..............Marland Kitchen  Recommend that you take Magnesium 500 mg tablet 3 x /day with Meals   - also important to eat lots of  leafy green vegetables   - spinach - Kale - collards - greens - okra - asparagus  - broccoli - quinoa - squash - almonds   - black, red, white beans  -  peas - green beans <><><><><><><><><><><><><><><><><><><><><><><><><><><><><><><><><>  - Chol = 123    & LDL = 56 -   -   Both    Excellent   - Very low risk for Heart Attack  / Stroke  - Please continue Atorvastatin / Lipitor  Same  ============================================================ ============================================================  -  A1c - Normal - No Diabetes  - Great !  <><><><><><><><><><><><><><><><><><><><><><><><><><><><><><><><><>  - Vitamin D = 100 - Wonderful  ! - Please keep dosage same  <><><><><><><><><><><><><><><><><><><><><><><><><><><><><><><><><>  - All Else   - CBC - Kidneys - Electrolytes - Liver - Magnesium & Thyroid    - all  Normal / OK <><><><><><><><><><><><><><><><><><><><><><><><><><><><><><><><><> <><><><><><><><><><><><><><><><><><><><><><><><><><><><><><><><><>  - Keep up the [place] Work   !   <><><><><><><><><><><><><><><><><><><><><><><><><><><><><><><><><> <><><><><><><><><><><><><><><><><><><><><><><><><><><><><><><><><>

## 2022-12-30 ENCOUNTER — Other Ambulatory Visit: Payer: Self-pay | Admitting: Internal Medicine

## 2022-12-30 DIAGNOSIS — J0110 Acute frontal sinusitis, unspecified: Secondary | ICD-10-CM

## 2022-12-30 DIAGNOSIS — J0100 Acute maxillary sinusitis, unspecified: Secondary | ICD-10-CM

## 2022-12-31 NOTE — Progress Notes (Unsigned)
Assessment and Plan:  There are no diagnoses linked to this encounter.    Further disposition pending results of labs. Discussed med's effects and SE's.   Over 30 minutes of exam, counseling, chart review, and critical decision making was performed.   Future Appointments  Date Time Provider [place]  01/01/2023 11:00 AM Alycia Rossetti, NP [facility] None  03/31/2023 11:00 AM Unk Pinto, MD [facility] None  07/06/2023  9:00 AM Darrol Jump, NP [facility] None  12/22/2023 11:00 AM Unk Pinto, MD [facility] None    ------------------------------------------------------------------------------------------------------------------   HPI There were no vitals taken for this visit. 72 y.o.male presents for  Past Medical History:  Diagnosis Date   Abnormal liver function test    Anxiety    Colon polyp    DDD (degenerative disc disease)    ED (erectile dysfunction) 04/05/2014   FHx: heart disease 01/28/2018   GERD (gastroesophageal reflux disease)    Hyperlipidemia    Hypertension    Insomnia    Tobacco dependence      No Known Allergies  Current Outpatient Medications on File Prior to Visit  Medication Sig   atorvastatin (LIPITOR) 20 MG tablet TAKE 1 TABLET BY MOUTH EVERY DAY FOR CHOLESTEROL   Cholecalciferol (VITAMIN D-3) 5000 UNITS TABS Take by mouth daily. Alternates between 5000 and 10000   losartan (COZAAR) 50 MG tablet Take 1 tablet  Daily  for BP   Multiple Vitamins-Minerals (CENTRUM SILVER PO) Take by mouth daily.   tadalafil (CIALIS) 20 MG tablet TAKE 1/2 TO 1 (ONE-HALF TO ONE) TABLET BY MOUTH EVERY 2 TO 3 DAYS AS NEEDED FOR XXXX   traZODone (DESYREL) 100 MG tablet Take  1 tablet 1-2 hours  before Bedtime  as needed for Sleep   No current facility-administered medications on file prior to visit.    ROS: all negative except above.   Physical Exam:  There were no vitals taken for this visit.  General Appearance: Well nourished, in no apparent  distress. Eyes: PERRLA, EOMs, conjunctiva no swelling or erythema Sinuses: No Frontal/maxillary tenderness ENT/Mouth: Ext aud canals clear, TMs without erythema, bulging. No erythema, swelling, or exudate on post pharynx.  Tonsils not swollen or erythematous. Hearing normal.  Neck: Supple, thyroid normal.  Respiratory: Respiratory effort normal, BS equal bilaterally without rales, rhonchi, wheezing or stridor.  Cardio: RRR with no MRGs. Brisk peripheral pulses without edema.  Abdomen: Soft, + BS.  Non tender, no guarding, rebound, hernias, masses. Lymphatics: Non tender without lymphadenopathy.  Musculoskeletal: Full ROM, 5/5 strength, normal gait.  Skin: Warm, dry without rashes, lesions, ecchymosis.  Neuro: Cranial nerves intact. Normal muscle tone, no cerebellar symptoms. Sensation intact.  Psych: Awake and oriented X 3, normal affect, Insight and Judgment appropriate.     Alycia Rossetti, NP 12:30 PM [facility]

## 2023-01-01 ENCOUNTER — Encounter: Payer: Self-pay | Admitting: Nurse Practitioner

## 2023-01-01 ENCOUNTER — Ambulatory Visit (INDEPENDENT_AMBULATORY_CARE_PROVIDER_SITE_OTHER): Payer: Medicare Other | Admitting: Nurse Practitioner

## 2023-01-01 VITALS — BP 140/72 | HR 82 | Temp 97.7°F | Ht 74.0 in | Wt 219.2 lb

## 2023-01-01 DIAGNOSIS — I10 Essential (primary) hypertension: Secondary | ICD-10-CM | POA: Diagnosis not present

## 2023-01-01 DIAGNOSIS — M545 Low back pain, unspecified: Secondary | ICD-10-CM

## 2023-01-01 MED ORDER — DEXAMETHASONE 4 MG PO TABS: ORAL_TABLET | ORAL | 0 refills | Status: DC

## 2023-01-01 NOTE — Patient Instructions (Signed)
Muscle Cramps and Spasms Muscle cramps and spasms happen when muscles tighten on their own. They can be in any muscle. They happen most often in the muscles in the back of your lower leg (calf). Muscle cramps are painful. They are often stronger and last longer than muscle spasms. Muscle spasms may or may not be painful. In many cases, the cause of a muscle cramp or spasm is not known. But it may be from: Doing more work or exercise than your body is ready for. Using the muscles too much (overuse). Staying in one position for too long. Not preparing enough or having bad form when you play a sport or do an activity. Getting hurt. Other causes may include: Not enough water in your body (dehydration). Taking certain medicines. Not having enough salts and minerals in the body (electrolytes). Follow these instructions at home: Eating and drinking Drink enough fluid to keep your pee (urine) pale yellow. Eat a healthy diet to help your muscles work well. Your diet should include: Fruits and vegetables. Lean protein. Whole grains. Low-fat or nonfat dairy products. Managing pain and stiffness     Massage, stretch, and relax the muscle. Do this for a few minutes at a time. If told, put ice on the muscle. This may help if you are sore or have pain after a cramp or spasm. Put ice in a plastic bag. Place a towel between your skin and the bag. Leave the ice on for 20 minutes, 2-3 times a day. If told, put heat on tight or tense muscles. Do this as often as told by your doctor. Use the heat source that your doctor recommends, such as a moist heat pack or a heating pad. Place a towel between your skin and the heat source. Leave the heat on for 20-30 minutes. If your skin turns bright red, take off the ice or heat right away to prevent skin damage. The risk of damage is higher if you cannot feel pain, heat, or cold. Take hot showers or baths to help relax the muscles. General instructions If you  are having cramps often, avoid intense exercise for a few days. Take over-the-counter and prescription medicines only as told by your doctor. Watch for any changes in your symptoms. Contact a doctor if: Your cramps or spasms get worse or happen more often. Your cramps or spasms do not get better with time. This information is not intended to replace advice given to you by your health care provider. Make sure you discuss any questions you have with your health care provider. Document Revised: 05/06/2022 Document Reviewed: 05/06/2022 Elsevier Patient Education  2023 [employer]  

## 2023-01-14 NOTE — Progress Notes (Unsigned)
     Future Appointments  Date Time Provider Department  01/15/2023 10:30 AM Lucky Cowboy, MD [facility]  03/31/2023 11:00 AM Lucky Cowboy, MD [facility]  07/06/2023  9:00 AM Adela Glimpse, NP [facility]  12/22/2023 11:00 AM Lucky Cowboy, MD [facility]    History of Present Illness:      This very nice 72 y.o. MWM  with  HTN, HLD,  Prediabetes and Vitamin D Deficiency  who presents with concerns  of swelling & pain in jaw.      Current Outpatient Medications on File Prior to Visit  Medication Sig   atorvastatin (LIPITOR) 20 MG tablet TAKE 1 TABLET  EVERY DAY F   VITAMIN D 5000 UNITS T Alternates between 5000 and 16109   losartan 50 MG tablet Take 1 tablet  Daily  for BP   Multiple Vitamins-Minerals  Take  daily. Not all the time   tadalafil 20 MG tablet 1/2 TO 1  TAB EVERY 2 TO 3 DAYS AS NEEDED    traZODone 100 MG tablet Take  1 tablet 1-2 hours  before Bedtime  as needed     No Known Allergies   Problem list He has Hyperlipidemia, mixed; Former smoker (45 pack year, quit 04/2020); ED (erectile dysfunction); Right bundle branch block (RBBB) with left anterior fascicular block (LAFB); Essential hypertension; Vitamin D deficiency; Medication management; CKD (chronic kidney disease) stage 2, GFR 60-89 ml/min; Gastroesophageal reflux disease; Overweight (BMI 25.0-29.9); Acute medial meniscus tear of right knee; Primary osteoarthritis of right knee; and Aortic atherosclerosis by Chest CT 02/13/2020 on their problem list.   Observations/Objective:  There were no vitals taken for this visit.  HEENT - WNL. Neck - supple.  Chest - Clear equal BS. Cor - Nl HS. RRR w/o sig MGR. PP 1(+). No edema. MS- FROM w/o deformities.  Gait Nl. Neuro -  Nl w/o focal abnormalities.   Assessment and Plan:      Follow Up Instructions:        I discussed the assessment and treatment plan with the patient. The patient was provided an opportunity to ask questions and all were  answered. The patient agreed with the plan and demonstrated an understanding of the instructions.       The patient was advised to call back or seek an in-person evaluation if the symptoms worsen or if the condition fails to improve as anticipated.    Marinus Maw, MD

## 2023-01-15 ENCOUNTER — Ambulatory Visit (INDEPENDENT_AMBULATORY_CARE_PROVIDER_SITE_OTHER): Payer: Medicare Other | Admitting: Internal Medicine

## 2023-01-15 ENCOUNTER — Encounter: Payer: Self-pay | Admitting: Internal Medicine

## 2023-01-15 VITALS — BP 138/84 | HR 83 | Temp 97.9°F | Resp 16 | Ht 74.0 in | Wt 215.5 lb

## 2023-01-15 DIAGNOSIS — M26623 Arthralgia of bilateral temporomandibular joint: Secondary | ICD-10-CM | POA: Diagnosis not present

## 2023-01-15 DIAGNOSIS — R2681 Unsteadiness on feet: Secondary | ICD-10-CM

## 2023-01-15 DIAGNOSIS — Z9181 History of falling: Secondary | ICD-10-CM

## 2023-01-15 DIAGNOSIS — R29898 Other symptoms and signs involving the musculoskeletal system: Secondary | ICD-10-CM

## 2023-01-15 MED ORDER — DEXAMETHASONE 4 MG PO TABS: ORAL_TABLET | ORAL | 1 refills | Status: DC

## 2023-01-15 MED ORDER — CYCLOBENZAPRINE HCL 10 MG PO TABS: ORAL_TABLET | ORAL | 2 refills | Status: DC

## 2023-01-15 NOTE — Patient Instructions (Signed)
Temporomandibular Joint Syndrome  Temporomandibular joint syndrome (TMJ syndrome) is a condition that causes pain in the temporomandibular joints. These joints are located near your ears and allow your jaw to open and close. For people with TMJ syndrome, chewing, biting, or other movements of the jaw can be difficult or painful. TMJ syndrome is often mild and goes away within a few weeks. However, sometimes the condition becomes a long-term (chronic) problem. What are the causes? This condition may be caused by: Grinding your teeth or clenching your jaw. Some people do this when they are stressed. Arthritis. An injury to the jaw. A head or neck injury. Teeth or dentures that are not aligned well. In some cases, the cause of TMJ syndrome may not be known. What are the signs or symptoms? The most common symptom of this condition is aching pain on the side of the head in the area of the TMJ. Other symptoms may include: Pain when moving your jaw, such as when chewing or biting. Not being able to open your jaw all the way. Making a clicking sound when you open your mouth. Headache. Earache. Neck or shoulder pain. How is this diagnosed? This condition may be diagnosed based on: Your symptoms and medical history. A physical exam. Your health care provider may check the range of motion of your jaw. Imaging tests, such as X-rays or an MRI. You may also need to see your dentist, who will check if your teeth and jaw are lined up correctly. How is this treated? TMJ syndrome often goes away on its own. If treatment is needed, it may include: Eating soft foods and applying ice or heat. Medicines to relieve pain or inflammation. Medicines or massage to relax the muscles. A splint, bite plate, or mouthpiece to prevent teeth grinding or jaw clenching. Relaxation techniques or counseling to help reduce stress. A therapy for pain in which an electrical current is applied to the nerves through the skin  (transcutaneous electrical nerve stimulation). Acupuncture. This may help to relieve pain. Jaw surgery. This is rarely needed. Follow these instructions at home:  Eating and drinking Eat a soft diet if you are having trouble chewing. Avoid foods that require a lot of chewing. Do not chew gum. General instructions Take over-the-counter and prescription medicines only as told by your health care provider. If directed, put ice on the painful area. To do this: Put ice in a plastic bag. Place a towel between your skin and the bag. Leave the ice on for 20 minutes, 2-3 times a day. Remove the ice if your skin turns bright red. This is very important. If you cannot feel pain, heat, or cold, you have a greater risk of damage to the area. Apply a warm, wet cloth (warm compress) to the painful area as told. Massage your jaw area and do any jaw stretching exercises as told by your health care provider. If you were given a splint, bite plate, or mouthpiece, wear it as told by your health care provider. Keep all follow-up visits. This is important. Where to find more information National Institute of Dental and [employer]io[employer] Research: www.nidcr.nih.gov Contact a health care provider if: You have trouble eating. You have new or worsening symptoms. Get help right away if: Your jaw locks. Summary Temporomandibular joint syndrome (TMJ syndrome) is a condition that causes pain in the temporomandibular joints. These joints are located near your ears and allow your jaw to open and close. TMJ syndrome is often mild and goes away within   a few weeks. However, sometimes the condition becomes a long-term (chronic) problem. Symptoms include an aching pain on the side of the head in the area of the TMJ, pain when chewing or biting, and being unable to open your jaw all the way. You may also make a clicking sound when you open your mouth. TMJ syndrome often goes away on its own. If treatment is needed, it may  include medicines to relieve pain, reduce inflammation, or relax the muscles. A splint, bite plate, or mouthpiece may also be used to prevent teeth grinding or jaw clenching. This information is not intended to replace advice given to you by your health care provider. Make sure you discuss any questions you have with your health care provider. Document Revised: 04/28/2021 Document Reviewed: 04/28/2021 Elsevier Patient Education  2023 [employer] Inc.  

## 2023-01-22 ENCOUNTER — Other Ambulatory Visit: Payer: Self-pay | Admitting: Internal Medicine

## 2023-02-03 ENCOUNTER — Encounter: Payer: Self-pay | Admitting: Internal Medicine

## 2023-02-03 ENCOUNTER — Ambulatory Visit (INDEPENDENT_AMBULATORY_CARE_PROVIDER_SITE_OTHER): Payer: Medicare Other | Admitting: Internal Medicine

## 2023-02-03 VITALS — BP 140/80 | HR 75 | Temp 97.5°F | Ht 74.0 in | Wt 216.0 lb

## 2023-02-03 DIAGNOSIS — J438 Other emphysema: Secondary | ICD-10-CM

## 2023-02-03 DIAGNOSIS — I951 Orthostatic hypotension: Secondary | ICD-10-CM

## 2023-02-03 DIAGNOSIS — R5383 Other fatigue: Secondary | ICD-10-CM

## 2023-02-03 DIAGNOSIS — I10 Essential (primary) hypertension: Secondary | ICD-10-CM | POA: Diagnosis not present

## 2023-02-03 DIAGNOSIS — Z79899 Other long term (current) drug therapy: Secondary | ICD-10-CM

## 2023-02-03 LAB — CBC WITH DIFFERENTIAL/PLATELET
Absolute Monocytes: 592 cells/uL (ref 200–950)
Basophils Absolute: 52 cells/uL (ref 0–200)
Eosinophils Absolute: 128 cells/uL (ref 15–500)
MCHC: 34 g/dL (ref 32.0–36.0)
MPV: 10.3 fL (ref 7.5–12.5)
Neutro Abs: 3741 cells/uL (ref 1500–7800)
Neutrophils Relative %: 64.5 %
Platelets: 263 10*3/uL (ref 140–400)
RBC: 4.61 10*6/uL (ref 4.20–5.80)
RDW: 12.5 % (ref 11.0–15.0)

## 2023-02-03 MED ORDER — TADALAFIL 5 MG PO TABS: ORAL_TABLET | ORAL | 3 refills | Status: AC

## 2023-02-03 NOTE — Progress Notes (Signed)
Future Appointments  Date Time Provider Department  03/31/2023 11:00 AM Lucky Cowboy, MD [facility]  07/06/2023  9:00 AM Adela Glimpse, NP [facility]  12/22/2023 11:00 AM Lucky Cowboy, MD [facility]    History of Present Illness:      This very nice 72 y.o. MWM  with  HTN, HLD,  Prediabetes and Vitamin D Deficiency   presents with concerns of occasional low BP, fatigue & occasional postural weakness of lightheadedness with standing. He admits poor daily fluid intake. Denies CP, Dyspnea, palpitations .    Current Outpatient Medications on File Prior to Visit  Medication Sig   AMOXICILLIN  Takes for dental infection    atorvastatin  20 MG tablet TAKE 1 TABLET EVERY DAY L   VITAMIN D  5000   u  Alternates between 5000 and 13086   losartan  50 MG tablet Take 1 tablet  Daily  for BP   Magnesium 250 MG TABS Take by mouth. Takes 2 tablets daily   tadalafil 20 MG tablet TAKE 1/2 TO 1 (ONE-HALF TO ONE) TABLET BY MOUTH EVERY 2 TO 3 DAYS AS NEEDED FOR XXXX   traZODone 100 MG tablet Take  1 tablet 1-2 hours  before Bedtime  as needed for Sleep    No Known Allergies   Problem list He has Hyperlipidemia, mixed; Former smoker (45 pack year, quit 04/2020); ED (erectile dysfunction); Right bundle branch block (RBBB) with left anterior fascicular block (LAFB); Essential hypertension; Vitamin D deficiency; Medication management; CKD (chronic kidney disease) stage 2, GFR 60-89 ml/min; Gastroesophageal reflux disease; Overweight (BMI 25.0-29.9); Acute medial meniscus tear of right knee; Primary osteoarthritis of right knee; and Aortic atherosclerosis by Chest CT 02/13/2020 on their problem list.   Observations/Objective:  BP (!) 140/80   Pulse 75   Temp (!) 97.5 F (36.4 C)   Ht 6\' 2"  (1.88 m)   Wt 216 lb (98 kg)   SpO2 99%   BMI 27.73 kg/m   Postural Sitting BP 141/84   P 72      &    Standing   BP 138/89   P 85   HEENT - WNL. Neck - supple.  Chest - Clear equal BS. Cor - Nl  HS. RRR w/o sig MGR. PP 1(+). No edema. MS- FROM w/o deformities.  Gait Nl. Neuro -  Nl w/o focal abnormalities.   Assessment and Plan:   1. Essential hypertension  - CBC with Differential/Platelet - COMPLETE METABOLIC PANEL WITH GFR  2. Postural hypotension  - Advised cut Losartan 50 mg to 1/2 tab = 25 mg q pm & continue to monitor postural BP's   - Also recommended  to increase daily fluid intake to 3 quarts /day  ~ 3 liters / day  ~ 6 bottles ( 16 oz) / day   - CBC with Differential/Platelet - COMPLETE METABOLIC PANEL WITH GFR  3. Fatigue, unspecified type  - CBC with Differential/Platelet - COMPLETE METABOLIC PANEL WITH GFR  4. Medication management  - CBC with Differential/Platelet - COMPLETE METABOLIC PANEL WITH GFR    Follow Up Instructions:        I discussed the assessment and treatment plan with the patient. The patient was provided an opportunity to ask questions and all were answered. The patient agreed with the plan and demonstrated an understanding of the instructions. Patient also relates some problems with LUTS & ED ,  so  is given a Rx for Cialis 5 mg /day.  The patient was advised to call back or seek an in-person evaluation if the symptoms worsen or if the condition fails to improve as anticipated.    Kirtland Bouchard, MD

## 2023-02-04 LAB — COMPLETE METABOLIC PANEL WITH GFR
AG Ratio: 1.8 (calc) (ref 1.0–2.5)
ALT: 11 U/L (ref 9–46)
AST: 11 U/L (ref 10–35)
Albumin: 3.9 g/dL (ref 3.6–5.1)
Alkaline phosphatase (APISO): 55 U/L (ref 35–144)
BUN: 19 mg/dL (ref 7–25)
CO2: 29 mmol/L (ref 20–32)
Calcium: 9.2 mg/dL (ref 8.6–10.3)
Chloride: 102 mmol/L (ref 98–110)
Creat: 1.23 mg/dL (ref 0.70–1.28)
Globulin: 2.2 g/dL (calc) (ref 1.9–3.7)
Glucose, Bld: 110 mg/dL — ABNORMAL HIGH (ref 65–99)
Potassium: 4.3 mmol/L (ref 3.5–5.3)
Sodium: 138 mmol/L (ref 135–146)
Total Bilirubin: 0.5 mg/dL (ref 0.2–1.2)
Total Protein: 6.1 g/dL (ref 6.1–8.1)
eGFR: 62 mL/min/{1.73_m2} (ref >=60)

## 2023-02-04 LAB — CBC WITH DIFFERENTIAL/PLATELET
Basophils Relative: 0.9 %
Eosinophils Relative: 2.2 %
HCT: 41.2 % (ref 38.5–50.0)
Hemoglobin: 14 g/dL (ref 13.2–17.1)
Lymphs Abs: 1288 cells/uL (ref 850–3900)
MCH: 30.4 pg (ref 27.0–33.0)
MCV: 89.4 fL (ref 80.0–100.0)
Monocytes Relative: 10.2 %
Total Lymphocyte: 22.2 %
WBC: 5.8 10*3/uL (ref 3.8–10.8)

## 2023-02-04 NOTE — Progress Notes (Signed)
^<^<^<^<^<^<^<^<^<^<^<^<^<^<^<^<^<^<^<^<^<^<^<^<^<^<^<^<^<^<^<^<^<^<^<^<^ ^>^>^>^>^>^>^>^>^>^>^>>^>^>^>^>^>^>^>^>^>^>^>^>^>^>^>^>^>^>^>^>^>^>^>^>^>  -  Test results slightly outside the reference range are not unusual. If there is anything important, I will review this with you,  otherwise it is considered normal test values.  If you have further questions,  please do not hesitate to contact me at the office or via My Chart.   ^<^<^<^<^<^<^<^<^<^<^<^<^<^<^<^<^<^<^<^<^<^<^<^<^<^<^<^<^<^<^<^<^<^<^<^<^ ^>^>^>^>^>^>^>^>^>^>^>^>^>^>^>^>^>^>^>^>^>^>^>^>^>^>^>^>^>^>^>^>^>^>^>^>^  - CBC & CMET are All OK

## 2023-02-20 ENCOUNTER — Ambulatory Visit (HOSPITAL_BASED_OUTPATIENT_CLINIC_OR_DEPARTMENT_OTHER): Payer: Medicare Other

## 2023-02-24 ENCOUNTER — Other Ambulatory Visit: Payer: Self-pay | Admitting: Internal Medicine

## 2023-02-24 ENCOUNTER — Encounter: Payer: Self-pay | Admitting: Internal Medicine

## 2023-02-24 MED ORDER — DEXAMETHASONE 2 MG PO TABS: ORAL_TABLET | ORAL | 0 refills | Status: DC

## 2023-03-09 ENCOUNTER — Ambulatory Visit (HOSPITAL_BASED_OUTPATIENT_CLINIC_OR_DEPARTMENT_OTHER)
Admission: RE | Admit: 2023-03-09 | Discharge: 2023-03-09 | Disposition: A | Discharge status: Home / Self Care | Payer: Medicare Other | Source: Ambulatory Visit | Attending: Internal Medicine | Admitting: Internal Medicine

## 2023-03-09 ENCOUNTER — Encounter (HOSPITAL_BASED_OUTPATIENT_CLINIC_OR_DEPARTMENT_OTHER): Payer: Self-pay

## 2023-03-09 DIAGNOSIS — Z122 Encounter for screening for malignant neoplasm of respiratory organs: Secondary | ICD-10-CM | POA: Diagnosis not present

## 2023-03-09 DIAGNOSIS — Z87891 Personal history of nicotine dependence: Secondary | ICD-10-CM | POA: Insufficient documentation

## 2023-03-12 ENCOUNTER — Encounter: Payer: Self-pay | Admitting: Internal Medicine

## 2023-03-31 ENCOUNTER — Ambulatory Visit (INDEPENDENT_AMBULATORY_CARE_PROVIDER_SITE_OTHER): Payer: Medicare Other | Admitting: Internal Medicine

## 2023-03-31 ENCOUNTER — Encounter: Payer: Self-pay | Admitting: Internal Medicine

## 2023-03-31 VITALS — BP 136/78 | HR 78 | Temp 97.8°F | Resp 16 | Ht 74.0 in | Wt 215.4 lb

## 2023-03-31 DIAGNOSIS — E782 Mixed hyperlipidemia: Secondary | ICD-10-CM | POA: Diagnosis not present

## 2023-03-31 DIAGNOSIS — Z79899 Other long term (current) drug therapy: Secondary | ICD-10-CM

## 2023-03-31 DIAGNOSIS — R7309 Other abnormal glucose: Secondary | ICD-10-CM | POA: Diagnosis not present

## 2023-03-31 DIAGNOSIS — I10 Essential (primary) hypertension: Secondary | ICD-10-CM

## 2023-03-31 DIAGNOSIS — I700 Atherosclerosis of aorta: Secondary | ICD-10-CM | POA: Diagnosis not present

## 2023-03-31 DIAGNOSIS — E559 Vitamin D deficiency, unspecified: Secondary | ICD-10-CM

## 2023-03-31 LAB — CBC WITH DIFFERENTIAL/PLATELET
Eosinophils Absolute: 10 cells/uL — ABNORMAL LOW (ref 15–500)
Eosinophils Relative: 0.2 %
Lymphs Abs: 723 cells/uL — ABNORMAL LOW (ref 850–3900)
MCHC: 33.9 g/dL (ref 32.0–36.0)
Neutrophils Relative %: 79.4 %

## 2023-03-31 NOTE — Patient Instructions (Signed)

## 2023-03-31 NOTE — Progress Notes (Signed)
Future Appointments  Date Time Provider Department  03/31/2023                 3 mo ov 11:00 AM Lucky Cowboy, MD [facility]  07/06/2023                 wellness  9:00 AM Adela Glimpse, NP [facility]  12/22/2023                cpe 11:00 AM Lucky Cowboy, MD [facility]    History of Present Illness:       This very nice 72 y.o.  MWM  presents for 3  month follow up with HTN, HLD, Pre-Diabetes and Vitamin D Deficiency. Chest CT in 2021 showed Aortic Atherosclerosis.           Patient is treated for HTN  circa 2001  & BP has been controlled at home. Today's BP  is  at goal -  136/78 . Patient has had no complaints of any cardiac type chest pain, palpitations, dyspnea Pollyann Kennedy /PND, dizziness, claudication or dependent edema.        Hyperlipidemia is controlled with diet & Atorvastatin. Patient denies myalgias or other med SE's. Last Lipids were at goal:  Lab Results  Component Value Date   CHOL 123 12/17/2022   HDL 49 12/17/2022   LDLCALC 56 12/17/2022   TRIG 93 12/17/2022   CHOLHDL 2.5 12/17/2022     Also, the patient has history of  glucose intolerance  /PreDiabetes  (A1c 5.9% /2015) and has had no symptoms of reactive hypoglycemia, diabetic polys, paresthesias or visual blurring.  Last A1c was near Normal:  Lab Results  Component Value Date   HGBA1C 5.6 12/17/2022   Wt Readings from Last 3 Encounters:  03/31/23 215 lb 6.4 oz (97.7 kg)  03/09/23 215 lb (97.5 kg)  02/03/23 216 lb (98 kg)                                                          Further, the patient also has history of Vitamin D Deficiency and supplements vitamin D without any suspected side-effects. Last vitamin D was at goal:  Lab Results  Component Value Date   VD25OH 100 12/17/2022     Current Outpatient Medications on File Prior to Visit  Medication Sig   AMOXICILLIN Take by mouth.   atorvastatin 20 MG tablet TAKE 1 TABLET EVERY DAY    VITAMIN D  5000 UNITS TABS Take  daily.  Alternates between 5000 and 04540   dexamethasone 2 MG tablet Take 1 tab every other day for pain & inflammation   losartan 50 MG tablet Take 1 tablet  Daily    Magnesium 250 MG TABS Takes 2 tablets daily   tadalafil 5 MG tablet Take 1 tablet  Daily     traZODone 100 MG tablet Take  1 tablet 1-2 hours  before Bedtime  as needed     No Known Allergies    PMHx:   Past Medical History:  Diagnosis Date   Abnormal liver function test    Anxiety    Colon polyp    DDD (degenerative disc disease)    ED (erectile dysfunction) 04/05/2014   FHx: heart disease 01/28/2018   GERD (gastroesophageal reflux disease)    Hyperlipidemia  Hypertension    Insomnia    Tobacco dependence      Immunization History  Administered Date(s) Administered   Influenza Split 07/05/2014   Influenza, High Dose   08/28/2020, 07/04/2021, 07/04/2022   PFIZER-SARS-COV-2 Vacc 01/26/2020, 02/20/2020, 09/15/2020   Pneumococcal -13 05/20/2016   Pneumococcal -23 10/06/2012, 11/14/2019   Td 09/29/2006, 07/10/2017   Zoster, Live 08/21/2015     Past Surgical History:  Procedure Laterality Date   ADENOIDECTOMY     age 42   APPENDECTOMY     DENTAL SURGERY     Dental implants   LAPAROSCOPIC CHOLECYSTECTOMY  05/2020   At surgery center, Dr. Judene Companion SURGERY  Aug 2016   Dr. Gypsy Lore     VASECTOMY      FHx:    Reviewed / unchanged   SHx:    Reviewed / unchanged    Systems Review:  Constitutional: Denies fever, chills, wt changes, headaches, insomnia, fatigue, night sweats, change in appetite. Eyes: Denies redness, blurred vision, diplopia, discharge, itchy, watery eyes.  ENT: Denies discharge, congestion, post nasal drip, epistaxis, sore throat, earache, hearing loss, dental pain, tinnitus, vertigo, sinus pain, snoring.  CV: Denies chest pain, palpitations, irregular heartbeat, syncope, dyspnea, diaphoresis, orthopnea, PND, claudication or edema. Respiratory: denies cough, dyspnea, DOE,  pleurisy, hoarseness, laryngitis, wheezing.  Gastrointestinal: Denies dysphagia, odynophagia, heartburn, reflux, water brash, abdominal pain or cramps, nausea, vomiting, bloating, diarrhea, constipation, hematemesis, melena, hematochezia  or hemorrhoids. Genitourinary: Denies dysuria, frequency, urgency, nocturia, hesitancy, discharge, hematuria or flank pain. Musculoskeletal: Denies arthralgias, myalgias, stiffness, jt. swelling, pain, limping or strain/sprain.  Skin: Denies pruritus, rash, hives, warts, acne, eczema or change in skin lesion(s). Neuro: No weakness, tremor, incoordination, spasms, paresthesia or pain. Psychiatric: Denies confusion, memory loss or sensory loss. Endo: Denies change in weight, skin or hair change.  Heme/Lymph: No excessive bleeding, bruising or enlarged lymph nodes.   Physical Exam  BP 136/78   Pulse 78   Temp 97.8 F (36.6 C)   Resp 16   Ht 6\' 2"  (1.88 m)   Wt 215 lb 6.4 oz (97.7 kg)   SpO2 94%   BMI 27.66 kg/m   Appears  well nourished, well groomed  and in no distress.  Eyes: PERRLA, EOMs, conjunctiva no swelling or erythema. Sinuses: No frontal/maxillary tenderness ENT/Mouth: EAC's clear, TM's nl w/o erythema, bulging. Nares clear w/o erythema, swelling, exudates. Oropharynx clear without erythema or exudates. Oral hygiene is good. Tongue normal, non obstructing. Hearing intact.  Neck: Supple. Thyroid not palpable. Car 2+/2+ without bruits, nodes or JVD. Chest: Respirations nl with BS clear & equal w/o rales, rhonchi, wheezing or stridor.  Cor: Heart sounds normal w/ regular rate and rhythm without sig. murmurs, gallops, clicks or rubs. Peripheral pulses normal and equal  without edema.  Abdomen: Soft & bowel sounds normal. Non-tender w/o guarding, rebound, hernias, masses or organomegaly.  Lymphatics: Unremarkable.  Musculoskeletal: Full ROM all peripheral extremities, joint stability, 5/5 strength and normal gait.  Skin: Warm, dry without  exposed rashes, lesions or ecchymosis apparent.  Neuro: Cranial nerves intact, reflexes equal bilaterally. Sensory-motor testing grossly intact. Tendon reflexes grossly intact.  Pysch: Alert & oriented x 3.  Insight and judgement nl & appropriate. No ideations.  Assessment and Plan:  1. Essential hypertension  - Continue medication, monitor blood pressure at home.  - Continue DASH diet.  Reminder to go to the ER if any CP,  SOB, nausea, dizziness, severe HA, changes vision/speech.  - CBC  with Differential/Platelet - COMPLETE METABOLIC PANEL WITH GFR - Magnesium - TSH   2. Hyperlipidemia, mixed  - Continue diet/meds, exercise,& lifestyle modifications.  - Continue monitor periodic cholesterol/liver & renal functions   - Lipid panel - TSH   3. Abnormal glucose  - Continue diet, exercise  - Lifestyle modifications.  - Monitor appropriate labs.  - Hemoglobin A1c - Insulin, random   4. Vitamin D deficiency  - Continue supplementation.  - VITAMIN D 25 Hydroxy    6. Medication management  - CBC with Differential/Platelet - COMPLETE METABOLIC PANEL WITH GFR - Magnesium - Lipid panel - TSH - Hemoglobin A1c - Insulin, random - VITAMIN D 25 Hydroxy        Discussed  regular exercise, BP monitoring, weight control to achieve/maintain BMI less than 25 and discussed med and SE's. Recommended labs to assess and monitor clinical status with further disposition pending results of labs.  I discussed the assessment and treatment plan with the patient. The patient was provided an opportunity to ask questions and all were answered. The patient agreed with the plan and demonstrated an understanding of the instructions.  I provided over 30 minutes of exam, counseling, chart review and  complex critical decision making.         The patient was advised to call back or seek an in-person evaluation if the symptoms worsen or if the condition fails to improve as anticipated.   Marinus Maw, MD

## 2023-04-01 ENCOUNTER — Other Ambulatory Visit: Payer: Self-pay | Admitting: Internal Medicine

## 2023-04-01 DIAGNOSIS — D511 Vitamin B12 deficiency anemia due to selective vitamin B12 malabsorption with proteinuria: Secondary | ICD-10-CM

## 2023-04-01 DIAGNOSIS — D509 Iron deficiency anemia, unspecified: Secondary | ICD-10-CM

## 2023-04-01 DIAGNOSIS — D649 Anemia, unspecified: Secondary | ICD-10-CM

## 2023-04-01 LAB — COMPLETE METABOLIC PANEL WITH GFR
AG Ratio: 1.8 (calc) (ref 1.0–2.5)
ALT: 11 U/L (ref 9–46)
AST: 13 U/L (ref 10–35)
Albumin: 4.1 g/dL (ref 3.6–5.1)
Alkaline phosphatase (APISO): 57 U/L (ref 35–144)
BUN: 23 mg/dL (ref 7–25)
CO2: 27 mmol/L (ref 20–32)
Calcium: 9.3 mg/dL (ref 8.6–10.3)
Chloride: 104 mmol/L (ref 98–110)
Creat: 1.18 mg/dL (ref 0.70–1.28)
Globulin: 2.3 g/dL (calc) (ref 1.9–3.7)
Glucose, Bld: 139 mg/dL — ABNORMAL HIGH (ref 65–99)
Potassium: 4.4 mmol/L (ref 3.5–5.3)
Sodium: 138 mmol/L (ref 135–146)
Total Bilirubin: 0.5 mg/dL (ref 0.2–1.2)
Total Protein: 6.4 g/dL (ref 6.1–8.1)
eGFR: 66 mL/min/{1.73_m2} (ref >=60)

## 2023-04-01 LAB — HEMOGLOBIN A1C
Hgb A1c MFr Bld: 6 % of total Hgb — ABNORMAL HIGH (ref <5.7)
Mean Plasma Glucose: 126 mg/dL
eAG (mmol/L): 7 mmol/L

## 2023-04-01 LAB — MAGNESIUM: Magnesium: 1.8 mg/dL (ref 1.5–2.5)

## 2023-04-01 LAB — CBC WITH DIFFERENTIAL/PLATELET
Absolute Monocytes: 307 cells/uL (ref 200–950)
Basophils Absolute: 31 cells/uL (ref 0–200)
Basophils Relative: 0.6 %
HCT: 36.6 % — ABNORMAL LOW (ref 38.5–50.0)
Hemoglobin: 12.4 g/dL — ABNORMAL LOW (ref 13.2–17.1)
MCH: 30.9 pg (ref 27.0–33.0)
MCV: 91.3 fL (ref 80.0–100.0)
MPV: 10 fL (ref 7.5–12.5)
Monocytes Relative: 5.9 %
Neutro Abs: 4129 cells/uL (ref 1500–7800)
Platelets: 240 10*3/uL (ref 140–400)
RBC: 4.01 10*6/uL — ABNORMAL LOW (ref 4.20–5.80)
RDW: 12.8 % (ref 11.0–15.0)
Total Lymphocyte: 13.9 %
WBC: 5.2 10*3/uL (ref 3.8–10.8)

## 2023-04-01 LAB — TSH: TSH: 0.81 mIU/L (ref 0.40–4.50)

## 2023-04-01 LAB — VITAMIN D 25 HYDROXY (VIT D DEFICIENCY, FRACTURES): Vit D, 25-Hydroxy: 95 ng/mL (ref 30–100)

## 2023-04-01 LAB — LIPID PANEL
Cholesterol: 125 mg/dL (ref <200)
HDL: 47 mg/dL (ref >=40)
LDL Cholesterol (Calc): 64 mg/dL (calc)
Non-HDL Cholesterol (Calc): 78 mg/dL (calc) (ref <130)
Total CHOL/HDL Ratio: 2.7 (calc) (ref <5.0)
Triglycerides: 55 mg/dL (ref <150)

## 2023-04-01 LAB — INSULIN, RANDOM: Insulin: 14 u[IU]/mL

## 2023-04-01 NOTE — Progress Notes (Signed)
^<^<^<^<^<^<^<^<^<^<^<^<^<^<^<^<^<^<^<^<^<^<^<^<^<^<^<^<^<^<^<^<^<^<^<^<^ ^>^>^>^>^>^>^>^>^>^>^>>^>^>^>^>^>^>^>^>^>^>^>^>^>^>^>^>^>^>^>^>^>^>^>^>^>  -Test results slightly outside the reference range are not unusual. If there is anything important, I will review this with you,  otherwise it is considered normal test values.  If you have further questions,  please do not hesitate to contact me at the office or via My Chart.   ^<^<^<^<^<^<^<^<^<^<^<^<^<^<^<^<^<^<^<^<^<^<^<^<^<^<^<^<^<^<^<^<^<^<^<^<^ ^>^>^>^>^>^>^>^>^>^>^>^>^>^>^>^>^>^>^>^>^>^>^>^>^>^>^>^>^>^>^>^>^>^>^>^>^  -  CBC shows mild anemia - Red blood cell count has                                            dropped down a little, since last checked 2 months ago   - Please call office Monday  to schedule a nurse visit                                                                             about Wed /Thurs the 10th or 11th  To recheck some labs   ^<^<^<^<^<^<^<^<^<^<^<^<^<^<^<^<^<^<^<^<^<^<^<^<^<^<^<^<^<^<^<^<^<^<^<^<^ ^>^>^>^>^>^>^>^>^>^>^>^>^>^>^>^>^>^>^>^>^>^>^>^>^>^>^>^>^>^>^>^>^>^>^>^>^ -  Glucose is elevated at 139  and A!C = 6.0% - also elevated                                                                                            ( Normal is less than 5.7% )   ^<^<^<^<^<^<^<^<^<^<^<^<^<^<^<^<^<^<^<^<^<^<^<^<^<^<^<^<^<^<^<^<^<^<^<^<^ ^>^>^>^>^>^>^>^>^>^>^>^>^>^>^>^>^>^>^>^>^>^>^>^>^>^>^>^>^>^>^>^>^>^>^>^>^  -   Magnesium = 1.8  is  very  low  - goal is betw 2.0 - 2.5,   - So..............Marland Kitchen  Recommend that you take                                                 Magnesium 500 mg tablet 2 x /day with Meals   - also important to eat lots of  leafy green vegetables   - spinach - Kale - collards - greens - okra - asparagus - broccoli - quinoa  - squash - almonds - black, red, white beans -  peas - green  beans  ^<^<^<^<^<^<^<^<^<^<^<^<^<^<^<^<^<^<^<^<^<^<^<^<^<^<^<^<^<^<^<^<^<^<^<^<^ ^>^>^>^>^>^>^>^>^>^>^>^>^>^>^>^>^>^>^>^>^>^>^>^>^>^>^>^>^>^>^>^>^>^>^>^>^  -  Chol = 125  &  LDL = 64 - Wonderful  -   Both  Excellent   - Very low risk for Heart Attack  / Stroke  ^>^>^>^>^>^>^>^>^>^>^>^>^>^>^>^>^>^>^>^>^>^>^>^>^>^>^>^>^>^>^>^>^>^>^>^>^ ^>^>^>^>^>^>^>^>^>^>^>^>^>^>^>^>^>^>^>^>^>^>^>^>^>^>^>^>^>^>^>^>^>^>^>^>^  -  Vitamin D = 95  - Also Excellent  !      Please keep dosage same   ^<^<^<^<^<^<^<^<^<^<^<^<^<^<^<^<^<^<^<^<^<^<^<^<^<^<^<^<^<^<^<^<^<^<^<^<^ ^>^>^>^>^>^>^>^>^>^>^>^>^>^>^>^>^>^>^>^>^>^>^>^>^>^>^>^>^>^>^>^>^>^>^>^>^  -  All Else - CBC - Kidneys - Electrolytes - Liver - Magnesium & Thyroid    - all  Normal / OK  ^<^<^<^<^<^<^<^<^<^<^<^<^<^<^<^<^<^<^<^<^<^<^<^<^<^<^<^<^<^<^<^<^<^<^<^<^ ^>^>^>^>^>^>^>^>^>^>^>^>^>^>^>^>^>^>^>^>^>^>^>^>^>^>^>^>^>^>^>^>^>^>^>^>^

## 2023-04-08 ENCOUNTER — Ambulatory Visit (INDEPENDENT_AMBULATORY_CARE_PROVIDER_SITE_OTHER): Payer: Medicare Other

## 2023-04-08 VITALS — BP 150/74 | HR 72 | Temp 97.3°F | Ht 74.0 in | Wt 217.0 lb

## 2023-04-08 DIAGNOSIS — D509 Iron deficiency anemia, unspecified: Secondary | ICD-10-CM

## 2023-04-08 DIAGNOSIS — D649 Anemia, unspecified: Secondary | ICD-10-CM | POA: Diagnosis not present

## 2023-04-08 DIAGNOSIS — D511 Vitamin B12 deficiency anemia due to selective vitamin B12 malabsorption with proteinuria: Secondary | ICD-10-CM | POA: Diagnosis not present

## 2023-04-08 DIAGNOSIS — E538 Deficiency of other specified B group vitamins: Secondary | ICD-10-CM | POA: Diagnosis not present

## 2023-04-08 NOTE — Progress Notes (Signed)
Patient presents to the office for a nurse visit to recheck labs. No questions or concerns.

## 2023-04-09 NOTE — Progress Notes (Signed)
^<^<^<^<^<^<^<^<^<^<^<^<^<^<^<^<^<^<^<^<^<^<^<^<^<^<^<^<^<^<^<^<^<^<^<^<^ ^>^>^>^>^>^>^>^>^>^>^>>^>^>^>^>^>^>^>^>^>^>^>^>^>^>^>^>^>^>^>^>^>^>^>^>^>  -   Iron levels are OK , BUT  . . . . . .   -  Vitamin B12 =  183  are VERY  VERY  Low  !  !  !                                                            (Ideal or Goal Vit B12 is between 450 - 1,100)    -  Low Vit B12 may be associated with Anemia , Fatigue,                                      Peripheral Neuropathy, Dementia, "Brain Fog", & Depression    - Recommend take a sub-lingual form of Vitamin B12 tablet                                 1,000 to 5,000 mcg tab that you dissolve under your tongue /Daily     - Can get Lavonia Dana - best price at [employer] or on [employer]  ^<^<^<^<^<^<^<^<^<^<^<^<^<^<^<^<^<^<^<^<^<^<^<^<^<^<^<^<^<^<^<^<^<^<^<^<^ ^>^>^>^>^>^>^>^>^>^>^>^>^>^>^>^>^>^>^>^>^>^>^>^>^>^>^>^>^>^>^>^>^>^>^>^>^

## 2023-04-10 LAB — CBC WITH DIFFERENTIAL/PLATELET
Absolute Monocytes: 238 cells/uL (ref 200–950)
Basophils Absolute: 32 cells/uL (ref 0–200)
Basophils Relative: 0.6 %
Eosinophils Absolute: 22 cells/uL (ref 15–500)
Eosinophils Relative: 0.4 %
HCT: 38.2 % — ABNORMAL LOW (ref 38.5–50.0)
Hemoglobin: 13 g/dL — ABNORMAL LOW (ref 13.2–17.1)
Lymphs Abs: 637 cells/uL — ABNORMAL LOW (ref 850–3900)
MCH: 31.1 pg (ref 27.0–33.0)
MCHC: 34 g/dL (ref 32.0–36.0)
MCV: 91.4 fL (ref 80.0–100.0)
MPV: 10.1 fL (ref 7.5–12.5)
Monocytes Relative: 4.4 %
Neutro Abs: 4471 cells/uL (ref 1500–7800)
Neutrophils Relative %: 82.8 %
Platelets: 243 10*3/uL (ref 140–400)
RBC: 4.18 10*6/uL — ABNORMAL LOW (ref 4.20–5.80)
RDW: 13 % (ref 11.0–15.0)
Total Lymphocyte: 11.8 %
WBC: 5.4 10*3/uL (ref 3.8–10.8)

## 2023-04-10 LAB — VITAMIN B12: Vitamin B-12: 183 pg/mL — ABNORMAL LOW (ref 200–1100)

## 2023-04-10 LAB — IRON,TIBC AND FERRITIN PANEL
%SAT: 27 % (calc) (ref 20–48)
Ferritin: 105 ng/mL (ref 24–380)
Iron: 86 ug/dL (ref 50–180)
TIBC: 318 mcg/dL (calc) (ref 250–425)

## 2023-04-10 LAB — RETICULOCYTES
ABS Retic: 75240 cells/uL (ref 25000–90000)
Retic Ct Pct: 1.8 %

## 2023-04-10 LAB — FOLATE RBC: RBC Folate: 496 ng/mL RBC (ref >280)

## 2023-04-25 ENCOUNTER — Other Ambulatory Visit: Payer: Self-pay | Admitting: Internal Medicine

## 2023-04-25 DIAGNOSIS — F5101 Primary insomnia: Secondary | ICD-10-CM

## 2023-04-30 ENCOUNTER — Other Ambulatory Visit: Payer: Self-pay | Admitting: Internal Medicine

## 2023-05-05 ENCOUNTER — Other Ambulatory Visit: Payer: Self-pay | Admitting: Internal Medicine

## 2023-05-06 DIAGNOSIS — H31002 Unspecified chorioretinal scars, left eye: Secondary | ICD-10-CM | POA: Diagnosis not present

## 2023-05-06 DIAGNOSIS — H43812 Vitreous degeneration, left eye: Secondary | ICD-10-CM | POA: Diagnosis not present

## 2023-05-06 DIAGNOSIS — H52203 Unspecified astigmatism, bilateral: Secondary | ICD-10-CM | POA: Diagnosis not present

## 2023-05-06 DIAGNOSIS — H5213 Myopia, bilateral: Secondary | ICD-10-CM | POA: Diagnosis not present

## 2023-05-07 ENCOUNTER — Ambulatory Visit: Payer: Medicare Other | Admitting: Internal Medicine

## 2023-05-07 NOTE — Progress Notes (Signed)
R E  Arthur Garcia  D                                                                                                                                                                                                                                            Future Appointments  Date Time Provider Department  05/07/2023 11:30 AM Lucky Cowboy, MD [facility]  07/06/2023  9:00 AM Adela Glimpse, NP [facility]  10/08/2023 10:30 AM Lucky Cowboy, MD [facility]  01/19/2024  2:00 PM Lucky Cowboy, MD [facility]    History of Present Illness:     The patient is a  very nice 72 y.o.  MWM  with HTN, HLD, Pre-Diabetes and Vitamin D Deficiency who presents with c/o Left ankle pain       Current Outpatient Medications on File Prior to Visit  Medication Sig   AMOXICILLIN PO Take by mouth. (Patient not taking: Reported on 03/31/2023)   atorvastatin (LIPITOR) 20 MG tablet TAKE 1 TABLET BY MOUTH EVERY DAY FOR CHOLESTEROL   Cholecalciferol (VITAMIN D-3) 5000 UNITS TABS Take by mouth daily. Alternates between 5000 and 16109   dexamethasone (DECADRON) 2 MG tablet TAKE 1 TAB EVERY OTHER DAY FOR PAIN & INFLAMMATION   losartan (COZAAR) 50 MG tablet Take 1 tablet  Daily  for BP   Magnesium 250 MG TABS Take by mouth. Takes 2 tablets daily   tadalafil (CIALIS) 5 MG tablet Take 1 tablet  Daily  for Prostate   traZODone (DESYREL) 100 MG tablet TAKE 1 TABLET 1-2 HOURS BEFORE BEDTIME AS NEEDED FOR SLEEP   No current facility-administered medications on file prior to visit.    No Known Allergies   Problem list He has Hyperlipidemia, mixed; Former smoker (45 pack year, quit 04/2020); ED (erectile dysfunction); Right bundle branch block (RBBB) with left  anterior fascicular block (LAFB); Essential hypertension; Vitamin D deficiency; Medication management; CKD (chronic kidney disease) stage 2, GFR 60-89 ml/min; Gastroesophageal reflux disease; Overweight (BMI 25.0-29.9); Acute medial meniscus tear of right knee; Primary osteoarthritis of right knee; Aortic atherosclerosis by Chest CT 02/13/2020; and Other emphysema (HCC) on their problem list.   Observations/Objective:  There were no vitals taken for this visit.  HEENT - WNL. Neck - supple.  Chest - Clear equal  BS. Cor - Nl HS. RRR w/o sig MGR. PP 1(+). No edema. MS- FROM w/o deformities.  Gait Nl. Neuro -  Nl w/o focal abnormalities.   Assessment and Plan:      Follow Up Instructions:        I discussed the assessment and treatment plan with the patient. The patient was provided an opportunity to ask questions and all were answered. The patient agreed with the plan and demonstrated an understanding of the instructions.       The patient was advised to call back or seek an in-person evaluation if the symptoms worsen or if the condition fails to improve as anticipated.    Arthur Maw, MD

## 2023-05-08 DIAGNOSIS — M7672 Peroneal tendinitis, left leg: Secondary | ICD-10-CM | POA: Diagnosis not present

## 2023-05-08 DIAGNOSIS — M25572 Pain in left ankle and joints of left foot: Secondary | ICD-10-CM | POA: Diagnosis not present

## 2023-05-10 NOTE — Progress Notes (Unsigned)
     C  A N  C  E L  L  E  D          

## 2023-05-11 ENCOUNTER — Ambulatory Visit: Payer: Medicare Other | Admitting: Internal Medicine

## 2023-05-27 DIAGNOSIS — H2512 Age-related nuclear cataract, left eye: Secondary | ICD-10-CM | POA: Diagnosis not present

## 2023-05-27 DIAGNOSIS — H43812 Vitreous degeneration, left eye: Secondary | ICD-10-CM | POA: Diagnosis not present

## 2023-05-28 DIAGNOSIS — S93492A Sprain of other ligament of left ankle, initial encounter: Secondary | ICD-10-CM | POA: Diagnosis not present

## 2023-05-28 DIAGNOSIS — M7672 Peroneal tendinitis, left leg: Secondary | ICD-10-CM | POA: Diagnosis not present

## 2023-06-15 DIAGNOSIS — M25672 Stiffness of left ankle, not elsewhere classified: Secondary | ICD-10-CM | POA: Diagnosis not present

## 2023-06-15 DIAGNOSIS — M25572 Pain in left ankle and joints of left foot: Secondary | ICD-10-CM | POA: Diagnosis not present

## 2023-06-24 DIAGNOSIS — M25572 Pain in left ankle and joints of left foot: Secondary | ICD-10-CM | POA: Diagnosis not present

## 2023-06-24 DIAGNOSIS — M25672 Stiffness of left ankle, not elsewhere classified: Secondary | ICD-10-CM | POA: Diagnosis not present

## 2023-06-29 DIAGNOSIS — M25672 Stiffness of left ankle, not elsewhere classified: Secondary | ICD-10-CM | POA: Diagnosis not present

## 2023-06-29 DIAGNOSIS — M25572 Pain in left ankle and joints of left foot: Secondary | ICD-10-CM | POA: Diagnosis not present

## 2023-07-06 ENCOUNTER — Encounter: Payer: Self-pay | Admitting: Nurse Practitioner

## 2023-07-06 ENCOUNTER — Ambulatory Visit (INDEPENDENT_AMBULATORY_CARE_PROVIDER_SITE_OTHER): Payer: Medicare Other | Admitting: Nurse Practitioner

## 2023-07-06 VITALS — BP 146/76 | HR 76 | Temp 97.9°F | Ht 74.0 in | Wt 218.0 lb

## 2023-07-06 DIAGNOSIS — E559 Vitamin D deficiency, unspecified: Secondary | ICD-10-CM

## 2023-07-06 DIAGNOSIS — Z79899 Other long term (current) drug therapy: Secondary | ICD-10-CM

## 2023-07-06 DIAGNOSIS — R7309 Other abnormal glucose: Secondary | ICD-10-CM | POA: Diagnosis not present

## 2023-07-06 DIAGNOSIS — M25672 Stiffness of left ankle, not elsewhere classified: Secondary | ICD-10-CM | POA: Diagnosis not present

## 2023-07-06 DIAGNOSIS — Z0000 Encounter for general adult medical examination without abnormal findings: Secondary | ICD-10-CM

## 2023-07-06 DIAGNOSIS — I10 Essential (primary) hypertension: Secondary | ICD-10-CM | POA: Diagnosis not present

## 2023-07-06 DIAGNOSIS — K219 Gastro-esophageal reflux disease without esophagitis: Secondary | ICD-10-CM

## 2023-07-06 DIAGNOSIS — R6889 Other general symptoms and signs: Secondary | ICD-10-CM

## 2023-07-06 DIAGNOSIS — Z720 Tobacco use: Secondary | ICD-10-CM

## 2023-07-06 DIAGNOSIS — I452 Bifascicular block: Secondary | ICD-10-CM

## 2023-07-06 DIAGNOSIS — N182 Chronic kidney disease, stage 2 (mild): Secondary | ICD-10-CM

## 2023-07-06 DIAGNOSIS — N528 Other male erectile dysfunction: Secondary | ICD-10-CM | POA: Diagnosis not present

## 2023-07-06 DIAGNOSIS — E782 Mixed hyperlipidemia: Secondary | ICD-10-CM

## 2023-07-06 DIAGNOSIS — Z23 Encounter for immunization: Secondary | ICD-10-CM | POA: Diagnosis not present

## 2023-07-06 DIAGNOSIS — I700 Atherosclerosis of aorta: Secondary | ICD-10-CM | POA: Diagnosis not present

## 2023-07-06 DIAGNOSIS — M25572 Pain in left ankle and joints of left foot: Secondary | ICD-10-CM | POA: Diagnosis not present

## 2023-07-06 DIAGNOSIS — Z0001 Encounter for general adult medical examination with abnormal findings: Secondary | ICD-10-CM | POA: Diagnosis not present

## 2023-07-06 NOTE — Patient Instructions (Signed)

## 2023-07-06 NOTE — Progress Notes (Signed)
MEDICARE ANNUAL WELLNESS VISIT AND FOLLOW UP Assessment:   Diagnoses and all orders for this visit:  Medicare annual wellness visit, subsequent Due annually  Health maintenance reviewed Healthily lifestyle goals set  Atherosclerosis of aorta Per CT 01/2020 Control blood pressure, cholesterol, glucose, increase exercise.   Essential hypertension Discussed DASH (Dietary Approaches to Stop Hypertension) DASH diet is lower in sodium than a typical American diet. Cut back on foods that are high in saturated fat, cholesterol, and trans fats. Eat more whole-grain foods, fish, poultry, and nuts Remain active and exercise as tolerated daily.  Monitor BP at home-Call if greater than 130/80.  Check CMP/CBC  Other male erectile dysfunction Treated by Cialis PRN; no concerns or complications Established with urology; follow up PRN  Hyperlipidemia, unspecified hyperlipidemia type Discussed lifestyle modifications. Recommended diet heavy in fruits and veggies, omega 3's. Decrease consumption of animal meats, cheeses, and dairy products. Remain active and exercise as tolerated. Continue to monitor. Check lipids/TSH  TOBACCO ABUSE/Emphysema noted on CT 01/2021 PT stop quitting smoking 04/2020 CT screening emphysema,  Lung-RADS 1, negative.  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 (LAFB) Evaluated by cardiology; no further interventions recommended unless progresses and symptomatic Continue annual EKG and as indicated  Other abnormal glucose Education: Reviewed 'ABCs' of diabetes management  Discussed goals to be met and/or maintained include A1C (<7) Blood pressure (<130/80) Cholesterol (LDL <70) Continue Eye Exam yearly  Continue Dental Exam Q6 mo Discussed dietary recommendations Discussed Physical Activity recommendations Foot exam UTD Check A1C  Vitamin D deficiency At goal; continue supplementation Check vitamin D  level   Medication management All medications discussed and reviewed in full. All questions and concerns regarding medications addressed.    CKD (chronic kidney disease) stage 2, GFR 60-89 ml/min Continue medications Discussed how what you eat and drink can aide in kidney protection. Stay well hydrated. Avoid high salt foods. Avoid NSAIDS. Keep BP and BG well controlled.   Take medications as prescribed. Remain active and exercise as tolerated daily. Maintain weight.  Continue to monitor. Check CMP/GFR/Microablumin  GERD No suspected reflux complications (Barret/stricture). Lifestyle modification:  wt loss, avoid meals 2-3h before bedtime. Consider eliminating food triggers:  chocolate, caffeine, EtOH, acid/spicy food.  Need for flu Vaccine High dose vaccine administered. Patient tolerated well  Orders Placed This Encounter  Procedures   Flu vaccine HIGH DOSE PF   CBC with Differential/Platelet   COMPLETE METABOLIC PANEL WITH GFR   Lipid panel   Hemoglobin A1c   Notify office for further evaluation and treatment, questions or concerns if any reported s/s fail to improve.   The patient was advised to call back or seek an in-person evaluation if any symptoms worsen or if the condition fails to improve as anticipated.   Further disposition pending results of labs. Discussed med's effects and SE's.    I discussed the assessment and treatment plan with the patient. The patient was provided an opportunity to ask questions and all were answered. The patient agreed with the plan and demonstrated an understanding of the instructions.  Discussed med's effects and SE's. Screening labs and tests as requested with regular follow-up as recommended.  I provided 35 minutes of face-to-face time during this encounter including counseling, chart review, and critical decision making was preformed.   Future Appointments  Date Time Provider Department Center  10/08/2023 10:30 AM Lucky Cowboy, MD [facility] None  01/19/2024  2:00 PM Lucky Cowboy, MD [facility] None  07/05/2024  9:00 AM Gauri Galvao, Archie Patten, NP [facility] None     Plan:   During the course of the visit the patient was educated and counseled about appropriate screening and preventive services including:   Pneumococcal vaccine  Influenza vaccine Prevnar 13 Td vaccine Screening electrocardiogram Colorectal cancer screening Diabetes screening Glaucoma screening Nutrition counseling    Subjective:  Arthur Garcia is a 72 y.o. male who presents for Medicare Annual Wellness Visit and 3 month follow up for HTN, hyperlipidemia, glucose management (hx of prediabetes), and vitamin D Def.   Overall he reports feeling well. He has no new concerns at this time.  He is continuing to follow the Orthopedics for right ankle pain.  Currently in PT and has 3  more sessions.  Managing pain with Meloxicam 7.25 mg.  If not succession will have MRI and further evaluation.  He had a visit to the ED on 04/2022 for dizziness.  Understands now this occurs most when taking Viagra for ED.  He is continuing to monitor symptoms and stay well hydrated. Erectile dysfunction progressively worsening over 5-10 year period. Follows with Dr Cardell Peach urologist. Interested impossibly changing Valsartan to Losartan to see if this helps symptoms.  He has hx of DDD s/p lumbar discectomy x 2 by Dr. Venetia Maxon in 2016 - he continues to experience pain and discomfort related to this, currently prescribed norco and neurontin and managed by pain management via Dr. Fredrich Birks office.   Pt quit smoking 04/2020. He has 30+ pack/yr smoking history and recently underwent low dose CT screening in 01/2022 which was negative other than aortic atherosclerosis, and emphysema  He reports left ear hearing loss due to shooting guns.  Wife reports "he can't hear."  He has not had prior evaluation in the past.   BMI is Body mass index is 27.99 kg/m., he has been working on  diet and exercise, Wt Readings from Last 3 Encounters:  07/06/23 218 lb (98.9 kg)  04/08/23 217 lb (98.4 kg)  03/31/23 215 lb 6.4 oz (97.7 kg)   He has not been checking BPs at home regularly, today their BP is BP: (!) 146/76  Bps at home are running 120-130/80's wife is a Charity fundraiser and checks for him.  BP Readings from Last 3 Encounters:  07/06/23 (!) 146/76  04/08/23 (!) 150/74  03/31/23 136/78   He is taking valsartan 160mg  daily  He does workout. He denies chest pain, shortness of breath, dizziness.   He is on cholesterol medication (atorvastatin 20 mg daily) and denies myalgias. His cholesterol is at goal. The cholesterol last visit was:   Lab Results  Component Value Date   CHOL 125 03/31/2023   HDL 47 03/31/2023   LDLCALC 64 03/31/2023   TRIG 55 03/31/2023   CHOLHDL 2.7 03/31/2023   He has been working on diet and exercise for glucose management, and denies increased appetite, nausea, paresthesia of the feet, polydipsia, polyuria, visual disturbances and vomiting. Last A1C in the office was:  Lab Results  Component Value Date   HGBA1C 6.0 (H) 03/31/2023   Last GFR Lab Results  Component Value Date   GFRNONAA 85 (L) 05/28/2022    Patient is on Vitamin D supplement and at goal at most recent check:    Lab Results  Component Value Date   VD25OH 95 03/31/2023      Medication Review: Current Outpatient Medications on File Prior to Visit  Medication Sig Dispense Refill   atorvastatin (LIPITOR) 20 MG tablet TAKE 1  TABLET BY MOUTH EVERY DAY FOR CHOLESTEROL 90 tablet 1   Cholecalciferol (VITAMIN D-3) 5000 UNITS TABS Take by mouth daily. Alternates between 5000 and 53664     losartan (COZAAR) 50 MG tablet Take 1 tablet  Daily  for BP 90 tablet 3   MELOXICAM PO Take by mouth. Take 1/2 tablet twice daily     tadalafil (CIALIS) 5 MG tablet Take 1 tablet  Daily  for Prostate 90 tablet 3   traZODone (DESYREL) 100 MG tablet TAKE 1 TABLET 1-2 HOURS BEFORE BEDTIME AS NEEDED FOR SLEEP 90  tablet 3   dexamethasone (DECADRON) 2 MG tablet TAKE 1 TAB EVERY OTHER DAY FOR PAIN & INFLAMMATION 30 tablet 0   Magnesium 250 MG TABS Take by mouth. Takes 2 tablets daily (Patient not taking: Reported on 07/06/2023)     No current facility-administered medications on file prior to visit.    Allergies: No Known Allergies  Current Problems (verified) has Hyperlipidemia, mixed; Former smoker (45 pack year, quit 04/2020); ED (erectile dysfunction); Right bundle branch block (RBBB) with left anterior fascicular block (LAFB); Essential hypertension; Vitamin D deficiency; Medication management; CKD (chronic kidney disease) stage 2, GFR 60-89 ml/min; Gastroesophageal reflux disease; Overweight (BMI 25.0-29.9); Acute medial meniscus tear of right knee; Primary osteoarthritis of right knee; Aortic atherosclerosis by Chest CT 02/13/2020; and Other emphysema (HCC) on their problem list.  Screening Tests Immunization History  Administered Date(s) Administered   Influenza Split 07/05/2014   Influenza, High Dose Seasonal PF 08/21/2015, 08/09/2019, 08/28/2020, 07/04/2021, 07/04/2022, 07/06/2023   PFIZER(Purple Top)SARS-COV-2 Vaccination 01/26/2020, 02/20/2020, 09/15/2020   Pneumococcal Conjugate-13 05/20/2016   Pneumococcal Polysaccharide-23 10/06/2012, 11/14/2019   Td 09/29/2006, 07/10/2017   Zoster, Live 08/21/2015   Preventative care: Last colonoscopy: 2008  Cologuard: 08/2021 Negative.  Due 2025  Smoker - CT lung 02/13/2022, + aortic atherosclerosis and emphysema  Prior vaccinations: TD or Tdap: 2018  Influenza: Administered 06/2022 Pneumococcal: 2014, 2021 Prevnar13: 2017 Shingles/Zostavax: 2016 Covid 19: 1/2, 2021, pfizer has next scheduled on Monday   Names of Other Physician/Practitioners you currently use: 1. [facility] here for primary care 2. Dr. Emily Filbert, eye doctor, last visit 2022, goes q12 months, no issues  3. Dr. Providence Lanius, dentist, last visit  2022, goes q57m   Patient Care Team: Lucky Cowboy, MD as PCP - General (Internal Medicine) Maeola Harman, MD as Consulting Physician (Neurosurgery) Meryl Dare, MD as Consulting Physician (Gastroenterology) Elise Benne, MD as Consulting Physician (Ophthalmology) Rollene Rotunda, MD as Consulting Physician (Cardiology) Cherlyn Roberts, MD as Consulting Physician (Dermatology)  Surgical: He  has a past surgical history that includes Uvuectomy; Appendectomy; Vasectomy; Adenoidectomy; Spine surgery (Aug 2016); Dental surgery; and Laparoscopic cholecystectomy (05/2020). Family His family history includes Glaucoma in his mother; Heart disease in his father; Heart disease (age of onset: 38) in his paternal grandfather; Hyperlipidemia in his father; Hypertension in his father and mother; Osteoporosis in his mother; Thyroid disease in his father and mother. Social history  He reports that he quit smoking about 3 years ago. His smoking use included cigarettes. He started smoking about 48 years ago. He has a 45 pack-year smoking history. He has never used smokeless tobacco. He reports current alcohol use of about 20.0 standard drinks of alcohol per week. He reports that he does not use drugs.  MEDICARE WELLNESS OBJECTIVES: Physical activity: Current Exercise Habits: Home exercise routine Cardiac risk factors:   Depression/mood screen:      07/06/2023    9:36 AM  Depression screen PHQ 2/9  Decreased Interest 0  Down, Depressed, Hopeless 0  PHQ - 2 Score 0    ADLs:     07/06/2023    9:36 AM  In your present state of health, do you have any difficulty performing the following activities:  Hearing? 0  Vision? 0  Difficulty concentrating or making decisions? 0  Walking or climbing stairs? 0  Dressing or bathing? 0  Doing errands, shopping? 0     Cognitive Testing  Alert? Yes  Normal Appearance?Yes  Oriented to person? Yes  Place? Yes   Time? Yes  Recall of three objects?   Yes  Can perform simple calculations? Yes  Displays appropriate judgment?Yes  Can read the correct time from a watch face?Yes  EOL planning:    Review of Systems  Constitutional:  Negative for chills, fever and weight loss.  HENT:  Negative for congestion and hearing loss.   Eyes:  Negative for blurred vision and double vision.  Respiratory:  Negative for cough and shortness of breath.   Cardiovascular:  Negative for chest pain, palpitations, orthopnea and leg swelling.  Gastrointestinal:  Negative for abdominal pain, constipation, diarrhea, heartburn, nausea and vomiting.  Genitourinary:        ED worsening  Musculoskeletal:  Positive for back pain and myalgias (hamstrings bilaterally). Negative for falls and joint pain.  Skin:  Negative for rash.  Neurological:  Negative for dizziness, tingling, tremors, loss of consciousness and headaches.  Endo/Heme/Allergies:  Does not bruise/bleed easily.  Psychiatric/Behavioral:  Negative for depression, memory loss and suicidal ideas.     Objective:   Today's Vitals   07/06/23 0906  BP: (!) 146/76  Pulse: 76  Temp: 97.9 F (36.6 C)  SpO2: 97%  Weight: 218 lb (98.9 kg)  Height: 6\' 2"  (1.88 m)     Body mass index is 27.99 kg/m.  General Appearance: Well nourished, in no apparent distress. Eyes: PERRLA, EOMs, conjunctiva no swelling or erythema Sinuses: No Frontal/maxillary tenderness ENT/Mouth: Ext aud canals clear, TMs without erythema, bulging. No erythema, swelling, or exudate on post pharynx.  Tonsils not swollen or erythematous. Hearing normal.  Neck: Supple, thyroid normal.  Respiratory: Respiratory effort normal, BS equal bilaterally without rales, rhonchi, wheezing or stridor.  Cardio: RRR with no MRGs. Brisk peripheral pulses without edema.  Abdomen: Soft, + BS.  Non tender, no guarding, rebound, hernias, masses. Lymphatics: Non tender without lymphadenopathy.  Musculoskeletal: Full ROM, 5/5 strength, Normal gait Skin:  Warm, dry without rashes, lesions, ecchymosis.  Neuro: Cranial nerves intact. No cerebellar symptoms.  Psych: Awake and oriented X 3, normal affect, Insight and Judgment appropriate.    Medicare Attestation I have personally reviewed: The patient's medical and social history Their use of alcohol, tobacco or illicit drugs Their current medications and supplements The patient's functional ability including ADLs,fall risks, home safety risks, cognitive, and hearing and visual impairment Diet and physical activities Evidence for depression or mood disorders  The patient's weight, height, BMI, and visual acuity have been recorded in the chart.  I have made referrals, counseling, and provided education to the patient based on review of the above and I have provided the patient with a written personalized care plan for preventive services.     Adela Glimpse, NP [facility]  07/06/2023

## 2023-07-07 LAB — CBC WITH DIFFERENTIAL/PLATELET
Absolute Monocytes: 470 {cells}/uL (ref 200–950)
Basophils Absolute: 52 {cells}/uL (ref 0–200)
Basophils Relative: 1.1 %
Eosinophils Absolute: 150 {cells}/uL (ref 15–500)
Eosinophils Relative: 3.2 %
HCT: 41.6 % (ref 38.5–50.0)
Hemoglobin: 13.7 g/dL (ref 13.2–17.1)
Lymphs Abs: 996 {cells}/uL (ref 850–3900)
MCH: 31.1 pg (ref 27.0–33.0)
MCHC: 32.9 g/dL (ref 32.0–36.0)
MCV: 94.3 fL (ref 80.0–100.0)
MPV: 10.4 fL (ref 7.5–12.5)
Monocytes Relative: 10 %
Neutro Abs: 3032 {cells}/uL (ref 1500–7800)
Neutrophils Relative %: 64.5 %
Platelets: 221 10*3/uL (ref 140–400)
RBC: 4.41 10*6/uL (ref 4.20–5.80)
RDW: 11.5 % (ref 11.0–15.0)
Total Lymphocyte: 21.2 %
WBC: 4.7 10*3/uL (ref 3.8–10.8)

## 2023-07-07 LAB — COMPLETE METABOLIC PANEL WITH GFR
AG Ratio: 1.9 (calc) (ref 1.0–2.5)
ALT: 16 U/L (ref 9–46)
AST: 19 U/L (ref 10–35)
Albumin: 4.3 g/dL (ref 3.6–5.1)
Alkaline phosphatase (APISO): 62 U/L (ref 35–144)
BUN: 18 mg/dL (ref 7–25)
CO2: 27 mmol/L (ref 20–32)
Calcium: 9.9 mg/dL (ref 8.6–10.3)
Chloride: 104 mmol/L (ref 98–110)
Creat: 1.15 mg/dL (ref 0.70–1.28)
Globulin: 2.3 g/dL (ref 1.9–3.7)
Glucose, Bld: 120 mg/dL — ABNORMAL HIGH (ref 65–99)
Potassium: 4.3 mmol/L (ref 3.5–5.3)
Sodium: 140 mmol/L (ref 135–146)
Total Bilirubin: 0.5 mg/dL (ref 0.2–1.2)
Total Protein: 6.6 g/dL (ref 6.1–8.1)
eGFR: 68 mL/min/{1.73_m2} (ref >=60)

## 2023-07-07 LAB — LIPID PANEL
Cholesterol: 116 mg/dL (ref <200)
HDL: 42 mg/dL (ref >=40)
LDL Cholesterol (Calc): 54 mg/dL
Non-HDL Cholesterol (Calc): 74 mg/dL (ref <130)
Total CHOL/HDL Ratio: 2.8 (calc) (ref <5.0)
Triglycerides: 121 mg/dL (ref <150)

## 2023-07-07 LAB — HEMOGLOBIN A1C
Hgb A1c MFr Bld: 5.9 %{Hb} — ABNORMAL HIGH (ref <5.7)
Mean Plasma Glucose: 123 mg/dL
eAG (mmol/L): 6.8 mmol/L

## 2023-07-20 DIAGNOSIS — M25672 Stiffness of left ankle, not elsewhere classified: Secondary | ICD-10-CM | POA: Diagnosis not present

## 2023-07-20 DIAGNOSIS — M25572 Pain in left ankle and joints of left foot: Secondary | ICD-10-CM | POA: Diagnosis not present

## 2023-07-22 ENCOUNTER — Other Ambulatory Visit: Payer: Self-pay | Admitting: Nurse Practitioner

## 2023-07-23 ENCOUNTER — Other Ambulatory Visit: Payer: Self-pay | Admitting: Nurse Practitioner

## 2023-07-23 DIAGNOSIS — Z79899 Other long term (current) drug therapy: Secondary | ICD-10-CM

## 2023-07-23 DIAGNOSIS — I10 Essential (primary) hypertension: Secondary | ICD-10-CM

## 2023-07-27 DIAGNOSIS — M25572 Pain in left ankle and joints of left foot: Secondary | ICD-10-CM | POA: Diagnosis not present

## 2023-07-27 DIAGNOSIS — M25672 Stiffness of left ankle, not elsewhere classified: Secondary | ICD-10-CM | POA: Diagnosis not present

## 2023-07-28 DIAGNOSIS — M7672 Peroneal tendinitis, left leg: Secondary | ICD-10-CM | POA: Diagnosis not present

## 2023-07-28 DIAGNOSIS — S93492A Sprain of other ligament of left ankle, initial encounter: Secondary | ICD-10-CM | POA: Diagnosis not present

## 2023-07-29 DIAGNOSIS — L821 Other seborrheic keratosis: Secondary | ICD-10-CM | POA: Diagnosis not present

## 2023-07-29 DIAGNOSIS — D225 Melanocytic nevi of trunk: Secondary | ICD-10-CM | POA: Diagnosis not present

## 2023-07-29 DIAGNOSIS — L538 Other specified erythematous conditions: Secondary | ICD-10-CM | POA: Diagnosis not present

## 2023-07-29 DIAGNOSIS — D2262 Melanocytic nevi of left upper limb, including shoulder: Secondary | ICD-10-CM | POA: Diagnosis not present

## 2023-07-29 DIAGNOSIS — L820 Inflamed seborrheic keratosis: Secondary | ICD-10-CM | POA: Diagnosis not present

## 2023-07-29 DIAGNOSIS — D2261 Melanocytic nevi of right upper limb, including shoulder: Secondary | ICD-10-CM | POA: Diagnosis not present

## 2023-07-29 DIAGNOSIS — L814 Other melanin hyperpigmentation: Secondary | ICD-10-CM | POA: Diagnosis not present

## 2023-07-29 DIAGNOSIS — Z85828 Personal history of other malignant neoplasm of skin: Secondary | ICD-10-CM | POA: Diagnosis not present

## 2023-07-29 DIAGNOSIS — Z08 Encounter for follow-up examination after completed treatment for malignant neoplasm: Secondary | ICD-10-CM | POA: Diagnosis not present

## 2023-08-17 DIAGNOSIS — M25572 Pain in left ankle and joints of left foot: Secondary | ICD-10-CM | POA: Diagnosis not present

## 2023-08-25 DIAGNOSIS — S93492A Sprain of other ligament of left ankle, initial encounter: Secondary | ICD-10-CM | POA: Diagnosis not present

## 2023-08-25 DIAGNOSIS — M7672 Peroneal tendinitis, left leg: Secondary | ICD-10-CM | POA: Diagnosis not present

## 2023-10-07 NOTE — Progress Notes (Signed)
 Arthur Garcia      ADULT   &   ADOLESCENT      INTERNAL MEDICINE  Arthur Garcia, M.D.          Lonell Rous, ANP        Bascom Necessary, FNP  [facility] [address]  [place], [place]. [address] Telephone [phone] Telefax [phone]                                                                                         Future Appointments  Date Time Provider Department  01/19/2024             cpe  2:00 PM Garcia Elsie, MD [facility]  07/05/2024              wellness  9:00 AM Necessary Bascom, NP [facility]    History of Present Illness:      This very nice 73 y.o.  MWM  presents for 6  month follow up with HTN, HLD, Pre-Diabetes and Vitamin D  Deficiency. Chest CT in 2021 showed Aortic Atherosclerosis.           Patient is treated for HTN  since  2001  & BP has been controlled at home. Today's BP  is  at goal -  138/70 . Patient has had no complaints of any cardiac type chest pain, palpitations, dyspnea chet /PND, dizziness, claudication or dependent edema.        Hyperlipidemia is controlled with diet & Atorvastatin . Patient denies myalgias or other med SE's. Last Lipids were at goal:  Lab Results  Component Value Date   CHOL 116 07/06/2023   HDL 42 07/06/2023   LDLCALC 54 07/06/2023   TRIG 121 07/06/2023   CHOLHDL 2.8 07/06/2023     Also, the patient has history of  glucose intolerance  /PreDiabetes  (A1c 5.9% /2015) and has had no symptoms of reactive hypoglycemia, diabetic polys, paresthesias or visual blurring.  Last A1c was near Normal:  Lab Results  Component Value Date   HGBA1C 5.9 (H) 07/06/2023   Wt Readings from Last 3 Encounters:  10/08/23 221 lb 9.6 oz (100.5 kg)  07/06/23 218 lb (98.9 kg)  04/08/23 217 lb (98.4 kg)                                                          Further, the patient also has history of Vitamin D  Deficiency and supplements vitamin D  without any suspected side-effects. Last vitamin D   was at goal:  Lab Results  Component Value Date   VD25OH 95 03/31/2023     Current Outpatient Medications  Medication Instructions   atorvastatin  20 MG tablet TAKE 1 TABLET  EVERY DAY    VITAMIN D  5000 u    Alternates between 5000 and 10,000    losartan   50 MG tablet TAKE 1 TABLET DAILY    MELOXICAM  15 mg 1/2 tablet twice daily   tadalafil   5  MG tablet Take 1 tablet  Daily    traZODone   100 MG tablet TAKE 1 TABLET 1-2 HOURS BEFORE BEDTIME AS NEEDED FOR SLEEP    No Known Allergies   PMHx:   Past Medical History:  Diagnosis Date   Abnormal liver function test    Anxiety    Colon polyp    DDD (degenerative disc disease)    ED (erectile dysfunction) 04/05/2014   FHx: heart disease 01/28/2018   GERD (gastroesophageal reflux disease)    Hyperlipidemia    Hypertension    Insomnia    Tobacco dependence      Immunization History  Administered Date(s) Administered   Influenza Split 07/05/2014   Influenza, High Dose   08/28/2020, 07/04/2021, 07/04/2022   PFIZER-SARS-COV-2 Vacc 01/26/2020, 02/20/2020, 09/15/2020   Pneumococcal -13 05/20/2016   Pneumococcal -23 10/06/2012, 11/14/2019   Td 09/29/2006, 07/10/2017   Zoster, Live 08/21/2015     Past Surgical History:  Procedure Laterality Date   ADENOIDECTOMY     age 69   APPENDECTOMY     DENTAL SURGERY     Dental implants   LAPAROSCOPIC CHOLECYSTECTOMY  05/2020   At surgery center, Dr. Stevie CURLIN SURGERY  Aug 2016   Dr. Unice Lah     VASECTOMY      FHx:    Reviewed / unchanged   SHx:    Reviewed / unchanged    Systems Review:  Constitutional: Denies fever, chills, wt changes, headaches, insomnia, fatigue, night sweats, change in appetite. Eyes: Denies redness, blurred vision, diplopia, discharge, itchy, watery eyes.  ENT: Denies discharge, congestion, post nasal drip, epistaxis, sore throat, earache, hearing loss, dental pain, tinnitus, vertigo, sinus pain, snoring.  CV: Denies chest pain,  palpitations, irregular heartbeat, syncope, dyspnea, diaphoresis, orthopnea, PND, claudication or edema. Respiratory: denies cough, dyspnea, DOE, pleurisy, hoarseness, laryngitis, wheezing.  Gastrointestinal: Denies dysphagia, odynophagia, heartburn, reflux, water brash, abdominal pain or cramps, nausea, vomiting, bloating, diarrhea, constipation, hematemesis, melena, hematochezia  or hemorrhoids. Genitourinary: Denies dysuria, frequency, urgency, nocturia, hesitancy, discharge, hematuria or flank pain. Musculoskeletal: Denies arthralgias, myalgias, stiffness, jt. swelling, pain, limping or strain/sprain.  Skin: Denies pruritus, rash, hives, warts, acne, eczema or change in skin lesion(s). Neuro: No weakness, tremor, incoordination, spasms, paresthesia or pain. Psychiatric: Denies confusion, memory loss or sensory loss. Endo: Denies change in weight, skin or hair change.  Heme/Lymph: No excessive bleeding, bruising or enlarged lymph nodes.   Physical Exam  BP 138/70   Pulse 71   Temp 98 F (36.7 C)   Resp 16   Ht 6' 2 (1.88 m)   Wt 221 lb 9.6 oz (100.5 kg)   SpO2 98%   BMI 28.45 kg/m   Appears  well nourished, well groomed  and in no distress.  Eyes: PERRLA, EOMs, conjunctiva no swelling or erythema. Sinuses: No frontal/maxillary tenderness ENT/Mouth: EAC's clear, TM's nl w/o erythema, bulging. Nares clear w/o erythema, swelling, exudates. Oropharynx clear without erythema or exudates. Oral hygiene is good. Tongue normal, non obstructing. Hearing intact.  Neck: Supple. Thyroid  not palpable. Car 2+/2+ without bruits, nodes or JVD. Chest: Respirations nl with BS clear & equal w/o rales, rhonchi, wheezing or stridor.  Cor: Heart sounds normal w/ regular rate and rhythm without sig. murmurs, gallops, clicks or rubs. Peripheral pulses normal and equal  without edema.  Abdomen: Soft & bowel sounds normal. Non-tender w/o guarding, rebound, hernias, masses or organomegaly.  Lymphatics:  Unremarkable.  Musculoskeletal: Full ROM all peripheral extremities, joint stability,  5/5 strength and normal gait.  Skin: Warm, dry without exposed rashes, lesions or ecchymosis apparent.  Neuro: Cranial nerves intact, reflexes equal bilaterally. Sensory-motor testing grossly intact. Tendon reflexes grossly intact.  Pysch: Alert & oriented x 3.  Insight and judgement nl & appropriate. No ideations.  Assessment and Plan:   1. Essential hypertension (Primary)  - Continue medication, monitor blood pressure at home.  - Continue DASH diet.  Reminder to go to the ER if any CP,  SOB, nausea, dizziness, severe HA, changes vision/speech.   - CBC with Differential/Platelet - COMPLETE METABOLIC PANEL WITH GFR - Magnesium - TSH   2. Hyperlipidemia, mixed  - Continue diet/meds, exercise,& lifestyle modifications.  - Continue monitor periodic cholesterol/liver & renal functions                                                  - Lipid panel - TSH   3. Abnormal glucose  - Continue diet, exercise  - Lifestyle modifications.  - Monitor appropriate labs.   - Hemoglobin A1c - Insulin , random   4. Vitamin D  deficiency  - VITAMIN D  25 Hydroxy    5. Aortic atherosclerosis (HCC)  - Lipid panel   6. Medication management  - CBC with Differential/Platelet - COMPLETE METABOLIC PANEL WITH GFR - Magnesium - Lipid panel - TSH - Hemoglobin A1c - Insulin , random - VITAMIN D  25 Hydroxy  - Continue supplementation.        Discussed  regular exercise, BP monitoring, weight control to achieve/maintain BMI less than 25 and discussed med and SE's. Recommended labs to assess and monitor clinical status with further disposition pending results of labs.  I discussed the assessment and treatment plan with the patient. The patient was provided an opportunity to ask questions and all were answered. The patient agreed with the plan and demonstrated an understanding of the instructions.  I provided  over 30 minutes of exam, counseling, chart review and  complex critical decision making.          The patient was advised to call back or seek an in-person evaluation if the symptoms worsen or if the condition fails to improve as anticipated.   Arthur JONETTA Richards, MD

## 2023-10-08 ENCOUNTER — Ambulatory Visit (INDEPENDENT_AMBULATORY_CARE_PROVIDER_SITE_OTHER): Payer: Medicare Other | Admitting: Internal Medicine

## 2023-10-08 VITALS — BP 138/70 | HR 71 | Temp 98.0°F | Resp 16 | Ht 74.0 in | Wt 221.6 lb

## 2023-10-08 DIAGNOSIS — I700 Atherosclerosis of aorta: Secondary | ICD-10-CM

## 2023-10-08 DIAGNOSIS — I10 Essential (primary) hypertension: Secondary | ICD-10-CM

## 2023-10-08 DIAGNOSIS — E559 Vitamin D deficiency, unspecified: Secondary | ICD-10-CM | POA: Diagnosis not present

## 2023-10-08 DIAGNOSIS — Z79899 Other long term (current) drug therapy: Secondary | ICD-10-CM

## 2023-10-08 DIAGNOSIS — R7309 Other abnormal glucose: Secondary | ICD-10-CM

## 2023-10-08 DIAGNOSIS — E782 Mixed hyperlipidemia: Secondary | ICD-10-CM

## 2023-10-08 NOTE — Patient Instructions (Signed)

## 2023-10-09 LAB — HEMOGLOBIN A1C
Hgb A1c MFr Bld: 5.8 %{Hb} — ABNORMAL HIGH (ref <5.7)
Mean Plasma Glucose: 120 mg/dL
eAG (mmol/L): 6.6 mmol/L

## 2023-10-09 LAB — CBC WITH DIFFERENTIAL/PLATELET
Absolute Lymphocytes: 816 {cells}/uL — ABNORMAL LOW (ref 850–3900)
Absolute Monocytes: 320 {cells}/uL (ref 200–950)
Basophils Absolute: 41 {cells}/uL (ref 0–200)
Basophils Relative: 1.2 %
Eosinophils Absolute: 99 {cells}/uL (ref 15–500)
Eosinophils Relative: 2.9 %
HCT: 39.9 % (ref 38.5–50.0)
Hemoglobin: 13.5 g/dL (ref 13.2–17.1)
MCH: 30.7 pg (ref 27.0–33.0)
MCHC: 33.8 g/dL (ref 32.0–36.0)
MCV: 90.7 fL (ref 80.0–100.0)
MPV: 10.9 fL (ref 7.5–12.5)
Monocytes Relative: 9.4 %
Neutro Abs: 2125 {cells}/uL (ref 1500–7800)
Neutrophils Relative %: 62.5 %
Platelets: 207 10*3/uL (ref 140–400)
RBC: 4.4 10*6/uL (ref 4.20–5.80)
RDW: 12 % (ref 11.0–15.0)
Total Lymphocyte: 24 %
WBC: 3.4 10*3/uL — ABNORMAL LOW (ref 3.8–10.8)

## 2023-10-09 LAB — COMPLETE METABOLIC PANEL WITH GFR
AG Ratio: 2 (calc) (ref 1.0–2.5)
ALT: 14 U/L (ref 9–46)
AST: 18 U/L (ref 10–35)
Albumin: 4.3 g/dL (ref 3.6–5.1)
Alkaline phosphatase (APISO): 65 U/L (ref 35–144)
BUN: 15 mg/dL (ref 7–25)
CO2: 27 mmol/L (ref 20–32)
Calcium: 9.6 mg/dL (ref 8.6–10.3)
Chloride: 101 mmol/L (ref 98–110)
Creat: 1.16 mg/dL (ref 0.70–1.28)
Globulin: 2.2 g/dL (ref 1.9–3.7)
Glucose, Bld: 113 mg/dL — ABNORMAL HIGH (ref 65–99)
Potassium: 4.1 mmol/L (ref 3.5–5.3)
Sodium: 137 mmol/L (ref 135–146)
Total Bilirubin: 0.5 mg/dL (ref 0.2–1.2)
Total Protein: 6.5 g/dL (ref 6.1–8.1)
eGFR: 67 mL/min/{1.73_m2} (ref >=60)

## 2023-10-09 LAB — MAGNESIUM: Magnesium: 1.8 mg/dL (ref 1.5–2.5)

## 2023-10-09 LAB — LIPID PANEL
Cholesterol: 115 mg/dL (ref <200)
HDL: 46 mg/dL (ref >=40)
LDL Cholesterol (Calc): 55 mg/dL
Non-HDL Cholesterol (Calc): 69 mg/dL (ref <130)
Total CHOL/HDL Ratio: 2.5 (calc) (ref <5.0)
Triglycerides: 65 mg/dL (ref <150)

## 2023-10-09 LAB — TSH: TSH: 1.18 m[IU]/L (ref 0.40–4.50)

## 2023-10-09 LAB — INSULIN, RANDOM: Insulin: 18.1 u[IU]/mL

## 2023-10-09 LAB — VITAMIN D 25 HYDROXY (VIT D DEFICIENCY, FRACTURES): Vit D, 25-Hydroxy: 102 ng/mL — ABNORMAL HIGH (ref 30–100)

## 2023-10-11 ENCOUNTER — Encounter: Payer: Self-pay | Admitting: Internal Medicine

## 2023-10-11 NOTE — Progress Notes (Signed)
-   Test results slightly outside the reference range are not unusual. If there is anything important, I will review this with you,  otherwise it is considered normal test values.  If you have further questions,  please do not hesitate to contact me at the office or via My Chart.   =========================================================================  -  A1c = 5.8% is still  elevated in the borderline and                                                                               early or pre-diabetes range which has the same   300% increased risk for heart attack, stroke, cancer and                                                                   alzheimer- type vascular dementia as full blown diabetes.   But the good news is that diet, exercise with                                                                           weight loss can cure the early diabetes at this point.  =========================================================================  -  Vitamin D  = 102  - Excellent  - Please keep dosage same  =========================================================================  -   Magnesium  = 1.8 is very,   very  low- goal is betw 2.0 - 2.5,   - So..............Arthur Garcia  Recommend that you take                                                                  Magnesium 500 mg tablet 2 x / day with meals   - also important to eat lots of  leafy green vegetables   - spinach - Kale - collards - greens - okra - asparagus - broccoli - quinoa   - squash - almonds - black, red, white beans  -  peas - green beans  =========================================================================  -  Chol = 115   &   LDL = 55   - Both    Excellent  !   =========================================================================  -  All Else - CBC - Kidneys - Electrolytes - Liver & Thyroid     - all  Normal / OK  ===========================================================

## 2023-10-12 ENCOUNTER — Encounter (HOSPITAL_BASED_OUTPATIENT_CLINIC_OR_DEPARTMENT_OTHER): Payer: Self-pay | Admitting: Orthopaedic Surgery

## 2023-10-14 ENCOUNTER — Other Ambulatory Visit: Payer: Self-pay

## 2023-10-14 ENCOUNTER — Encounter (HOSPITAL_BASED_OUTPATIENT_CLINIC_OR_DEPARTMENT_OTHER): Payer: Self-pay | Admitting: Orthopaedic Surgery

## 2023-10-21 ENCOUNTER — Ambulatory Visit (HOSPITAL_BASED_OUTPATIENT_CLINIC_OR_DEPARTMENT_OTHER)
Admission: RE | Admit: 2023-10-21 | Discharge: 2023-10-21 | Disposition: A | Discharge status: Home / Self Care | Payer: Medicare Other | Attending: Orthopaedic Surgery | Admitting: Orthopaedic Surgery

## 2023-10-21 ENCOUNTER — Ambulatory Visit (HOSPITAL_BASED_OUTPATIENT_CLINIC_OR_DEPARTMENT_OTHER): Payer: Medicare Other

## 2023-10-21 ENCOUNTER — Encounter (HOSPITAL_BASED_OUTPATIENT_CLINIC_OR_DEPARTMENT_OTHER)
Admission: RE | Disposition: A | Discharge status: Home / Self Care | Payer: Self-pay | Source: Home / Self Care | Attending: Orthopaedic Surgery

## 2023-10-21 ENCOUNTER — Other Ambulatory Visit: Payer: Self-pay

## 2023-10-21 ENCOUNTER — Encounter (HOSPITAL_BASED_OUTPATIENT_CLINIC_OR_DEPARTMENT_OTHER): Payer: Self-pay | Admitting: Orthopaedic Surgery

## 2023-10-21 DIAGNOSIS — G8918 Other acute postprocedural pain: Secondary | ICD-10-CM | POA: Diagnosis not present

## 2023-10-21 DIAGNOSIS — I10 Essential (primary) hypertension: Secondary | ICD-10-CM | POA: Diagnosis not present

## 2023-10-21 DIAGNOSIS — J449 Chronic obstructive pulmonary disease, unspecified: Secondary | ICD-10-CM | POA: Insufficient documentation

## 2023-10-21 DIAGNOSIS — S93492A Sprain of other ligament of left ankle, initial encounter: Secondary | ICD-10-CM

## 2023-10-21 DIAGNOSIS — M65872 Other synovitis and tenosynovitis, left ankle and foot: Secondary | ICD-10-CM | POA: Diagnosis not present

## 2023-10-21 DIAGNOSIS — M25372 Other instability, left ankle: Secondary | ICD-10-CM | POA: Insufficient documentation

## 2023-10-21 DIAGNOSIS — M7672 Peroneal tendinitis, left leg: Secondary | ICD-10-CM | POA: Diagnosis not present

## 2023-10-21 DIAGNOSIS — S93491A Sprain of other ligament of right ankle, initial encounter: Secondary | ICD-10-CM | POA: Diagnosis not present

## 2023-10-21 DIAGNOSIS — M94272 Chondromalacia, left ankle and joints of left foot: Secondary | ICD-10-CM | POA: Diagnosis not present

## 2023-10-21 DIAGNOSIS — Z87891 Personal history of nicotine dependence: Secondary | ICD-10-CM | POA: Insufficient documentation

## 2023-10-21 DIAGNOSIS — Z01818 Encounter for other preprocedural examination: Secondary | ICD-10-CM

## 2023-10-21 HISTORY — PX: REPAIR OF PERONEUS BREVIS TENDON: SHX6215

## 2023-10-21 HISTORY — PX: ANKLE ARTHROSCOPY WITH RECONSTRUCTION: SHX5583

## 2023-10-21 SURGERY — ARTHROSCOPY, ANKLE, WITH RECONSTRUCTION
Anesthesia: Regional | Site: Ankle | Laterality: Left

## 2023-10-21 MED ORDER — PROPOFOL 10 MG/ML IV BOLUS
INTRAVENOUS | Status: AC
  Filled 2023-10-21: qty 20

## 2023-10-21 MED ORDER — DEXAMETHASONE SODIUM PHOSPHATE 10 MG/ML IJ SOLN
INTRAMUSCULAR | Status: AC
  Filled 2023-10-21: qty 1

## 2023-10-21 MED ORDER — ACETAMINOPHEN 500 MG PO TABS
ORAL_TABLET | ORAL | Status: AC
  Filled 2023-10-21: qty 2

## 2023-10-21 MED ORDER — DROPERIDOL 2.5 MG/ML IJ SOLN: 0.6250 mg | Freq: Once | INTRAMUSCULAR | Status: DC | PRN

## 2023-10-21 MED ORDER — SODIUM CHLORIDE 0.9 % IV SOLN
INTRAVENOUS | Status: AC | PRN
  Administered 2023-10-21: 100 mL via INTRAMUSCULAR

## 2023-10-21 MED ORDER — ONDANSETRON HCL 4 MG/2ML IJ SOLN
INTRAMUSCULAR | Status: AC
  Filled 2023-10-21: qty 2

## 2023-10-21 MED ORDER — FENTANYL CITRATE (PF) 100 MCG/2ML IJ SOLN
INTRAMUSCULAR | Status: AC
  Filled 2023-10-21: qty 2

## 2023-10-21 MED ORDER — EPHEDRINE 5 MG/ML INJ
INTRAVENOUS | Status: AC
  Filled 2023-10-21: qty 5

## 2023-10-21 MED ORDER — MIDAZOLAM HCL 2 MG/2ML IJ SOLN
1.0000 mg | Freq: Once | INTRAMUSCULAR | Status: AC
  Administered 2023-10-21: 1 mg via INTRAVENOUS

## 2023-10-21 MED ORDER — SODIUM CHLORIDE 0.9 % IR SOLN
Status: DC | PRN
  Administered 2023-10-21: 300 mL

## 2023-10-21 MED ORDER — CHLORHEXIDINE GLUCONATE 4 % EX SOLN: 60.0000 mL | Freq: Once | CUTANEOUS | Status: DC

## 2023-10-21 MED ORDER — LACTATED RINGERS IV SOLN
INTRAVENOUS | Status: DC
Start: 2023-10-21 — End: 2023-10-21

## 2023-10-21 MED ORDER — OXYCODONE HCL 5 MG PO TABS: 5.0000 mg | ORAL_TABLET | Freq: Once | ORAL | Status: DC | PRN

## 2023-10-21 MED ORDER — CEFAZOLIN SODIUM-DEXTROSE 2-4 GM/100ML-% IV SOLN
INTRAVENOUS | Status: AC
  Filled 2023-10-21: qty 100

## 2023-10-21 MED ORDER — LIDOCAINE 2% (20 MG/ML) 5 ML SYRINGE
INTRAMUSCULAR | Status: DC | PRN
  Administered 2023-10-21: 20 mg via INTRAVENOUS

## 2023-10-21 MED ORDER — EPHEDRINE SULFATE-NACL 50-0.9 MG/10ML-% IV SOSY
PREFILLED_SYRINGE | INTRAVENOUS | Status: DC | PRN
  Administered 2023-10-21: 15 mg via INTRAVENOUS
  Administered 2023-10-21 (×3): 10 mg via INTRAVENOUS

## 2023-10-21 MED ORDER — LIDOCAINE 2% (20 MG/ML) 5 ML SYRINGE
INTRAMUSCULAR | Status: AC
  Filled 2023-10-21: qty 5

## 2023-10-21 MED ORDER — PHENYLEPHRINE 80 MCG/ML (10ML) SYRINGE FOR IV PUSH (FOR BLOOD PRESSURE SUPPORT)
PREFILLED_SYRINGE | INTRAVENOUS | Status: AC
  Filled 2023-10-21: qty 10

## 2023-10-21 MED ORDER — VANCOMYCIN HCL 500 MG IV SOLR
INTRAVENOUS | Status: DC | PRN
  Administered 2023-10-21: 500 mg via TOPICAL

## 2023-10-21 MED ORDER — ONDANSETRON HCL 4 MG/2ML IJ SOLN
INTRAMUSCULAR | Status: DC | PRN
  Administered 2023-10-21: 4 mg via INTRAVENOUS

## 2023-10-21 MED ORDER — OXYCODONE HCL 5 MG/5ML PO SOLN: 5.0000 mg | Freq: Once | ORAL | Status: DC | PRN

## 2023-10-21 MED ORDER — DEXAMETHASONE SODIUM PHOSPHATE 10 MG/ML IJ SOLN
INTRAMUSCULAR | Status: DC | PRN
  Administered 2023-10-21: 10 mg via INTRAVENOUS

## 2023-10-21 MED ORDER — PHENYLEPHRINE 80 MCG/ML (10ML) SYRINGE FOR IV PUSH (FOR BLOOD PRESSURE SUPPORT)
PREFILLED_SYRINGE | INTRAVENOUS | Status: DC | PRN
  Administered 2023-10-21 (×2): 80 ug via INTRAVENOUS

## 2023-10-21 MED ORDER — FENTANYL CITRATE (PF) 100 MCG/2ML IJ SOLN
50.0000 ug | Freq: Once | INTRAMUSCULAR | Status: AC
  Administered 2023-10-21: 50 ug via INTRAVENOUS

## 2023-10-21 MED ORDER — CEFAZOLIN SODIUM-DEXTROSE 2-4 GM/100ML-% IV SOLN
2.0000 g | INTRAVENOUS | Status: AC
  Administered 2023-10-21: 2 g via INTRAVENOUS

## 2023-10-21 MED ORDER — SODIUM CHLORIDE 0.9 % IV SOLN: INTRAVENOUS | Status: DC | PRN

## 2023-10-21 MED ORDER — PROPOFOL 10 MG/ML IV BOLUS
INTRAVENOUS | Status: DC | PRN
  Administered 2023-10-21: 200 mg via INTRAVENOUS

## 2023-10-21 MED ORDER — MIDAZOLAM HCL 2 MG/2ML IJ SOLN
INTRAMUSCULAR | Status: AC
  Filled 2023-10-21: qty 2

## 2023-10-21 MED ORDER — FENTANYL CITRATE (PF) 100 MCG/2ML IJ SOLN: 25.0000 ug | INTRAMUSCULAR | Status: DC | PRN

## 2023-10-21 MED ORDER — BUPIVACAINE HCL (PF) 0.25 % IJ SOLN
INTRAMUSCULAR | Status: DC | PRN
  Administered 2023-10-21: 20 mL via EPIDURAL
  Administered 2023-10-21: 25 mL via EPIDURAL

## 2023-10-21 MED ORDER — ACETAMINOPHEN 10 MG/ML IV SOLN
1000.0000 mg | Freq: Once | INTRAVENOUS | Status: DC | PRN
Start: 2023-10-21 — End: 2023-10-21

## 2023-10-21 MED ORDER — ACETAMINOPHEN 500 MG PO TABS
1000.0000 mg | ORAL_TABLET | Freq: Once | ORAL | Status: AC
  Administered 2023-10-21: 1000 mg via ORAL

## 2023-10-21 SURGICAL SUPPLY — 63 items
ANCHOR SUT FBRTK 1.3 SUTTAP (Anchor) IMPLANT
ANCHOR SUT FIBERTAK DX DBL (Anchor) IMPLANT
BANDAGE ESMARK 6X9 LF (GAUZE/BANDAGES/DRESSINGS) IMPLANT
BLADE SURG 15 STRL LF DISP TIS (BLADE) ×2 IMPLANT
BNDG ELASTIC 4INX 5YD STR LF (GAUZE/BANDAGES/DRESSINGS) ×2 IMPLANT
BNDG ELASTIC 6X10 VLCR STRL LF (GAUZE/BANDAGES/DRESSINGS) ×2 IMPLANT
BNDG ESMARK 6X9 LF (GAUZE/BANDAGES/DRESSINGS)
BNDG GAUZE DERMACEA FLUFF 4 (GAUZE/BANDAGES/DRESSINGS) ×2 IMPLANT
BOOT STEPPER DURA LG (SOFTGOODS) IMPLANT
BOOT STEPPER DURA MED (SOFTGOODS) IMPLANT
BOOT STEPPER DURA SM (SOFTGOODS) IMPLANT
BURR OVAL 8 FLU 4.0X13 (MISCELLANEOUS) IMPLANT
CHLORAPREP W/TINT 26 (MISCELLANEOUS) IMPLANT
CUFF TRNQT CYL 34X4.125X (TOURNIQUET CUFF) IMPLANT
DISSECTOR 3.8MM X 13CM (MISCELLANEOUS) IMPLANT
DRAPE EXTREMITY T 121X128X90 (DISPOSABLE) ×2 IMPLANT
DRAPE OEC MINIVIEW 54X84 (DRAPES) IMPLANT
DRAPE U-SHAPE 47X51 STRL (DRAPES) ×2 IMPLANT
DRSG MEPITEL 4X7.2 (GAUZE/BANDAGES/DRESSINGS) ×2 IMPLANT
ELECT REM PT RETURN 9FT ADLT (ELECTROSURGICAL) ×2
ELECTRODE REM PT RTRN 9FT ADLT (ELECTROSURGICAL) ×2 IMPLANT
EXCALIBUR 3.8MM X 13CM (MISCELLANEOUS) IMPLANT
GAUZE PAD ABD 8X10 STRL (GAUZE/BANDAGES/DRESSINGS) ×4 IMPLANT
GAUZE SPONGE 4X4 12PLY STRL (GAUZE/BANDAGES/DRESSINGS) ×2 IMPLANT
GAUZE XEROFORM 1X8 LF (GAUZE/BANDAGES/DRESSINGS) ×2 IMPLANT
GLOVE BIOGEL PI IND STRL 8 (GLOVE) ×2 IMPLANT
GLOVE SURG SS PI 7.5 STRL IVOR (GLOVE) ×2 IMPLANT
GOWN STRL REUS W/ TWL LRG LVL3 (GOWN DISPOSABLE) ×2 IMPLANT
GOWN STRL REUS W/ TWL XL LVL3 (GOWN DISPOSABLE) ×4 IMPLANT
IV NS IRRIG 3000ML ARTHROMATIC (IV SOLUTION) ×2 IMPLANT
KIT FIBERTAK DX 1.6 DISP (KITS) IMPLANT
MANIFOLD NEPTUNE II (INSTRUMENTS) ×2 IMPLANT
NANONEEDLE HIGHFLOW SHEATH 125 (SHEATH) ×2
NDL SUT 6 .5 CRC .975X.05 MAYO (NEEDLE) IMPLANT
NS IRRIG 1000ML POUR BTL (IV SOLUTION) IMPLANT
PACK ARTHROSCOPY DSU (CUSTOM PROCEDURE TRAY) ×2 IMPLANT
PACK BASIN DAY SURGERY FS (CUSTOM PROCEDURE TRAY) ×2 IMPLANT
PAD CAST 4YDX4 CTTN HI CHSV (CAST SUPPLIES) ×2 IMPLANT
PADDING CAST COTTON 6X4 STRL (CAST SUPPLIES) IMPLANT
PENCIL SMOKE EVACUATOR (MISCELLANEOUS) IMPLANT
SANITIZER HAND PURELL FF 515ML (MISCELLANEOUS) ×2 IMPLANT
SCOPE NANONDL 125 (MISCELLANEOUS) IMPLANT
SCOPE NANONEEDLE 125 (MISCELLANEOUS) ×2 IMPLANT
SET IRRIG Y TYPE TUR BLADDER L (SET/KITS/TRAYS/PACK) ×2 IMPLANT
SHAVER DISSECTOR 3.0 (BURR) IMPLANT
SHAVER SABRE 2.0 (BURR) IMPLANT
SHEATH NANONDL HIGHFLOW 125 (SHEATH) IMPLANT
SHEATH NANONEEDLE HIGHFLOW 125 (SHEATH) ×2 IMPLANT
SLEEVE SCD COMPRESS KNEE MED (STOCKING) ×2 IMPLANT
SPONGE T-LAP 18X18 ~~LOC~~+RFID (SPONGE) ×2 IMPLANT
STOCKINETTE 6 STRL (DRAPES) ×2 IMPLANT
STRAP ANKLE FOOT DISTRACTOR (ORTHOPEDIC SUPPLIES) IMPLANT
SUCTION TUBE FRAZIER 10FR DISP (SUCTIONS) ×2 IMPLANT
SUT ETHILON 2 0 FS 18 (SUTURE) ×2 IMPLANT
SUT VIC AB 0 CT1 27XBRD ANBCTR (SUTURE) IMPLANT
SUT VIC AB 2-0 CT1 TAPERPNT 27 (SUTURE) IMPLANT
SUT VIC AB 3-0 SH 27X BRD (SUTURE) IMPLANT
SYR BULB EAR ULCER 3OZ GRN STR (SYRINGE) ×2 IMPLANT
TOWEL GREEN STERILE FF (TOWEL DISPOSABLE) ×2 IMPLANT
TUBE CONNECTING 20X1/4 (TUBING) IMPLANT
TUBING ARTHROSCOPY IRRIG 16FT (MISCELLANEOUS) ×2 IMPLANT
WAND ABLATOR APOLLO I90 (BUR) IMPLANT
WATER STERILE IRR 1000ML POUR (IV SOLUTION) ×2 IMPLANT

## 2023-10-21 NOTE — Anesthesia Procedure Notes (Signed)
Anesthesia Regional Block: Adductor canal block   Pre-Anesthetic Checklist: , timeout performed,  Correct Patient, Correct Site, Correct Laterality,  Correct Procedure, Correct Position, site marked,  Risks and benefits discussed,  Surgical consent,  Pre-op evaluation,  At surgeon's request and post-op pain management  Laterality: Left  Prep: chloraprep       Needles:  Injection technique: Single-shot  Needle Type: Echogenic Stimulator Needle     Needle Length: 9cm  Needle Gauge: 21     Additional Needles:   Procedures:,,,, ultrasound used (permanent image in chart),,    Narrative:  Start time: 10/21/2023 9:55 AM End time: 10/21/2023 9:58 AM Injection made incrementally with aspirations every 5 mL.  Performed by: Personally  Anesthesiologist: [place] Nation, MD  Additional Notes: Discussed risks and benefits of the nerve block in detail, including but not limited vascular injury, permanent nerve damage and infection.   Patient tolerated the procedure well. Local anesthetic introduced in an incremental fashion under minimal resistance after negative aspirations. No paresthesias were elicited. After completion of the procedure, no acute issues were identified and patient continued to be monitored by RN.

## 2023-10-21 NOTE — Anesthesia Preprocedure Evaluation (Addendum)
Anesthesia Evaluation  Patient identified by MRN, date of birth, ID band Patient awake    Reviewed: Allergy & Precautions, H&P , NPO status , Patient's Chart, lab work & pertinent test results  Airway Mallampati: II  TM Distance: >3 FB Neck ROM: Full    Dental no notable dental hx.    Pulmonary COPD, former smoker   Pulmonary exam normal breath sounds clear to auscultation       Cardiovascular hypertension, Normal cardiovascular exam Rhythm:Regular Rate:Normal     Neuro/Psych   Anxiety     negative neurological ROS  negative psych ROS   GI/Hepatic Neg liver ROS,GERD  ,,  Endo/Other  negative endocrine ROS    Renal/GU Renal disease  negative genitourinary   Musculoskeletal  (+) Arthritis ,    Abdominal   Peds negative pediatric ROS (+)  Hematology negative hematology ROS (+)   Anesthesia Other Findings   Reproductive/Obstetrics negative OB ROS                             Anesthesia Physical Anesthesia Plan  ASA: 2  Anesthesia Plan: General and Regional   Post-op Pain Management: Regional block*   Induction: Intravenous  PONV Risk Score and Plan: 2 and Ondansetron, Dexamethasone and Treatment may vary due to age or medical condition  Airway Management Planned: LMA  Additional Equipment:   Intra-op Plan:   Post-operative Plan: Extubation in OR  Informed Consent: I have reviewed the patients History and Physical, chart, labs and discussed the procedure including the risks, benefits and alternatives for the proposed anesthesia with the patient or authorized representative who has indicated his/her understanding and acceptance.     Dental advisory given  Plan Discussed with: CRNA  Anesthesia Plan Comments:         Anesthesia Quick Evaluation

## 2023-10-21 NOTE — H&P (Addendum)
H&P Update:  -History and Physical Reviewed  -Patient has been re-examined  -No change in the plan of care  -The risks and benefits were presented and reviewed. The risks due to arthroscopy, hardware/suture failure and/or irritation, new/persistent infection, stiffness, nerve/vessel/tendon injury or rerupture of repaired tendon, nonunion/malunion, allograft usage, wound healing issues, development of arthritis, failure of this surgery, possibility of external fixation with delayed definitive surgery, need for further surgery, thromboembolic events, anesthesia/medical complications, amputation, death among others were discussed. The patient acknowledged the explanation, agreed to proceed with the plan and a consent was signed.  Garo Heidelberg  

## 2023-10-21 NOTE — Discharge Instructions (Addendum)
Netta Cedars, MD [facility]  Please read the following information regarding your care after surgery.  Medications  You only need a prescription for the narcotic pain medicine (ex. oxycodone, Percocet, Norco).  All of the other medicines listed below are available over the counter. ? Aleve 2 pills twice a day for the first 3 days after surgery. ? acetominophen (Tylenol) 650 mg every 4-6 hours as you need for minor to moderate pain ? oxycodone as prescribed for severe pain  ? To help prevent blood clots, take aspirin (81 mg) twice daily for 42 days after surgery (or total duration of nonweightbearing).  You should also get up every hour while you are awake to move around.  Weight Bearing ? Do NOT bear any weight on the operated leg or foot. This means do NOT touch your surgical leg to the ground!  Cast / Splint / Dressing ? If you have a splint, do NOT remove this. Keep your splint, cast or dressing clean and dry.  Don't put anything (coat hanger, pencil, etc) down inside of it.  If it gets wet, call the office immediately to schedule an appointment for a cast change.  Swelling IMPORTANT: It is normal for you to have swelling where you had surgery. To reduce swelling and pain, keep at least 3 pillows under your leg so that your toes are above your nose and your heel is above the level of your hip.  It may be necessary to keep your foot or leg elevated for several weeks.  This is critical to helping your incisions heal and your pain to feel better.  Follow Up Call my office at [phone] when you are discharged from the hospital or surgery center to schedule an appointment to be seen 7-10 days after surgery.  Call my office at [phone] if you develop a fever >101.5 F, nausea, vomiting, bleeding from the surgical site or severe pain.    Regional Anesthesia Blocks  1. You may not be able to move or feel the "blocked" extremity after a regional anesthetic block. This may last may  last from 3-48 hours after placement, but it will go away. The length of time depends on the medication injected and your individual response to the medication. As the nerves start to wake up, you may experience tingling as the movement and feeling returns to your extremity. If the numbness and inability to move your extremity has not gone away after 48 hours, please call your surgeon.   2. The extremity that is blocked will need to be protected until the numbness is gone and the strength has returned. Because you cannot feel it, you will need to take extra care to avoid injury. Because it may be weak, you may have difficulty moving it or using it. You may not know what position it is in without looking at it while the block is in effect.  3. For blocks in the legs and feet, returning to weight bearing and walking needs to be done carefully. You will need to wait until the numbness is entirely gone and the strength has returned. You should be able to move your leg and foot normally before you try and bear weight or walk. You will need someone to be with you when you first try to ensure you do not fall and possibly risk injury.  4. Bruising and tenderness at the needle site are common side effects and will resolve in a few days.  5. Persistent numbness or new problems  with movement should be communicated to the surgeon or the [facility] [phone] [facility] [phone]).    Post Anesthesia Home Care Instructions  Activity: Get plenty of rest for the remainder of the day. A responsible individual must stay with you for 24 hours following the procedure.  For the next 24 hours, DO NOT: -Drive a car -Advertising copywriter -Drink alcoholic beverages -Take any medication unless instructed by your physician -Make any legal decisions or sign important papers.  Meals: Start with liquid foods such as gelatin or soup. Progress to regular foods as tolerated. Avoid greasy,  spicy, heavy foods. If nausea and/or vomiting occur, drink only clear liquids until the nausea and/or vomiting subsides. Call your physician if vomiting continues.  Special Instructions/Symptoms: Your throat may feel dry or sore from the anesthesia or the breathing tube placed in your throat during surgery. If this causes discomfort, gargle with warm salt water. The discomfort should disappear within 24 hours.

## 2023-10-21 NOTE — H&P (Signed)
ORTHOPAEDIC SURGERY H&P  Subjective:  The patient presents with left ankle instability.   Past Medical History:  Diagnosis Date   Abnormal liver function test    Anxiety    Colon polyp    DDD (degenerative disc disease)    ED (erectile dysfunction) 04/05/2014   FHx: heart disease 01/28/2018   GERD (gastroesophageal reflux disease)    OTC   Hyperlipidemia    Hypertension    Insomnia     Past Surgical History:  Procedure Laterality Date   ADENOIDECTOMY     age 73   APPENDECTOMY     DENTAL SURGERY     Dental implants   LAPAROSCOPIC CHOLECYSTECTOMY  05/2020   At surgery center, Dr. Judene Companion SURGERY  Aug 2016   Dr. Gypsy Lore     VASECTOMY       (Not in an outpatient encounter)    No Known Allergies  Social History   Socioeconomic History   Marital status: Married    Spouse name: Not on file   Number of children: 2   Years of education: Not on file   Highest education level: Not on file  Occupational History   Not on file  Tobacco Use   Smoking status: Former    Current packs/day: 0.00    Average packs/day: 1 pack/day for 45.0 years (45.0 ttl pk-yrs)    Types: Cigarettes    Start date: 05/09/1975    Quit date: 05/08/2020    Years since quitting: 3.4   Smokeless tobacco: Never   Tobacco comments:    quit using chantix   Substance and Sexual Activity   Alcohol use: Yes    Alcohol/week: 20.0 standard drinks of alcohol    Types: 20 Cans of beer per week    Comment: occ beer   Drug use: No   Sexual activity: Yes  Other Topics Concern   Not on file  Social History Narrative   One grandchild.  Works on farm Banker.     Social Drivers of Corporate investment banker Strain: Not on file  Food Insecurity: Not on file  Transportation Needs: Not on file  Physical Activity: Not on file  Stress: Not on file  Social Connections: Not on file  Intimate Partner Violence: Not on file     History reviewed. No pertinent family  history.   Review of Systems Pertinent items are noted in HPI.  Objective: Vital signs in last 24 hours:    10/14/2023    3:06 PM 10/08/2023   10:47 AM 07/06/2023    9:06 AM  Vitals with BMI  Height 6\' 2"  6\' 2"  6\' 2"   Weight 221 lbs 9 oz 221 lbs 10 oz 218 lbs  BMI 28.43 28.44 27.98  Systolic  138 146  Diastolic  70 76  Pulse  71 76      EXAM: General: Well nourished, well developed. Awake, alert and oriented to time, place, person. Normal mood and affect. No apparent distress. Breathing room air.  Operative Lower Extremity: Alignment - Neutral Deformity - None Skin intact Tenderness to palpation - LLC, peroneal tendons 5/5 TA, PT, GS, Per, EHL, FHL Sensation intact to light touch throughout Palpable DP and PT pulses Special testing: None  The contralateral foot/ankle was examined for comparison and noted to be neurovascularly intact with no localized deformity, swelling, or tenderness.  Imaging Review All images taken were independently reviewed by me.  Assessment/Plan: The clinical and radiographic findings  were reviewed and discussed at length with the patient.  The patient has left lateral ankle instability.  We spoke at length about the natural course of these findings. We discussed nonoperative and operative treatment options in detail.  The risks and benefits were presented and reviewed. The risks due to arthroscopy, hardware failure/irritation, new/persistent/recurrent infection, stiffness, nerve/vessel/tendon injury, nonunion/malunion of any fracture, wound healing issues, allograft usage, development of arthritis, failure of this surgery, possibility of external fixation in certain situations, possibility of delayed definitive surgery, need for further surgery, prolonged wound care including further soft tissue coverage procedures, thromboembolic events, anesthesia/medical complications/events perioperatively and beyond, amputation, death among others were  discussed. The patient acknowledged the explanation and agreed to proceed with the plan.  Netta Cedars  Orthopaedic Surgery [facility]

## 2023-10-21 NOTE — Anesthesia Procedure Notes (Signed)
Procedure Name: LMA Insertion Date/Time: 10/21/2023 11:05 AM  Performed by: Roosvelt Harps, CRNAPre-anesthesia Checklist: Patient identified, Emergency Drugs available, Suction available and Patient being monitored Patient Re-evaluated:Patient Re-evaluated prior to induction Oxygen Delivery Method: Circle System Utilized Preoxygenation: Pre-oxygenation with 100% oxygen Induction Type: IV induction Ventilation: Mask ventilation without difficulty LMA: LMA inserted LMA Size: 5.0 Number of attempts: 1 Airway Equipment and Method: Bite block Placement Confirmation: positive ETCO2 and breath sounds checked- equal and bilateral Tube secured with: Tape Dental Injury: Teeth and Oropharynx as per pre-operative assessment

## 2023-10-21 NOTE — Transfer of Care (Signed)
Immediate Anesthesia Transfer of Care Note  Patient: Arthur Garcia  Procedure(s) Performed: LEFT ANKLE ARTHROSCOPY WITH OPEN STABILIZATION OF LATERAL ANKLE LIGAMENT COMPLEX(COMPLEX) (Left: Ankle) debridement OF PERONEUS BREVIS TENDON (Left)  Patient Location: PACU  Anesthesia Type:General and Regional  Level of Consciousness: awake  Airway & Oxygen Therapy: Patient Spontanous Breathing and Patient connected to face mask oxygen  Post-op Assessment: Report given to RN and Post -op Vital signs reviewed and stable  Post vital signs: Reviewed and stable  Last Vitals:  Vitals Value Taken Time  BP    Temp    Pulse 79 10/21/23 1205  Resp 15 10/21/23 1205  SpO2 97 % 10/21/23 1205  Vitals shown include unfiled device data.  Last Pain:  Vitals:   10/21/23 0922  TempSrc: Temporal  PainSc: 2       Patients Stated Pain Goal: 4 (10/21/23 1610)  Complications: No notable events documented.

## 2023-10-21 NOTE — Op Note (Addendum)
10/21/2023  10:47 AM   PATIENT: Arthur Garcia  73 y.o. male  MRN: [id number]   PRE-OPERATIVE DIAGNOSIS:   Left lateral ankle instability and peroneal tendinopathy   POST-OPERATIVE DIAGNOSIS:   Same   PROCEDURE: 1] Left ankle arthroscopy assisted debridement 2] Left ankle open lateral ligament stabilization Toniann Ket) 3] Left peroneal tendons debridement with tenosynovectomy   SURGEON:  Netta Cedars, MD   ASSISTANT: None   ANESTHESIA: General, regional   EBL: Minimal   TOURNIQUET:   * Missing tourniquet times found for documented tourniquets in log: [id number] *   COMPLICATIONS: None apparent   DISPOSITION: Extubated, awake and stable to recovery.   INDICATION FOR PROCEDURE: The patient presented with above diagnosis.  We discussed the diagnosis, alternative treatment options, risks and benefits of the above surgical intervention, as well as alternative non-operative treatments. All questions/concerns were addressed and the patient/family demonstrated appropriate understanding of the diagnosis, the procedure, the postoperative course, and overall prognosis. The patient wished to proceed with surgical intervention and signed an informed surgical consent as such, in each others presence prior to surgery.   PROCEDURE IN DETAIL: After preoperative consent was obtained and the correct operative site was identified, the patient was brought to the operating room supine on stretcher and transferred onto operating table. General anesthesia was induced. Preoperative antibiotics were administered. Surgical timeout was taken. The patient was then positioned supine with an ipsilateral hip bump. The operative lower extremity was prepped and draped in standard sterile fashion with a tourniquet around the thigh. The extremity was exsanguinated and the tourniquet was inflated to 275 mmHg.  Routine evaluation of the ankle joint demonstrated gross lateral laxity with drawer  testing. Full dorsiflexion as well as plantarflexion was possible.   We began by insufflating the ankle joint thru anteromedial approach. The anteromedial portal was carefully established medial to the tibialis anterior tendon. The arthroscopic trochar with blunt was inserted and then camera placed. There was excellent visualization of the joint and routine diagnostic ankle arthroscopy was performed. Of note, there was mild synovitis throughout the joint noted. Mild chondral changes in the tibiotalar joint surfaces. No loose bodies were encountered and no anterior ankle impingement was identified on max dorsiflexion. The deltoid and syndesmosis ligaments were stressed and noted to be stable. Arthroscopic assisted debridement of the ankle joint was performed.    A standard curvilinear approach was made over the lateral ankle ligament complex and the distal lateral malleolus.    We began by windowing the approach posteriorly to access the peroneal tendons. The sheath was incised and tenosynovial fluid was readily evacuated. We then sequentially evaluated both the peroneus longus and brevis tendons with no tearing noted. We did note extensive tenosynovitis that was debrided thoroughly. There was no instability of peroneal tendons to intraoperative stress testing. The peroneal tendon sheath was closed with vicryl.    Dissection was the carried down to the level of the lateral ankle ligament complex. We sharply incised the capsule and the complex just distal to the tip of the fibula leaving a small cuff of tissue for repair after advancement. The proximal flap was elevated carefully off the lateral malleolus. We then used a rongeur to roughen the distal lateral malleolus. Two Arthrex FiberTak anchors were implanted using standard technique in the anatomic footprints of the ATFL and CFL ligaments. These were verified by manual stress to be well seated within bone. The suture needles were sequentially passed  through the distal flap, tied, and then brought back  proximally into the proximal flap were they were then tied.The foot was held in eversion throughout to set appropriate tension and protect the repair. Intraoperative ankle testing demonstrated improved stability of the newly reconstructed lateral ankle ligament complex.   The surgical sites were thoroughly irrigated. The tourniquet was deflated and hemostasis achieved. Betadine and vancomycin powder were applied. The deep layers were closed using 2-0 vicryl. The skin was closed without tension using 2-0 nylon suture.    The leg was cleaned with saline and sterile dressings with gauze were applied. A well padded bulky short leg splint was applied. The patient was awakened from anesthesia and transported to the recovery room in stable condition.    FOLLOW UP PLAN: -transfer to PACU, then home -strict NWB operative extremity, maximum elevation -maintain short leg splint until follow up -DVT ppx: Aspirin 81 mg twice daily while NWB -follow up as outpatient within 7-10 days for wound check with exchange of short leg splint to short leg cast -sutures out in 2-3 weeks in outpatient office   RADIOGRAPHS: None used   Netta Cedars [facility]opa[facility]

## 2023-10-21 NOTE — Progress Notes (Signed)
Assisted Dr. Lyn Henri with left, adductor canal, popliteal, ultrasound guided block. Side rails up, monitors on throughout procedure. See vital signs in flow sheet. Tolerated Procedure well.

## 2023-10-21 NOTE — Anesthesia Procedure Notes (Signed)
Anesthesia Regional Block: Popliteal block   Pre-Anesthetic Checklist: , timeout performed,  Correct Patient, Correct Site, Correct Laterality,  Correct Procedure, Correct Position, site marked,  Risks and benefits discussed,  Surgical consent,  Pre-op evaluation,  At surgeon's request and post-op pain management  Laterality: Left  Prep: chloraprep       Needles:  Injection technique: Single-shot  Needle Type: Echogenic Stimulator Needle     Needle Length: 9cm  Needle Gauge: 21     Additional Needles:   Procedures:,,,, ultrasound used (permanent image in chart),,    Narrative:  Start time: 10/21/2023 9:50 AM End time: 10/21/2023 9:55 AM Injection made incrementally with aspirations every 5 mL.  Performed by: Personally  Anesthesiologist: [place], MD  Additional Notes: Discussed risks and benefits of the nerve block in detail, including but not limited vascular injury, permanent nerve damage and infection.   Patient tolerated the procedure well. Local anesthetic introduced in an incremental fashion under minimal resistance after negative aspirations. No paresthesias were elicited. After completion of the procedure, no acute issues were identified and patient continued to be monitored by RN.

## 2023-10-22 ENCOUNTER — Encounter (HOSPITAL_BASED_OUTPATIENT_CLINIC_OR_DEPARTMENT_OTHER): Payer: Self-pay | Admitting: Orthopaedic Surgery

## 2023-10-22 NOTE — Anesthesia Postprocedure Evaluation (Signed)
Anesthesia Post Note  Patient: Arthur Garcia  Procedure(s) Performed: LEFT ANKLE ARTHROSCOPY WITH OPEN STABILIZATION OF LATERAL ANKLE LIGAMENT COMPLEX(COMPLEX) (Left: Ankle) debridement OF PERONEUS BREVIS TENDON (Left)     Patient location during evaluation: PACU Anesthesia Type: Regional and General Level of consciousness: awake and alert Pain management: pain level controlled Vital Signs Assessment: post-procedure vital signs reviewed and stable Respiratory status: spontaneous breathing, nonlabored ventilation, respiratory function stable and patient connected to nasal cannula oxygen Cardiovascular status: blood pressure returned to baseline and stable Postop Assessment: no apparent nausea or vomiting Anesthetic complications: no   No notable events documented.  Last Vitals:  Vitals:   10/21/23 1215 10/21/23 1230  BP: (!) 159/88 (!) 160/85  Pulse: 79 77  Resp: 13 18  Temp:  (!) 36.2 C  SpO2: 95% 94%    Last Pain:  Vitals:   10/21/23 0922  TempSrc: Temporal                 [employer]

## 2023-12-04 DIAGNOSIS — M25572 Pain in left ankle and joints of left foot: Secondary | ICD-10-CM | POA: Diagnosis not present

## 2023-12-04 DIAGNOSIS — M25672 Stiffness of left ankle, not elsewhere classified: Secondary | ICD-10-CM | POA: Diagnosis not present

## 2023-12-08 DIAGNOSIS — M25672 Stiffness of left ankle, not elsewhere classified: Secondary | ICD-10-CM | POA: Diagnosis not present

## 2023-12-08 DIAGNOSIS — M25572 Pain in left ankle and joints of left foot: Secondary | ICD-10-CM | POA: Diagnosis not present

## 2023-12-11 DIAGNOSIS — M25672 Stiffness of left ankle, not elsewhere classified: Secondary | ICD-10-CM | POA: Diagnosis not present

## 2023-12-11 DIAGNOSIS — M25572 Pain in left ankle and joints of left foot: Secondary | ICD-10-CM | POA: Diagnosis not present

## 2023-12-14 DIAGNOSIS — M25572 Pain in left ankle and joints of left foot: Secondary | ICD-10-CM | POA: Diagnosis not present

## 2023-12-14 DIAGNOSIS — M25672 Stiffness of left ankle, not elsewhere classified: Secondary | ICD-10-CM | POA: Diagnosis not present

## 2023-12-17 DIAGNOSIS — M25672 Stiffness of left ankle, not elsewhere classified: Secondary | ICD-10-CM | POA: Diagnosis not present

## 2023-12-17 DIAGNOSIS — M25572 Pain in left ankle and joints of left foot: Secondary | ICD-10-CM | POA: Diagnosis not present

## 2023-12-21 DIAGNOSIS — M25572 Pain in left ankle and joints of left foot: Secondary | ICD-10-CM | POA: Diagnosis not present

## 2023-12-21 DIAGNOSIS — M25672 Stiffness of left ankle, not elsewhere classified: Secondary | ICD-10-CM | POA: Diagnosis not present

## 2023-12-22 ENCOUNTER — Encounter: Payer: Medicare Other | Admitting: Internal Medicine

## 2023-12-24 DIAGNOSIS — M25672 Stiffness of left ankle, not elsewhere classified: Secondary | ICD-10-CM | POA: Diagnosis not present

## 2023-12-24 DIAGNOSIS — M25572 Pain in left ankle and joints of left foot: Secondary | ICD-10-CM | POA: Diagnosis not present

## 2023-12-28 DIAGNOSIS — M25572 Pain in left ankle and joints of left foot: Secondary | ICD-10-CM | POA: Diagnosis not present

## 2024-01-11 DIAGNOSIS — M25672 Stiffness of left ankle, not elsewhere classified: Secondary | ICD-10-CM | POA: Diagnosis not present

## 2024-01-11 DIAGNOSIS — M25572 Pain in left ankle and joints of left foot: Secondary | ICD-10-CM | POA: Diagnosis not present

## 2024-01-18 DIAGNOSIS — M25672 Stiffness of left ankle, not elsewhere classified: Secondary | ICD-10-CM | POA: Diagnosis not present

## 2024-01-19 ENCOUNTER — Encounter: Payer: Medicare Other | Admitting: Internal Medicine

## 2024-01-21 ENCOUNTER — Encounter: Payer: Self-pay | Admitting: Internal Medicine

## 2024-01-21 ENCOUNTER — Ambulatory Visit (INDEPENDENT_AMBULATORY_CARE_PROVIDER_SITE_OTHER): Payer: Medicare Other | Admitting: Internal Medicine

## 2024-01-21 VITALS — BP 148/80 | HR 80 | Temp 97.5°F | Ht 74.0 in | Wt 206.6 lb

## 2024-01-21 DIAGNOSIS — F5101 Primary insomnia: Secondary | ICD-10-CM

## 2024-01-21 DIAGNOSIS — M5442 Lumbago with sciatica, left side: Secondary | ICD-10-CM

## 2024-01-21 DIAGNOSIS — Z79899 Other long term (current) drug therapy: Secondary | ICD-10-CM | POA: Diagnosis not present

## 2024-01-21 DIAGNOSIS — I10 Essential (primary) hypertension: Secondary | ICD-10-CM | POA: Diagnosis not present

## 2024-01-21 DIAGNOSIS — E782 Mixed hyperlipidemia: Secondary | ICD-10-CM

## 2024-01-21 MED ORDER — PREDNISONE 10 MG (21) PO TBPK: ORAL_TABLET | ORAL | 0 refills | Status: DC

## 2024-01-21 MED ORDER — KETOROLAC TROMETHAMINE 60 MG/2ML IM SOLN: 60.0000 mg | Freq: Once | INTRAMUSCULAR | Status: DC

## 2024-01-21 MED ORDER — ATORVASTATIN CALCIUM 20 MG PO TABS: ORAL_TABLET | ORAL | 1 refills | Status: DC

## 2024-01-21 MED ORDER — TRAZODONE HCL 100 MG PO TABS: ORAL_TABLET | ORAL | 3 refills | Status: AC

## 2024-01-21 MED ORDER — LOSARTAN POTASSIUM 50 MG PO TABS: ORAL_TABLET | ORAL | 3 refills | Status: AC

## 2024-01-21 MED ORDER — KETOROLAC TROMETHAMINE 60 MG/2ML IM SOLN
60.0000 mg | Freq: Once | INTRAMUSCULAR | Status: AC
  Administered 2024-01-21: 60 mg via INTRAMUSCULAR

## 2024-01-21 NOTE — Progress Notes (Signed)
 New Patient Office Visit     CC/Reason for Visit: Establish care, discuss chronic and acute medical conditions, medication refills Previous PCP: Dr. Vangie Genet Last Visit: January 2025  HPI: Arthur Garcia is a 73 y.o. male who is coming in today for the above mentioned reasons. Past Medical History is significant for: Hypertension, hyperlipidemia, vitamin D  deficiency, impaired glucose tolerance, aortic atherosclerosis.  He is requesting medication refills of chronic medications today.  Blood pressure is noted to be elevated, he has not been doing ambulatory blood pressure measurements.  He recently had a left ankle surgery and just completed physical therapy for that and will be seeing his surgeon next week  Over the last 2 days he has developed significant sciatica with left buttock and posterior lateral thigh pain.   Past Medical/Surgical History: Past Medical History:  Diagnosis Date   Abnormal liver function test    Anxiety    Colon polyp    DDD (degenerative disc disease)    ED (erectile dysfunction) 04/05/2014   FHx: heart disease 01/28/2018   GERD (gastroesophageal reflux disease)    OTC   Hyperlipidemia    Hypertension    Insomnia     Past Surgical History:  Procedure Laterality Date   ADENOIDECTOMY     age 2   ANKLE ARTHROSCOPY WITH RECONSTRUCTION Left 10/21/2023   Procedure: LEFT ANKLE ARTHROSCOPY WITH OPEN STABILIZATION OF LATERAL ANKLE LIGAMENT COMPLEX(COMPLEX);  Surgeon: Ali Ink, MD;  Location: [facility];  Service: Orthopedics;  Laterality: Left;   APPENDECTOMY     DENTAL SURGERY     Dental implants   LAPAROSCOPIC CHOLECYSTECTOMY  05/2020   At surgery center, Dr. Dorrie Gaudier   REPAIR OF PERONEUS BREVIS TENDON Left 10/21/2023   Procedure: debridement OF PERONEUS BREVIS TENDON;  Surgeon: Ali Ink, MD;  Location:  SURGERY CENTER;  Service: Orthopedics;  Laterality: Left;   SPINE SURGERY  Aug 2016   Dr. Gaylia Kayser     VASECTOMY      Social History:  reports that he quit smoking about 3 years ago. His smoking use included cigarettes. He started smoking about 48 years ago. He has a 45 pack-year smoking history. He has never used smokeless tobacco. He reports current alcohol use of about 20.0 standard drinks of alcohol per week. He reports that he does not use drugs.  Allergies: No Known Allergies  Family History:  Family History  Problem Relation Age of Onset   Thyroid  disease Mother    Hypertension Mother    Osteoporosis Mother    Glaucoma Mother    Thyroid  disease Father    Hypertension Father    Hyperlipidemia Father    Heart disease Father        CHF   Heart disease Paternal Grandfather 42       Long standing "heart trouble"     Current Outpatient Medications:    Cholecalciferol (VITAMIN D -3) 5000 UNITS TABS, Take by mouth daily. Alternates between 5000 and 10272, Disp: , Rfl:    MELOXICAM  PO, Take by mouth. Take 1/2 tablet twice daily, Disp: , Rfl:    predniSONE  (STERAPRED UNI-PAK 21 TAB) 10 MG (21) TBPK tablet, Take as directed, Disp: 21 tablet, Rfl: 0   tadalafil  (CIALIS ) 5 MG tablet, Take 1 tablet  Daily  for Prostate, Disp: 90 tablet, Rfl: 3   vitamin B-12 (CYANOCOBALAMIN ) 100 MCG tablet, Take 100 mcg by mouth daily., Disp: , Rfl:    atorvastatin  (LIPITOR)  20 MG tablet, TAKE 1 TABLET BY MOUTH EVERY DAY FOR CHOLESTEROL, Disp: 90 tablet, Rfl: 1   losartan  (COZAAR ) 50 MG tablet, TAKE 1 TABLET DAILY FOR BLOOD PRESSURE, Disp: 90 tablet, Rfl: 3   traZODone  (DESYREL ) 100 MG tablet, Take  1 tablet 1-2 hours  before Bedtime  as needed for Sleep, Disp: 90 tablet, Rfl: 3  Current Facility-Administered Medications:    ketorolac  (TORADOL ) injection 60 mg, 60 mg, Intramuscular, Once,   Review of Systems:  Negative except as indicated in HPI.   Physical Exam: Vitals:   01/21/24 1403  BP: (!) 148/80  Pulse: 80  Temp: (!) 97.5 F (36.4 C)  TempSrc: Oral  SpO2: 98%   Weight: 206 lb 9.6 oz (93.7 kg)  Height: 6\' 2"  (1.88 m)   Body mass index is 26.53 kg/m.  Physical Exam    Impression and Plan:  Acute left-sided low back pain with left-sided sciatica -     Ketorolac  Tromethamine  -     predniSONE ; Take as directed  Dispense: 21 tablet; Refill: 0  Essential hypertension -     Losartan  Potassium; TAKE 1 TABLET DAILY FOR BLOOD PRESSURE  Dispense: 90 tablet; Refill: 3  Medication management -     Losartan  Potassium; TAKE 1 TABLET DAILY FOR BLOOD PRESSURE  Dispense: 90 tablet; Refill: 3  Primary insomnia -     traZODone  HCl; Take  1 tablet 1-2 hours  before Bedtime  as needed for Sleep  Dispense: 90 tablet; Refill: 3  Hyperlipidemia, mixed -     Atorvastatin  Calcium ; TAKE 1 TABLET BY MOUTH EVERY DAY FOR CHOLESTEROL  Dispense: 90 tablet; Refill: 1   - Refill losartan , atorvastatin , trazodone . - He will do ambulatory blood pressure measurements and notify me if consistently above 130/80. - For his sciatica will give 60 mg of IM Toradol  in office today as well as a prednisone  taper.  Back stretches provided.  Time spent: 46 minutes reviewing chart, interviewing and examining patient and formulating plan of care.      Marguerita Shih, MD [facility]

## 2024-01-22 ENCOUNTER — Emergency Department (HOSPITAL_BASED_OUTPATIENT_CLINIC_OR_DEPARTMENT_OTHER)
Admission: EM | Admit: 2024-01-22 | Discharge: 2024-01-22 | Disposition: A | Discharge status: Home / Self Care | Attending: Emergency Medicine | Admitting: Emergency Medicine

## 2024-01-22 ENCOUNTER — Encounter (HOSPITAL_BASED_OUTPATIENT_CLINIC_OR_DEPARTMENT_OTHER): Payer: Self-pay

## 2024-01-22 ENCOUNTER — Other Ambulatory Visit: Payer: Self-pay

## 2024-01-22 ENCOUNTER — Emergency Department (HOSPITAL_BASED_OUTPATIENT_CLINIC_OR_DEPARTMENT_OTHER)

## 2024-01-22 DIAGNOSIS — M545 Low back pain, unspecified: Secondary | ICD-10-CM | POA: Diagnosis present

## 2024-01-22 DIAGNOSIS — Z79899 Other long term (current) drug therapy: Secondary | ICD-10-CM | POA: Diagnosis not present

## 2024-01-22 DIAGNOSIS — M5116 Intervertebral disc disorders with radiculopathy, lumbar region: Secondary | ICD-10-CM | POA: Diagnosis not present

## 2024-01-22 DIAGNOSIS — M4726 Other spondylosis with radiculopathy, lumbar region: Secondary | ICD-10-CM | POA: Diagnosis not present

## 2024-01-22 DIAGNOSIS — M79652 Pain in left thigh: Secondary | ICD-10-CM | POA: Diagnosis not present

## 2024-01-22 DIAGNOSIS — M5416 Radiculopathy, lumbar region: Secondary | ICD-10-CM | POA: Diagnosis not present

## 2024-01-22 DIAGNOSIS — M48061 Spinal stenosis, lumbar region without neurogenic claudication: Secondary | ICD-10-CM | POA: Diagnosis not present

## 2024-01-22 MED ORDER — DIAZEPAM 5 MG PO TABS
5.0000 mg | ORAL_TABLET | Freq: Once | ORAL | Status: AC
  Administered 2024-01-22: 5 mg via ORAL
  Filled 2024-01-22: qty 1

## 2024-01-22 MED ORDER — OXYCODONE-ACETAMINOPHEN 5-325 MG PO TABS
1.0000 | ORAL_TABLET | Freq: Once | ORAL | Status: AC
  Administered 2024-01-22: 1 via ORAL
  Filled 2024-01-22: qty 1

## 2024-01-22 MED ORDER — OXYCODONE-ACETAMINOPHEN 5-325 MG PO TABS: 1.0000 | ORAL_TABLET | Freq: Four times a day (QID) | ORAL | 0 refills | Status: DC | PRN

## 2024-01-22 MED ORDER — DIAZEPAM 5 MG PO TABS: 5.0000 mg | ORAL_TABLET | Freq: Two times a day (BID) | ORAL | 0 refills | Status: DC | PRN

## 2024-01-22 NOTE — ED Triage Notes (Signed)
 Patient arrives with worsening back pain (left lower) x3 days. Patient states that he was seen at his PCP yesterday and started on steroids and given a Toradol  injection with minimal relief. Rates pain an 8/10. Concerned that he is having sciatic pain.

## 2024-01-22 NOTE — Discharge Instructions (Signed)
 Please continue the steroid taper although your orthopedic doctor know about this.  Contact neurosurgery for outpatient follow-up.  We are prescribing a short course of narcotic pain medication and medication for spasm.  These medications may make you tired dizzy nauseous and constipated.  Please use care with them.  Return if any worsening or concerning symptoms

## 2024-01-22 NOTE — ED Provider Notes (Signed)
 [place] EMERGENCY DEPARTMENT AT [facility] Provider Note   CSN: [id number] Arrival date & time: 01/22/24  1633     History  Chief Complaint  Patient presents with   Back Pain    Arthur Garcia is a 73 y.o. male.  He is here with complaint of left buttock pain radiating down to to his lower leg on the left that is been going on more severely for the last 3 days.  He had ankle surgery back in January and he has been doing therapy on his left leg since then for rehabilitation.  He said he has been sore in the leg before but this is a new type of pain.  No swelling no numbness or weakness.  Having difficulty ambulating but also bothers him and difficulty sleeping.  Saw his primary care doctor who put him on prednisone  yesterday.  Tried some leftover oxycodone  without improvement.  No fevers.  No bowel or bladder incontinence.  The history is provided by the patient.  Back Pain Location:  Gluteal region Quality:  Aching Radiates to:  L posterior upper leg Pain severity:  Severe Pain is:  Same all the time Onset quality:  Gradual Duration:  3 days Timing:  Constant Progression:  Unchanged Chronicity:  New Relieved by:  Nothing Worsened by:  Ambulation Ineffective treatments:  NSAIDs and narcotics (steroid taper) Associated symptoms: leg pain   Associated symptoms: no abdominal pain, no bladder incontinence, no bowel incontinence, no chest pain, no fever, no numbness and no weakness        Home Medications Prior to Admission medications   Medication Sig Start Date End Date Taking? Authorizing Provider  atorvastatin  (LIPITOR) 20 MG tablet TAKE 1 TABLET BY MOUTH EVERY DAY FOR CHOLESTEROL 01/21/24   Zilphia Hilt, Charyl Coppersmith, MD  Cholecalciferol (VITAMIN D -3) 5000 UNITS TABS Take by mouth daily. Alternates between 5000 and 78295    [provider]  losartan  (COZAAR ) 50 MG tablet TAKE 1 TABLET DAILY FOR BLOOD PRESSURE 01/21/24   Zilphia Hilt, Charyl Coppersmith, MD   MELOXICAM  PO Take by mouth. Take 1/2 tablet twice daily    [provider]  predniSONE  (STERAPRED UNI-PAK 21 TAB) 10 MG (21) TBPK tablet Take as directed 01/21/24   Zilphia Hilt, Charyl Coppersmith, MD  tadalafil  (CIALIS ) 5 MG tablet Take 1 tablet  Daily  for Prostate 02/03/23   Vangie Genet, MD  traZODone  (DESYREL ) 100 MG tablet Take  1 tablet 1-2 hours  before Bedtime  as needed for Sleep 01/21/24   Zilphia Hilt, Charyl Coppersmith, MD  vitamin B-12 (CYANOCOBALAMIN ) 100 MCG tablet Take 100 mcg by mouth daily.    [provider]      Allergies    Patient has no known allergies.    Review of Systems   Review of Systems  Constitutional:  Negative for fever.  Cardiovascular:  Negative for chest pain.  Gastrointestinal:  Negative for abdominal pain and bowel incontinence.  Genitourinary:  Negative for bladder incontinence.  Musculoskeletal:  Positive for back pain.  Neurological:  Negative for weakness and numbness.    Physical Exam Updated Vital Signs BP (!) 167/89   Pulse 85   Temp 99.3 F (37.4 C) (Oral)   Resp 20   Ht 6\' 2"  (1.88 m)   Wt 93.7 kg   SpO2 98%   BMI 26.52 kg/m  Physical Exam Vitals and nursing note reviewed.  Constitutional:      Appearance: He is well-developed.  HENT:  Head: Normocephalic and atraumatic.  Eyes:     Conjunctiva/sclera: Conjunctivae normal.  Cardiovascular:     Rate and Rhythm: Normal rate and regular rhythm.     Heart sounds: No murmur heard. Pulmonary:     Effort: Pulmonary effort is normal. No respiratory distress.     Breath sounds: Normal breath sounds.  Abdominal:     Palpations: Abdomen is soft.     Tenderness: There is no abdominal tenderness.  Musculoskeletal:        General: No deformity. Normal range of motion.     Cervical back: Neck supple.  Skin:    General: Skin is warm and dry.  Neurological:     General: No focal deficit present.     Mental Status: He is alert.     GCS: GCS eye subscore is 4. GCS  verbal subscore is 5. GCS motor subscore is 6.     Sensory: No sensory deficit.     Motor: No weakness.     Comments: He has 5 out of 5 strength in his left lower extremity at hip quadricep ankle and EHL.  Normal sensation.  Distal pulses intact.     ED Results / Procedures / Treatments   Labs (all labs ordered are listed, but only abnormal results are displayed) Labs Reviewed - No data to display  EKG None  Radiology CT Lumbar Spine Wo Contrast Result Date: 01/22/2024 CLINICAL DATA:  Lumbar radiculopathy, left radicular symptoms. EXAM: CT LUMBAR SPINE WITHOUT CONTRAST TECHNIQUE: Multidetector CT imaging of the lumbar spine was performed without intravenous contrast administration. Multiplanar CT image reconstructions were also generated. RADIATION DOSE REDUCTION: This exam was performed according to the departmental dose-optimization program which includes automated exposure control, adjustment of the mA and/or kV according to patient size and/or use of iterative reconstruction technique. COMPARISON:  MRI lumbar spine 07/13/2016 FINDINGS: Segmentation: 5 lumbar type vertebrae. Alignment: Straightening of the normal lumbar lordosis. Similar trace retrolisthesis of L2 on L3. Vertebrae: No compression fracture or displaced fracture in the lumbar spine. Schmorl's nodes at multiple levels. Focal cystic change versus orals node involving the left posterior aspect of the L2 superior endplate. No suspicious osseous lesion. Paraspinal and other soft tissues: The visualized paraspinal soft tissues are unremarkable. Moderate atherosclerosis of the abdominal aorta and branch vessels. 1.3 cm cyst in the posterior aspect of the partially visualized left kidney. Disc levels: Mild disc space narrowing at L2-3 moderate disc space narrowing at L4-5. Vacuum disc phenomena noted at L5-S1. Disc bulges at multiple levels. Disc bulge and facet arthrosis at L2-3 results in mild spinal canal stenosis. Disc bulge and facet  arthrosis at L3-4 resulting in mild spinal canal stenosis. Disc bulge, moderate facet arthrosis, and thickening of the ligamentum flavum at L4-5 resulting in moderate to severe spinal canal stenosis. Moderate bilateral foraminal stenosis at L4-5. Additional mild foraminal stenosis at multiple levels. IMPRESSION: No acute abnormality of the lumbar spine. Degenerative changes as above. Moderate to severe spinal canal stenosis at L4-5. Multilevel foraminal stenosis, greatest and moderate at L4-5. Consider MRI for further evaluation if clinically indicated. Electronically Signed   By: Denny Flack M.D.   On: 01/22/2024 19:28    Procedures Procedures    Medications Ordered in ED Medications  oxyCODONE -acetaminophen  (PERCOCET/ROXICET) 5-325 MG per tablet 1 tablet (has no administration in time range)  diazepam  (VALIUM ) tablet 5 mg (has no administration in time range)    ED Course/ Medical Decision Making/ A&P Clinical Course as of 01/23/24 1119  Fri Jan 22, 2024  1951 Patient feels a bit better after the pain medication.  His CAT scan is showing significant degenerative changes but no acute fracture.  Will give him contact information for outpatient neurosurgery.  Return instructions discussed [MB]    Clinical Course User Index [MB] Tonya Fredrickson, MD                                 Medical Decision Making Amount and/or Complexity of Data Reviewed Radiology: ordered.  Risk Prescription drug management.   This patient complains of worsening left buttock into left thigh pain; this involves an extensive number of treatment Options and is a complaint that carries with it a high risk of complications and morbidity. The differential includes sciatica, spinal fracture, radiculopathy, musculoskeletal pain I ordered medication oral pain medication and reviewed PMP when indicated. I ordered imaging studies which included CT lumbar and I independently    visualized and interpreted imaging  which showed no acute findings, does have significant degenerative changes Additional history obtained from patient's wife Previous records obtained and reviewed in epic including recent PCP visit for same presentation Social determinants considered, no significant barriers Critical Interventions: None  After the interventions stated above, I reevaluated the patient and found patient's pain is improved and he is intact neurovascularly Admission and further testing considered, will provide prescription for pain medication and diazepam  for muscle relaxation.  Will need close follow-up with PCP and orthopedic or spine.  Given contact information.  Return instructions discussed         Final Clinical Impression(s) / ED Diagnoses Final diagnoses:  Lumbar radiculopathy    Rx / DC Orders ED Discharge Orders          Ordered    oxyCODONE -acetaminophen  (PERCOCET/ROXICET) 5-325 MG tablet  Every 6 hours PRN        01/22/24 1952    diazepam  (VALIUM ) 5 MG tablet  Every 12 hours PRN        01/22/24 1952              Tonya Fredrickson, MD 01/23/24 1121

## 2024-01-26 ENCOUNTER — Emergency Department (HOSPITAL_COMMUNITY)

## 2024-01-26 ENCOUNTER — Encounter (HOSPITAL_COMMUNITY): Payer: Self-pay

## 2024-01-26 ENCOUNTER — Emergency Department (HOSPITAL_COMMUNITY)
Admission: EM | Admit: 2024-01-26 | Discharge: 2024-01-27 | Disposition: A | Discharge status: Home / Self Care | Attending: Emergency Medicine | Admitting: Emergency Medicine

## 2024-01-26 ENCOUNTER — Other Ambulatory Visit: Payer: Self-pay

## 2024-01-26 DIAGNOSIS — Z79899 Other long term (current) drug therapy: Secondary | ICD-10-CM | POA: Diagnosis not present

## 2024-01-26 DIAGNOSIS — I10 Essential (primary) hypertension: Secondary | ICD-10-CM | POA: Diagnosis not present

## 2024-01-26 DIAGNOSIS — M5417 Radiculopathy, lumbosacral region: Secondary | ICD-10-CM | POA: Diagnosis not present

## 2024-01-26 DIAGNOSIS — M5440 Lumbago with sciatica, unspecified side: Secondary | ICD-10-CM | POA: Insufficient documentation

## 2024-01-26 DIAGNOSIS — R29898 Other symptoms and signs involving the musculoskeletal system: Secondary | ICD-10-CM | POA: Diagnosis not present

## 2024-01-26 DIAGNOSIS — M2548 Effusion, other site: Secondary | ICD-10-CM | POA: Diagnosis not present

## 2024-01-26 DIAGNOSIS — M5136 Other intervertebral disc degeneration, lumbar region with discogenic back pain only: Secondary | ICD-10-CM | POA: Diagnosis not present

## 2024-01-26 DIAGNOSIS — N189 Chronic kidney disease, unspecified: Secondary | ICD-10-CM | POA: Diagnosis not present

## 2024-01-26 DIAGNOSIS — M545 Low back pain, unspecified: Secondary | ICD-10-CM

## 2024-01-26 DIAGNOSIS — K5730 Diverticulosis of large intestine without perforation or abscess without bleeding: Secondary | ICD-10-CM | POA: Diagnosis not present

## 2024-01-26 DIAGNOSIS — M5459 Other low back pain: Secondary | ICD-10-CM | POA: Diagnosis not present

## 2024-01-26 DIAGNOSIS — M48061 Spinal stenosis, lumbar region without neurogenic claudication: Secondary | ICD-10-CM | POA: Diagnosis not present

## 2024-01-26 DIAGNOSIS — M79605 Pain in left leg: Secondary | ICD-10-CM | POA: Diagnosis not present

## 2024-01-26 DIAGNOSIS — R531 Weakness: Secondary | ICD-10-CM | POA: Diagnosis not present

## 2024-01-26 LAB — CBC WITH DIFFERENTIAL/PLATELET
Abs Immature Granulocytes: 0.05 10*3/uL (ref 0.00–0.07)
Basophils Absolute: 0 10*3/uL (ref 0.0–0.1)
Basophils Relative: 0 %
Eosinophils Absolute: 0 10*3/uL (ref 0.0–0.5)
Eosinophils Relative: 0 %
HCT: 42.3 % (ref 39.0–52.0)
Hemoglobin: 13.8 g/dL (ref 13.0–17.0)
Immature Granulocytes: 1 %
Lymphocytes Relative: 7 %
Lymphs Abs: 0.5 10*3/uL — ABNORMAL LOW (ref 0.7–4.0)
MCH: 30.1 pg (ref 26.0–34.0)
MCHC: 32.6 g/dL (ref 30.0–36.0)
MCV: 92.4 fL (ref 80.0–100.0)
Monocytes Absolute: 0.1 10*3/uL (ref 0.1–1.0)
Monocytes Relative: 2 %
Neutro Abs: 6.5 10*3/uL (ref 1.7–7.7)
Neutrophils Relative %: 90 %
Platelets: 223 10*3/uL (ref 150–400)
RBC: 4.58 MIL/uL (ref 4.22–5.81)
RDW: 12.2 % (ref 11.5–15.5)
WBC: 7.2 10*3/uL (ref 4.0–10.5)
nRBC: 0 % (ref 0.0–0.2)

## 2024-01-26 LAB — BASIC METABOLIC PANEL WITH GFR
Anion gap: 8 (ref 5–15)
BUN: 33 mg/dL — ABNORMAL HIGH (ref 8–23)
CO2: 27 mmol/L (ref 22–32)
Calcium: 9.1 mg/dL (ref 8.9–10.3)
Chloride: 103 mmol/L (ref 98–111)
Creatinine, Ser: 0.91 mg/dL (ref 0.61–1.24)
GFR, Estimated: >60 mL/min (ref >60)
Glucose, Bld: 165 mg/dL — ABNORMAL HIGH (ref 70–99)
Potassium: 4.2 mmol/L (ref 3.5–5.1)
Sodium: 138 mmol/L (ref 135–145)

## 2024-01-26 MED ORDER — TRAZODONE HCL 50 MG PO TABS
100.0000 mg | ORAL_TABLET | Freq: Every evening | ORAL | Status: DC | PRN
  Administered 2024-01-26: 100 mg via ORAL
  Filled 2024-01-26: qty 2

## 2024-01-26 MED ORDER — PREDNISONE 20 MG PO TABS
50.0000 mg | ORAL_TABLET | Freq: Once | ORAL | Status: DC
Start: 2024-01-27 — End: 2024-01-27
  Administered 2024-01-27: 50 mg via ORAL
  Filled 2024-01-26: qty 3

## 2024-01-26 MED ORDER — LOSARTAN POTASSIUM 50 MG PO TABS
50.0000 mg | ORAL_TABLET | Freq: Every day | ORAL | Status: DC
  Administered 2024-01-26: 50 mg via ORAL
  Filled 2024-01-26 (×2): qty 1

## 2024-01-26 MED ORDER — OXYCODONE-ACETAMINOPHEN 5-325 MG PO TABS
1.0000 | ORAL_TABLET | Freq: Four times a day (QID) | ORAL | Status: DC | PRN
  Administered 2024-01-26 – 2024-01-27 (×2): 1 via ORAL
  Filled 2024-01-26 (×2): qty 1

## 2024-01-26 MED ORDER — DIAZEPAM 5 MG PO TABS
5.0000 mg | ORAL_TABLET | Freq: Two times a day (BID) | ORAL | Status: DC | PRN
  Administered 2024-01-27: 5 mg via ORAL
  Filled 2024-01-26: qty 1

## 2024-01-26 MED ORDER — FENTANYL CITRATE PF 50 MCG/ML IJ SOSY
50.0000 ug | PREFILLED_SYRINGE | Freq: Once | INTRAMUSCULAR | Status: AC
  Administered 2024-01-26: 50 ug via INTRAVENOUS
  Filled 2024-01-26: qty 1

## 2024-01-26 MED ORDER — ATORVASTATIN CALCIUM 10 MG PO TABS
20.0000 mg | ORAL_TABLET | Freq: Every day | ORAL | Status: DC
Start: 2024-01-26 — End: 2024-01-27
  Administered 2024-01-26: 20 mg via ORAL
  Filled 2024-01-26 (×2): qty 2

## 2024-01-26 MED ORDER — DEXAMETHASONE SODIUM PHOSPHATE 10 MG/ML IJ SOLN
10.0000 mg | Freq: Once | INTRAMUSCULAR | Status: AC
  Administered 2024-01-26: 10 mg via INTRAVENOUS
  Filled 2024-01-26: qty 1

## 2024-01-26 MED ORDER — KETOROLAC TROMETHAMINE 30 MG/ML IJ SOLN
15.0000 mg | Freq: Once | INTRAMUSCULAR | Status: AC
  Administered 2024-01-26: 15 mg via INTRAVENOUS
  Filled 2024-01-26: qty 1

## 2024-01-26 MED ORDER — PREGABALIN 50 MG PO CAPS
75.0000 mg | ORAL_CAPSULE | Freq: Once | ORAL | Status: AC
  Administered 2024-01-26: 75 mg via ORAL
  Filled 2024-01-26: qty 1

## 2024-01-26 MED ORDER — PREGABALIN 25 MG PO CAPS
75.0000 mg | ORAL_CAPSULE | Freq: Two times a day (BID) | ORAL | Status: DC
  Administered 2024-01-26 – 2024-01-27 (×2): 75 mg via ORAL
  Filled 2024-01-26 (×2): qty 3

## 2024-01-26 MED ORDER — METHOCARBAMOL 1000 MG/10ML IJ SOLN
500.0000 mg | Freq: Once | INTRAMUSCULAR | Status: AC
  Administered 2024-01-26: 500 mg via INTRAVENOUS
  Filled 2024-01-26: qty 10

## 2024-01-26 NOTE — ED Triage Notes (Signed)
 PT arrived via [facility] from [place] [facility] from home, c/c of back pain, rated 10/10 on ambulation, 0/10 at rest. Aox4.  20G L forearm [facility] vitals:  175/95, HR 95, O2, 96% RA RR 18

## 2024-01-26 NOTE — ED Triage Notes (Signed)
 Patient presented to ER with lower back pain x 5 days. Patient has had 2 back surgeries in the past, but now the pain is so bad he can't walk. Denies having any incontinent issues.

## 2024-01-26 NOTE — ED Provider Notes (Signed)
 Care of patient assumed from Dr. Lewis Red.  This patient presenting with low back pain for the past 5 days.  History of prior surgeries.  MRI showed areas of severe stenoses.  Case discussed with neurosurgery who requests transfer to [facility] for evaluation. Physical Exam  BP 130/78   Pulse 81   Temp 97.9 F (36.6 C)   Resp 16   Ht 6\' 2"  (1.88 m)   Wt 94 kg   SpO2 96%   BMI 26.61 kg/m   Physical Exam Vitals and nursing note reviewed.  Constitutional:      General: He is not in acute distress.    Appearance: Normal appearance. He is well-developed. He is not ill-appearing, toxic-appearing or diaphoretic.  HENT:     Head: Normocephalic and atraumatic.     Right Ear: External ear normal.     Left Ear: External ear normal.     Nose: Nose normal.     Mouth/Throat:     Mouth: Mucous membranes are moist.  Eyes:     Extraocular Movements: Extraocular movements intact.     Conjunctiva/sclera: Conjunctivae normal.  Cardiovascular:     Rate and Rhythm: Normal rate and regular rhythm.  Pulmonary:     Effort: Pulmonary effort is normal. No respiratory distress.  Abdominal:     General: There is no distension.     Palpations: Abdomen is soft.  Musculoskeletal:        General: No swelling or deformity.     Cervical back: Normal range of motion and neck supple.  Skin:    General: Skin is warm and dry.     Coloration: Skin is not jaundiced or pale.  Neurological:     General: No focal deficit present.     Mental Status: He is alert and oriented to person, place, and time.  Psychiatric:        Mood and Affect: Mood normal.        Behavior: Behavior normal.     Procedures  Procedures  ED Course / MDM    Medical Decision Making Amount and/or Complexity of Data Reviewed Labs: ordered. Radiology: ordered.  Risk Prescription drug management.   On assessment, patient resting on stretcher.  States that his pain is controlled at this time.  He is agreeable to transfer to [facility].  Patient to be transferred for neurosurgery evaluation.  Accepting physician is Dr. Ranelle Buys.       Iva Mariner, MD 01/26/24 [phone]

## 2024-01-26 NOTE — ED Provider Notes (Signed)
 [place] EMERGENCY DEPARTMENT AT [facility] Provider Note   CSN: [id number] Arrival date & time: 01/26/24  8295     History  Chief Complaint  Patient presents with   Back Pain    Arthur Garcia is a 73 y.o. male.  HPI    73 year old male comes in with chief complaint of back pain.  Patient has history of hyperlipidemia, CKD and chronic spine disease.  Patient had surgical repair of his spine on 2 separate occasions by Dr. Nigel Bart about 5 years ago.  He recently had ankle repair done.  Patient states that he started having some ankle pain few days ago.   Home Medications Prior to Admission medications   Medication Sig Start Date End Date Taking? Authorizing Provider  atorvastatin  (LIPITOR) 20 MG tablet TAKE 1 TABLET BY MOUTH EVERY DAY FOR CHOLESTEROL 01/21/24  Yes Zilphia Hilt, Charyl Coppersmith, MD  Cholecalciferol (VITAMIN D -3) 5000 UNITS TABS Take by mouth daily. Alternates between 5000 and 62130   Yes [provider]  diazepam  (VALIUM ) 5 MG tablet Take 1 tablet (5 mg total) by mouth every 12 (twelve) hours as needed for muscle spasms. 01/22/24  Yes Tonya Fredrickson, MD  losartan  (COZAAR ) 50 MG tablet TAKE 1 TABLET DAILY FOR BLOOD PRESSURE 01/21/24  Yes Zilphia Hilt, Charyl Coppersmith, MD  oxyCODONE -acetaminophen  (PERCOCET/ROXICET) 5-325 MG tablet Take 1-2 tablets by mouth every 6 (six) hours as needed for severe pain (pain score 7-10). 01/27/24  Yes Mozell Arias, MD  traZODone  (DESYREL ) 100 MG tablet Take  1 tablet 1-2 hours  before Bedtime  as needed for Sleep 01/21/24  Yes Zilphia Hilt, Charyl Coppersmith, MD  vitamin B-12 (CYANOCOBALAMIN ) 100 MCG tablet Take 100 mcg by mouth daily.   Yes [provider]  predniSONE  (DELTASONE ) 10 MG tablet Take 10 mg by mouth as directed. Patient not taking: Reported on 01/26/2024 01/21/24   [provider]  tadalafil  (CIALIS ) 5 MG tablet Take 1 tablet  Daily  for Prostate Patient not taking: Reported on 01/26/2024  02/03/23   Vangie Genet, MD      Allergies    Patient has no known allergies.    Review of Systems   Review of Systems  All other systems reviewed and are negative.   Physical Exam Updated Vital Signs BP (!) 164/91   Pulse 69   Temp 97.8 F (36.6 C) (Oral)   Resp 18   Ht 6\' 2"  (1.88 m)   Wt 94 kg   SpO2 90%   BMI 26.61 kg/m  Physical Exam Vitals and nursing note reviewed.  Constitutional:      Appearance: He is well-developed.  HENT:     Head: Atraumatic.  Cardiovascular:     Rate and Rhythm: Normal rate.  Pulmonary:     Effort: Pulmonary effort is normal.  Musculoskeletal:     Cervical back: Neck supple.     Comments: Pt has tenderness over the lumbar region No step offs, no erythema. Pt has 2+ patellar reflex bilaterally. Able to discriminate between sharp and dull. UNABLE to ambulate due to pain   Skin:    General: Skin is warm.  Neurological:     Mental Status: He is alert and oriented to person, place, and time.     ED Results / Procedures / Treatments   Labs (all labs ordered are listed, but only abnormal results are displayed) Labs Reviewed  BASIC METABOLIC PANEL WITH GFR - Abnormal; Notable for the following components:  Result Value   Glucose, Bld 165 (*)    BUN 33 (*)    All other components within normal limits  CBC WITH DIFFERENTIAL/PLATELET - Abnormal; Notable for the following components:   Lymphs Abs 0.5 (*)    All other components within normal limits    EKG None  Radiology No results found.   Procedures Procedures    Medications Ordered in ED Medications  dexamethasone  (DECADRON ) injection 10 mg (10 mg Intravenous Given 01/26/24 1000)  ketorolac  (TORADOL ) 30 MG/ML injection 15 mg (15 mg Intravenous Given 01/26/24 1000)  fentaNYL  (SUBLIMAZE ) injection 50 mcg (50 mcg Intravenous Given 01/26/24 1005)  methocarbamol  (ROBAXIN ) injection 500 mg (500 mg Intravenous Given 01/26/24 1109)  pregabalin  (LYRICA ) capsule 75 mg (75 mg  Oral Given 01/26/24 1613)    ED Course/ Medical Decision Making/ A&P                                 Medical Decision Making Amount and/or Complexity of Data Reviewed Labs: ordered. Radiology: ordered.  Risk Prescription drug management.   Patient comes in with chief complaint of back pain. He has known history of chronic degenerative spine disease.  In the last 5 days, his back pain is worsened to the point now he has difficulty walking because of his pain.  They had to call 911 today.  Patient was supposed to go for his follow-up orthopedic visit, from recent ankle surgery, but could not even get out of the bed because of pain.  Patient was seen in the ER recently.  He had CT scan, independent interpreted the CT scan.  There is no evidence of significant misalignment.  Patient neuroexam is reassuring.  No red flags for cord compression.  Differential diagnosis for this patient includes severe spinal stenosis, herniated disc leading to significant impingement.  MRI ordered.  MRI confirms significant disease burden of the L4-L5.  I consulted neurosurgery and spoke with Easter Golden. They will try to schedule patient for procedure next week.  Patient not comfortable going home.  We will consult TOC for placement.  Final Clinical Impression(s) / ED Diagnoses Final diagnoses:  Low back pain, unspecified back pain laterality, unspecified chronicity, unspecified whether sciatica present    Rx / DC Orders ED Discharge Orders          Ordered    oxyCODONE -acetaminophen  (PERCOCET/ROXICET) 5-325 MG tablet  Every 6 hours PRN        01/27/24 1114              Deatra Face, MD 01/29/24 2138

## 2024-01-26 NOTE — ED Provider Notes (Signed)
 Patient presenting from [facility] for neurosurgery evaluation after having severe spinal stenosis on MRI.  Calls placed to neurosurgery to have the patient evaluated.  If there are no plans for surgery we will keep the patient in ED opts for [facility] evaluation.  If he does require surgery intervention will get him admitted for this.  Physical Exam  BP (!) 164/91   Pulse 69   Temp 97.8 F (36.6 C) (Oral)   Resp 18   Ht 6\' 2"  (1.88 m)   Wt 94 kg   SpO2 90%   BMI 26.61 kg/m   Physical Exam Gen: NAD  Procedures  Procedures  ED Course / MDM    Medical Decision Making Amount and/or Complexity of Data Reviewed Labs: ordered. Radiology: ordered.  Risk Prescription drug management. Decision regarding hospitalization.   Discussed case with Darryl Endow from neurosurgery.  With interference with ADLs  will he will stay here under ED observation.  Neurosurgery team made aware that they can evaluate him here.        Carin Charleston, MD 01/26/24 2013    Carin Charleston, MD 01/27/24 [phone]

## 2024-01-27 DIAGNOSIS — M5440 Lumbago with sciatica, unspecified side: Secondary | ICD-10-CM | POA: Diagnosis not present

## 2024-01-27 DIAGNOSIS — M5417 Radiculopathy, lumbosacral region: Secondary | ICD-10-CM | POA: Diagnosis not present

## 2024-01-27 DIAGNOSIS — M48061 Spinal stenosis, lumbar region without neurogenic claudication: Secondary | ICD-10-CM | POA: Diagnosis not present

## 2024-01-27 MED ORDER — OXYCODONE-ACETAMINOPHEN 5-325 MG PO TABS: 1.0000 | ORAL_TABLET | Freq: Four times a day (QID) | ORAL | 0 refills | Status: DC | PRN

## 2024-01-27 NOTE — Evaluation (Signed)
 Physical Therapy Evaluation Patient Details Name: Arthur Garcia MRN: [id number] DOB: [birth year] Today's Date: 01/27/2024  History of Present Illness  Pt is a 73 yo male who came to [facility] for severe low back pain that radiated down L LE, was found to have severe stenosis at L4/5, was then transferred to [facility] ED for neurosurgery assessment regarding surgery. NWG:NFAO, HTN, HLD, L ankle arthroscopy with stabilization 10/21/2023, spine surgery in 2016   Clinical Impression  Pt came to ED with above. Per Dr. Charlestine Conquest note no surgical intervention is needed at this time and would like to try injections to the lumbar spine. Pt continues to have L LE pain requiring use of RW for safe ambulation. Pt unable to ambulate safely without AD. Both wife and patient agreed to use RW. At this time pt does not need any follow up therapy until pain managed. Pt with no further acute PT needs. Acute PT signing off. Please re-consult if needed in future.       If plan is discharge home, recommend the following: A little help with bathing/dressing/bathroom;Help with stairs or ramp for entrance;Assist for transportation;A little help with walking and/or transfers   Can travel by private vehicle        Equipment Recommendations None recommended by PT  Recommendations for Other Services       Functional Status Assessment Patient has had a recent decline in their functional status and demonstrates the ability to make significant improvements in function in a reasonable and predictable amount of time.     Precautions / Restrictions Precautions Precautions: Fall Restrictions Weight Bearing Restrictions Per Provider Order: No Other Position/Activity Restrictions: pt self L LE NWB due to L LE pain      Mobility  Bed Mobility Overal bed mobility: Modified Independent             General bed mobility comments: HOB flat, used log roll technique, increased time, noted pain/mildly labored effort     Transfers Overall transfer level: Needs assistance Equipment used: None, Rolling walker (2 wheels) Transfers: Sit to/from Stand Sit to Stand: Contact guard assist           General transfer comment: pt minA to steady when standing without RW, contact guard when standing with walker, verbal cues for safe hand placment, pt with minimal WBing through L LE    Ambulation/Gait Ambulation/Gait assistance: Contact guard assist, Min assist Gait Distance (Feet): 30 Feet Assistive device: Rolling walker (2 wheels), 1 person hand held assist Gait Pattern/deviations: Step-to pattern Gait velocity: dec Gait velocity interpretation: <1.31 ft/sec, indicative of household ambulator   General Gait Details: pt initially attempted to amb without RW however only made it 3 steps due to pain, pt given HHA on the R however was not enough support, pt began reaching for something to hold onto with the L. Pt then given RW and much began hopping on R foot. with encouragement pt able to tolerate placing L foot on floor and putting minimal weight through L LE.  Stairs            Wheelchair Mobility     Tilt Bed    Modified Rankin (Stroke Patients Only)       Balance Overall balance assessment: Needs assistance Sitting-balance support: Feet supported, No upper extremity supported Sitting balance-Leahy Scale: Good     Standing balance support: Bilateral upper extremity supported, During functional activity, Reliant on assistive device for balance Standing balance-Leahy Scale: Poor Standing balance comment: reliant on external  support                             Pertinent Vitals/Pain Pain Assessment Pain Assessment: 0-10 Pain Score: 7  Pain Location: L posterior hip radiating down to foot Pain Descriptors / Indicators: Numbness, Tingling, Burning Pain Intervention(s): Monitored during session, Patient requesting pain meds-RN notified    Home Living Family/patient expects to  be discharged to:: Private residence Living Arrangements: Spouse/significant other Available Help at Discharge: Family Type of Home: House Home Access: Stairs to enter Entrance Stairs-Rails: Can reach both Entrance Stairs-Number of Steps: 4   Home Layout: Two level;Able to live on main level with bedroom/bathroom Home Equipment: Rolling Walker (2 wheels)      Prior Function Prior Level of Function : Independent/Modified Independent;Driving (until this weekend)             Mobility Comments: amb without AD until last week when L LE pain worsened ADLs Comments: indep until over the last week     Extremity/Trunk Assessment   Upper Extremity Assessment Upper Extremity Assessment: Overall WFL for tasks assessed    Lower Extremity Assessment Lower Extremity Assessment: LLE deficits/detail LLE Deficits / Details: ROM to hip, knee, ankle limited to pain, only able to tolerate < 25% WBing on L LE LLE Sensation: decreased light touch    Cervical / Trunk Assessment Cervical / Trunk Assessment: Other exceptions (back pain)  Communication   Communication Communication: No apparent difficulties    Cognition Arousal: Alert Behavior During Therapy: WFL for tasks assessed/performed   PT - Cognitive impairments: No apparent impairments                       PT - Cognition Comments: per spouse pt is a little "loopy" and is inconsistent with his report of symptoms Following commands: Intact       Cueing Cueing Techniques: Verbal cues, Tactile cues     General Comments General comments (skin integrity, edema, etc.): VSS    Exercises     Assessment/Plan    PT Assessment Patient does not need any further PT services  PT Problem List         PT Treatment Interventions      PT Goals (Current goals can be found in the Care Plan section)  Acute Rehab PT Goals Patient Stated Goal: home PT Goal Formulation: With patient/family Time For Goal Achievement:  02/10/24 Potential to Achieve Goals: Fair    Frequency       Co-evaluation               AM-PAC PT "6 Clicks" Mobility  Outcome Measure Help needed turning from your back to your side while in a flat bed without using bedrails?: None Help needed moving from lying on your back to sitting on the side of a flat bed without using bedrails?: None Help needed moving to and from a bed to a chair (including a wheelchair)?: None Help needed standing up from a chair using your arms (e.g., wheelchair or bedside chair)?: A Little Help needed to walk in hospital room?: A Little Help needed climbing 3-5 steps with a railing? : A Lot 6 Click Score: 20    End of Session Equipment Utilized During Treatment: Gait belt Activity Tolerance: Patient limited by pain Patient left: in bed;with call bell/phone within reach;with family/visitor present Nurse Communication: Mobility status (need for pain medication, use RW when patient needs to get  up) PT Visit Diagnosis: Pain Pain - part of body:  (radiating back pain down L LE)    Time: 4098-1191 PT Time Calculation (min) (ACUTE ONLY): 19 min   Charges:                 Renaee Caro, PT, DPT Acute Rehabilitation Services Secure chat preferred Office #: [phone]   Jenna Moan 01/27/2024, 11:32 AM

## 2024-01-27 NOTE — Consult Note (Signed)
 Arthur Garcia is a 73 y.o. male Who has had increasing pain in the left lower extremity over the last week. He underwent left ankle surgery in January of this year and states that physical therapy caused the pain in the left lower extremity. This led to him being bedridden this past weekend. Though he could stand and navigate stairs in his home. MRI in the ED showed severe lumbar stenosis at 4/5. We were asked for an evaluation. Denies bowel and or bladder dysfunction. No falls, nor weakness. Pain is isolated to the left lower extremity.  No Known Allergies Past Medical History:  Diagnosis Date   Abnormal liver function test    Anxiety    Colon polyp    DDD (degenerative disc disease)    ED (erectile dysfunction) 04/05/2014   FHx: heart disease 01/28/2018   GERD (gastroesophageal reflux disease)    OTC   Hyperlipidemia    Hypertension    Insomnia   ' Past Surgical History:  Procedure Laterality Date   ADENOIDECTOMY     age 74   ANKLE ARTHROSCOPY WITH RECONSTRUCTION Left 10/21/2023   Procedure: LEFT ANKLE ARTHROSCOPY WITH OPEN STABILIZATION OF LATERAL ANKLE LIGAMENT COMPLEX(COMPLEX);  Surgeon: Ali Ink, MD;  Location: [facility];  Service: Orthopedics;  Laterality: Left;   APPENDECTOMY     DENTAL SURGERY     Dental implants   LAPAROSCOPIC CHOLECYSTECTOMY  05/2020   At surgery center, Dr. Dorrie Gaudier   REPAIR OF PERONEUS BREVIS TENDON Left 10/21/2023   Procedure: debridement OF PERONEUS BREVIS TENDON;  Surgeon: Ali Ink, MD;  Location:  SURGERY CENTER;  Service: Orthopedics;  Laterality: Left;   SPINE SURGERY  Aug 2016   Dr. Gaylia Kayser     VASECTOMY     Prior to Admission medications   Medication Sig Start Date End Date Taking? Authorizing Provider  atorvastatin  (LIPITOR) 20 MG tablet TAKE 1 TABLET BY MOUTH EVERY DAY FOR CHOLESTEROL 01/21/24  Yes Zilphia Hilt, Charyl Coppersmith, MD  Cholecalciferol (VITAMIN D -3) 5000 UNITS TABS Take by  mouth daily. Alternates between 5000 and 02725   Yes [provider]  diazepam  (VALIUM ) 5 MG tablet Take 1 tablet (5 mg total) by mouth every 12 (twelve) hours as needed for muscle spasms. 01/22/24  Yes Tonya Fredrickson, MD  losartan  (COZAAR ) 50 MG tablet TAKE 1 TABLET DAILY FOR BLOOD PRESSURE 01/21/24  Yes Zilphia Hilt, Charyl Coppersmith, MD  oxyCODONE -acetaminophen  (PERCOCET/ROXICET) 5-325 MG tablet Take 1 tablet by mouth every 6 (six) hours as needed for severe pain (pain score 7-10). 01/22/24  Yes Tonya Fredrickson, MD  traZODone  (DESYREL ) 100 MG tablet Take  1 tablet 1-2 hours  before Bedtime  as needed for Sleep 01/21/24  Yes Zilphia Hilt, Charyl Coppersmith, MD  vitamin B-12 (CYANOCOBALAMIN ) 100 MCG tablet Take 100 mcg by mouth daily.   Yes [provider]  aspirin EC 81 MG tablet Take 81 mg by mouth. Patient not taking: Reported on 01/26/2024 10/20/23   [provider]  predniSONE  (DELTASONE ) 10 MG tablet Take 10 mg by mouth as directed. Patient not taking: Reported on 01/26/2024 01/21/24   [provider]  tadalafil  (CIALIS ) 5 MG tablet Take 1 tablet  Daily  for Prostate Patient not taking: Reported on 01/26/2024 02/03/23   Vangie Genet, MD   Social History   Socioeconomic History   Marital status: Married    Spouse name: Not on file   Number of children: 2   Years  of education: Not on file   Highest education level: Not on file  Occupational History   Not on file  Tobacco Use   Smoking status: Former    Current packs/day: 0.00    Average packs/day: 1 pack/day for 45.0 years (45.0 ttl pk-yrs)    Types: Cigarettes    Start date: 05/09/1975    Quit date: 05/08/2020    Years since quitting: 3.7   Smokeless tobacco: Never   Tobacco comments:    quit using chantix    Substance and Sexual Activity   Alcohol use: Yes    Alcohol/week: 20.0 standard drinks of alcohol    Types: 20 Cans of beer per week    Comment: occ beer   Drug use: No   Sexual activity: Yes   Other Topics Concern   Not on file  Social History Narrative   One grandchild.  Works on farm Banker.     Social Drivers of Corporate investment banker Strain: Not on file  Food Insecurity: Not on file  Transportation Needs: Not on file  Physical Activity: Not on file  Stress: Not on file  Social Connections: Not on file  Intimate Partner Violence: Not on file   Family History  Problem Relation Age of Onset   Thyroid  disease Mother    Hypertension Mother    Osteoporosis Mother    Glaucoma Mother    Thyroid  disease Father    Hypertension Father    Hyperlipidemia Father    Heart disease Father        CHF   Heart disease Paternal Grandfather 53       Long standing "heart trouble"  MR LUMBAR SPINE WO CONTRAST Result Date: 01/26/2024 CLINICAL DATA:  73 year old male with low back pain, prior surgery. Left side radicular symptoms. EXAM: MRI LUMBAR SPINE WITHOUT CONTRAST TECHNIQUE: Multiplanar, multisequence MR imaging of the lumbar spine was performed. No intravenous contrast was administered. COMPARISON:  Lumbar spine CT 01/22/2024.  Lumbar MRI 07/13/2016. FINDINGS: Segmentation: Normal lumbar segmentation demonstrated on the recent CT, same numbering system used on the 2017 MRI. Alignment: Straightening of lumbar lordosis not significantly changed since 2017. No significant scoliosis or spondylolisthesis. Vertebrae: Normal background bone marrow signal. Patchy posteroinferior endplate marrow edema at L4. Nearby chronic appearing L4 inferior endplate Schmorl's node. Maintained vertebral height. Similar degenerative appearing Patchy and confluent marrow edema affecting the left posterior elements of L5 (series 7, image 13) and the lateral endplates of L5-S1 on both sides (series 7, image 4 on the right). No convincing regional soft tissue inflammation. Intact visible sacrum and SI joints. No other acute osseous abnormality identified. Conus medullaris and cauda equina: Conus extends  to the T12-L1 level. No lower spinal cord or conus signal abnormality. Cauda equina nerve roots appear normal above the L4 level. See details below. Paraspinal and other soft tissues: Diverticulosis of the large bowel. Otherwise negative for age visible abdominal viscera. Lumbar paraspinal soft tissues are within normal limits. Disc levels: Mild for age degeneration T11-T12 through L1-L2. L2-L3: Disc space loss. Circumferential disc or disc osteophyte complex asymmetric to the right with mild posterior element hypertrophy. Mild to moderate right lateral recess stenosis (right L3 nerve level series 9, image 19). No spinal stenosis. Mild to moderate right L2 foraminal stenosis. L3-L4: Disc space loss with circumferential disc bulge which is more symmetric. Mild to moderate facet and ligament flavum hypertrophy. Borderline spinal stenosis. No convincing lateral recess stenosis. Mild to moderate somewhat symmetric bilateral L3 foraminal stenosis.  L4-L5: Severe multifactorial spinal stenosis and lateral recess stenosis greater on the left (left L5 nerve level series 9, image 28) related to substantial disc space loss since 2017 with bulky circumferential disc osteophyte complex, severe ligament flavum hypertrophy and moderate to severe facet hypertrophy. Degenerative appearing bilateral facet joint fluid has progressed, and there is also evidence of a small left-side synovial cyst projecting into the lateral recess there on series 9, image 28 (2-3 mm). Moderate to severe left and moderate right L4 foraminal stenosis has also progressed. L5-S1: Disc degeneration with vacuum disc. Comparatively mild circumferential disc osteophyte complex. Previous right laminectomy and mild postoperative architectural distortion at the right lateral recess. Mild facet and ligament flavum hypertrophy otherwise. No spinal or convincing lateral recess stenosis. Mild to moderate L5 foraminal stenosis has increased since 2017. IMPRESSION: 1.  Symptomatic level favored to be L4-L5, where Severe spinal and left lateral recess stenosis is new since 2017, due to combined bulky disc osteophyte disease plus bulky posterior element degeneration and a tiny left-side synovial cyst. Query Left L5 radiculitis. Moderate to severe left greater than right L4 nerve level foraminal stenosis at that level also progressed since a 2017 MRI. And superimposed degenerative appearing endplate and posterior element marrow edema at L4 and L5. 2. Chronic postoperative changes on the right at L5-S1 with only mild progression of underlying degeneration since 2017. And comparatively mild lumbar spine degeneration at L3-L4. Electronically Signed   By: Marlise Simpers M.D.   On: 01/26/2024 11:06   Physical Exam Constitutional:      General: He is not in acute distress.    Appearance: Normal appearance. He is normal weight.  HENT:     Head: Normocephalic and atraumatic.     Nose: Nose normal.     Mouth/Throat:     Mouth: Mucous membranes are moist.     Pharynx: Oropharynx is clear.  Eyes:     Extraocular Movements: Extraocular movements intact.     Conjunctiva/sclera: Conjunctivae normal.     Pupils: Pupils are equal, round, and reactive to light.  Cardiovascular:     Rate and Rhythm: Normal rate and regular rhythm.  Pulmonary:     Effort: Pulmonary effort is normal.  Abdominal:     General: Abdomen is flat.  Musculoskeletal:        General: Normal range of motion.     Cervical back: Normal range of motion.  Skin:    General: Skin is warm and dry.  Neurological:     General: No focal deficit present.     Mental Status: He is alert and oriented to person, place, and time.     Cranial Nerves: Cranial nerves 2-12 are intact.     Sensory: Sensation is intact.     Motor: Motor function is intact.     Coordination: Coordination is intact.     Deep Tendon Reflexes: Reflexes are normal and symmetric.     Comments: Did not assess gait   Arthur Garcia is a 73 y.o.  male With lumbar stenosis and isolated left lower extremity pain. He would like to try injections to the lumbar spine which we will arrange for in the outpatient setting.  At this time there are no barriers to discharge. No operative intervention is planned.

## 2024-01-27 NOTE — ED Provider Notes (Addendum)
  Physical Exam  BP (!) 164/91   Pulse 69   Temp 97.8 F (36.6 C) (Oral)   Resp 18   Ht 6\' 2"  (1.88 m)   Wt 94 kg   SpO2 90%   BMI 26.61 kg/m   Physical Exam  Procedures  Procedures  ED Course / MDM    Medical Decision Making Amount and/or Complexity of Data Reviewed Labs: ordered. Radiology: ordered.  Risk Prescription drug management. Decision regarding hospitalization.   Pending neurosurgery evaluation today.       Mozell Arias, MD 01/27/24 [phone]  Discussed with Dr. Cabbell, who will see patient.      Mozell Arias, MD 01/27/24 [phone]

## 2024-01-27 NOTE — Progress Notes (Signed)
 PT Cancellation Note  Patient Details Name: ADVAY PILCHER MRN: [id number] DOB: [birth year]   Cancelled Treatment:    Reason Eval/Treat Not Completed: Other (comment). PT evaluation received. Per chart awaiting neurosurgery consult regarding possible surgery for severe stenosis in lower back. PT will also await N/S consult prior to assessing mobility.  Ruhi Kopke, PT, DPT Acute Rehabilitation Services Secure chat preferred Office #: [phone]    Jenna Moan 01/27/2024, 8:55 AM

## 2024-01-27 NOTE — ED Provider Notes (Signed)
  Physical Exam  BP (!) 164/91   Pulse 69   Temp 97.8 F (36.6 C) (Oral)   Resp 18   Ht 6\' 2"  (1.88 m)   Wt 94 kg   SpO2 90%   BMI 26.61 kg/m   Physical Exam  Procedures  Procedures  ED Course / MDM    Medical Decision Making Amount and/or Complexity of Data Reviewed Labs: ordered. Radiology: ordered.  Risk Prescription drug management.   Patient is now been seen by Dr. Michale Age.  Patient states he wants to go home.  States he can manage at home.  Will get outpatient injection.  Will discharge       Arthur Arias, MD 01/27/24 1014

## 2024-01-29 DIAGNOSIS — S93492A Sprain of other ligament of left ankle, initial encounter: Secondary | ICD-10-CM | POA: Diagnosis not present

## 2024-01-29 DIAGNOSIS — M7672 Peroneal tendinitis, left leg: Secondary | ICD-10-CM | POA: Diagnosis not present

## 2024-02-02 DIAGNOSIS — M5417 Radiculopathy, lumbosacral region: Secondary | ICD-10-CM | POA: Diagnosis not present

## 2024-02-25 DIAGNOSIS — R531 Weakness: Secondary | ICD-10-CM | POA: Diagnosis not present

## 2024-02-25 DIAGNOSIS — M48061 Spinal stenosis, lumbar region without neurogenic claudication: Secondary | ICD-10-CM | POA: Diagnosis not present

## 2024-02-25 DIAGNOSIS — M5417 Radiculopathy, lumbosacral region: Secondary | ICD-10-CM | POA: Diagnosis not present

## 2024-02-25 DIAGNOSIS — M545 Low back pain, unspecified: Secondary | ICD-10-CM | POA: Diagnosis not present

## 2024-02-25 DIAGNOSIS — M5116 Intervertebral disc disorders with radiculopathy, lumbar region: Secondary | ICD-10-CM | POA: Diagnosis not present

## 2024-03-03 DIAGNOSIS — M5417 Radiculopathy, lumbosacral region: Secondary | ICD-10-CM | POA: Diagnosis not present

## 2024-03-03 DIAGNOSIS — M5442 Lumbago with sciatica, left side: Secondary | ICD-10-CM | POA: Diagnosis not present

## 2024-03-04 DIAGNOSIS — M5442 Lumbago with sciatica, left side: Secondary | ICD-10-CM | POA: Diagnosis not present

## 2024-03-04 DIAGNOSIS — Z6825 Body mass index (BMI) 25.0-25.9, adult: Secondary | ICD-10-CM | POA: Diagnosis not present

## 2024-03-21 DIAGNOSIS — M48062 Spinal stenosis, lumbar region with neurogenic claudication: Secondary | ICD-10-CM | POA: Diagnosis not present

## 2024-03-21 DIAGNOSIS — M5442 Lumbago with sciatica, left side: Secondary | ICD-10-CM | POA: Diagnosis not present

## 2024-03-21 DIAGNOSIS — M5126 Other intervertebral disc displacement, lumbar region: Secondary | ICD-10-CM | POA: Diagnosis not present

## 2024-07-04 ENCOUNTER — Ambulatory Visit: Payer: Medicare Other | Admitting: Nurse Practitioner

## 2024-07-05 ENCOUNTER — Ambulatory Visit: Payer: Medicare Other | Admitting: Nurse Practitioner

## 2024-07-06 ENCOUNTER — Ambulatory Visit: Payer: Self-pay

## 2024-07-06 NOTE — Telephone Encounter (Signed)
 FYI Only or Action Required?: FYI only for provider.  Patient was last seen in primary care on 01/21/2024 by Theophilus Andrews, Tully GRADE, MD.  Called Nurse Triage reporting Eye Problem.  Symptoms began a week ago.  Interventions attempted: Other: Clean and dry, neosporin.  Symptoms are: unchanged.  Triage Disposition: See Physician Within 24 Hours  Patient/caregiver understands and will follow disposition?: Yes Reason for Disposition  Eyelid is red and painful (or tender to touch)  Answer Assessment - Initial Assessment Questions Patients wife calling on behalf of patient, thought it was a stye but won't go away, extremely red, off yellow drainage. Been keeping it clean and dry, using neosporin. Denies any swelling  1. LOCATION: Where is the redness? (e.g., eyeball or outer eyelids) Note: When callers say the eye is red, they usually mean the sclera (white of the eye) is red.       Lower left eyelid  2. ONE OR BOTH EYES: Is the redness in one or both eyes?      Just the left  3. ONSET: When did the redness start? (e.g., hours, days)      A week ago  4. EYELIDS: Are the eyelids red or swollen? If Yes, ask: How much?      Lower left eyelid 5. VISION: Do you have blurred vision?     Denies  6. ITCHING: Does it feel itchy? If so ask: How bad is it (Scale 1-10; or mild, moderate, severe)     Pt denies, wife states she will see him rubbing it   7. PAIN: Is it painful? If Yes, ask: How bad is the pain? (Scale 0-10; or none, mild, moderate, severe)     States its tender to touch  8. CONTACT LENS: Do you wear contacts?     Yes, but has not been wearing them lately  9. CAUSE: What do you think is causing the redness?     Unsure  10. OTHER SYMPTOMS: Do you have any other symptoms? (e.g., fever, runny nose, cough, vomiting)       Denies  Protocols used: Eye - Redness-A-AH  Copied from CRM [id number]. Topic: Clinical - Red Word Triage >> Jul 06, 2024  3:24  PM [place] A wrote: [facility] that prompted transfer to Nurse Triage: Patient has spot on left lower lid that is painful to touch and weeping. Has had for a week.

## 2024-07-07 ENCOUNTER — Ambulatory Visit: Admitting: Internal Medicine

## 2024-07-07 ENCOUNTER — Encounter: Payer: Self-pay | Admitting: Internal Medicine

## 2024-07-07 VITALS — BP 130/80 | HR 76 | Temp 97.5°F | Wt 195.5 lb

## 2024-07-07 DIAGNOSIS — H01115 Allergic dermatitis of left lower eyelid: Secondary | ICD-10-CM

## 2024-07-07 DIAGNOSIS — R808 Other proteinuria: Secondary | ICD-10-CM

## 2024-07-07 LAB — POC URINALSYSI DIPSTICK (AUTOMATED)

## 2024-07-07 NOTE — Addendum Note (Signed)
 Addended by: KATHRYNE MILLMAN B on: 07/07/2024 02:17 PM   Modules accepted: Orders

## 2024-07-07 NOTE — Telephone Encounter (Signed)
 noted

## 2024-07-07 NOTE — Progress Notes (Signed)
 A POC urine test was not done on this patient today.

## 2024-07-07 NOTE — Progress Notes (Signed)
 Established Patient Office Visit     CC/Reason for Visit: Left lower eyelid redness  HPI: Arthur Garcia is a 73 y.o. male who is coming in today for the above mentioned reasons.  He started noticing some discomfort of the center of his left lower eyelid.  For the past week his wife has been applying Neosporin on a Q-tip a couple times a day to the area.  Since starting this the redness has gotten worse.  There is no conjunctival or scleral erythema, no discharge.   Past Medical/Surgical History: Past Medical History:  Diagnosis Date   Abnormal liver function test    Anxiety    Colon polyp    DDD (degenerative disc disease)    ED (erectile dysfunction) 04/05/2014   FHx: heart disease 01/28/2018   GERD (gastroesophageal reflux disease)    OTC   Hyperlipidemia    Hypertension    Insomnia     Past Surgical History:  Procedure Laterality Date   ADENOIDECTOMY     age 63   ANKLE ARTHROSCOPY WITH RECONSTRUCTION Left 10/21/2023   Procedure: LEFT ANKLE ARTHROSCOPY WITH OPEN STABILIZATION OF LATERAL ANKLE LIGAMENT COMPLEX(COMPLEX);  Surgeon: Barton Drape, MD;  Location: [facility];  Service: Orthopedics;  Laterality: Left;   APPENDECTOMY     DENTAL SURGERY     Dental implants   LAPAROSCOPIC CHOLECYSTECTOMY  05/2020   At surgery center, Dr. Stevie   REPAIR OF PERONEUS BREVIS TENDON Left 10/21/2023   Procedure: debridement OF PERONEUS BREVIS TENDON;  Surgeon: Barton Drape, MD;  Location: [facility];  Service: Orthopedics;  Laterality: Left;   SPINE SURGERY  Aug 2016   Dr. Unice Lah     VASECTOMY      Social History:  reports that he quit smoking about 4 years ago. His smoking use included cigarettes. He started smoking about 49 years ago. He has a 45 pack-year smoking history. He has never used smokeless tobacco. He reports current alcohol use of about 20.0 standard drinks of alcohol per week. He reports that he does not use  drugs.  Allergies: No Known Allergies  Family History:  Family History  Problem Relation Age of Onset   Thyroid  disease Mother    Hypertension Mother    Osteoporosis Mother    Glaucoma Mother    Thyroid  disease Father    Hypertension Father    Hyperlipidemia Father    Heart disease Father        CHF   Heart disease Paternal Grandfather 71       Long standing heart trouble     Current Outpatient Medications:    atorvastatin  (LIPITOR) 20 MG tablet, TAKE 1 TABLET BY MOUTH EVERY DAY FOR CHOLESTEROL, Disp: 90 tablet, Rfl: 1   Cholecalciferol (VITAMIN D -3) 5000 UNITS TABS, Take by mouth daily. Alternates between 5000 and 89999, Disp: , Rfl:    diazepam  (VALIUM ) 5 MG tablet, Take 1 tablet (5 mg total) by mouth every 12 (twelve) hours as needed for muscle spasms., Disp: 10 tablet, Rfl: 0   losartan  (COZAAR ) 50 MG tablet, TAKE 1 TABLET DAILY FOR BLOOD PRESSURE, Disp: 90 tablet, Rfl: 3   oxyCODONE -acetaminophen  (PERCOCET/ROXICET) 5-325 MG tablet, Take 1-2 tablets by mouth every 6 (six) hours as needed for severe pain (pain score 7-10)., Disp: 16 tablet, Rfl: 0   tadalafil  (CIALIS ) 5 MG tablet, Take 1 tablet  Daily  for Prostate, Disp: 90 tablet, Rfl: 3   traZODone  (DESYREL )  100 MG tablet, Take  1 tablet 1-2 hours  before Bedtime  as needed for Sleep, Disp: 90 tablet, Rfl: 3   vitamin B-12 (CYANOCOBALAMIN ) 100 MCG tablet, Take 100 mcg by mouth daily., Disp: , Rfl:    predniSONE  (DELTASONE ) 10 MG tablet, Take 10 mg by mouth as directed. (Patient not taking: Reported on 01/26/2024), Disp: , Rfl:   Review of Systems:  Negative unless indicated in HPI.   Physical Exam: Vitals:   07/07/24 1304  BP: 130/80  Pulse: 76  Temp: (!) 97.5 F (36.4 C)  TempSrc: Oral  SpO2: 97%  Weight: 195 lb 8 oz (88.7 kg)    Body mass index is 25.1 kg/m.   Physical Exam Eyes:       Impression and Plan:  Allergic contact dermatitis of left lower eyelid   - Unsure what was the initial  inciting event, suspect worsening due to Neosporin contact dermatitis.  Advise discontinuation of use and for him to use natural tears as needed.  Time spent:22 minutes reviewing chart, interviewing and examining patient and formulating plan of care.     Tully Theophilus Andrews, MD [facility]

## 2024-07-07 NOTE — Addendum Note (Signed)
 Addended by: KATHRYNE MILLMAN B on: 07/07/2024 02:07 PM   Modules accepted: Orders

## 2024-07-15 ENCOUNTER — Other Ambulatory Visit: Payer: Self-pay | Admitting: Internal Medicine

## 2024-07-15 DIAGNOSIS — E782 Mixed hyperlipidemia: Secondary | ICD-10-CM

## 2024-08-10 ENCOUNTER — Ambulatory Visit: Admitting: Internal Medicine

## 2024-08-10 ENCOUNTER — Encounter: Payer: Self-pay | Admitting: Internal Medicine

## 2024-08-10 ENCOUNTER — Telehealth: Payer: Self-pay | Admitting: *Deleted

## 2024-08-10 VITALS — BP 130/80 | HR 75 | Temp 97.6°F | Ht 72.5 in | Wt 200.1 lb

## 2024-08-10 DIAGNOSIS — Z0000 Encounter for general adult medical examination without abnormal findings: Secondary | ICD-10-CM

## 2024-08-10 DIAGNOSIS — F17211 Nicotine dependence, cigarettes, in remission: Secondary | ICD-10-CM

## 2024-08-10 DIAGNOSIS — E559 Vitamin D deficiency, unspecified: Secondary | ICD-10-CM | POA: Diagnosis not present

## 2024-08-10 DIAGNOSIS — Z1211 Encounter for screening for malignant neoplasm of colon: Secondary | ICD-10-CM

## 2024-08-10 DIAGNOSIS — N528 Other male erectile dysfunction: Secondary | ICD-10-CM | POA: Diagnosis not present

## 2024-08-10 DIAGNOSIS — N182 Chronic kidney disease, stage 2 (mild): Secondary | ICD-10-CM | POA: Diagnosis not present

## 2024-08-10 DIAGNOSIS — R7302 Impaired glucose tolerance (oral): Secondary | ICD-10-CM

## 2024-08-10 DIAGNOSIS — I10 Essential (primary) hypertension: Secondary | ICD-10-CM

## 2024-08-10 DIAGNOSIS — Z23 Encounter for immunization: Secondary | ICD-10-CM

## 2024-08-10 DIAGNOSIS — K219 Gastro-esophageal reflux disease without esophagitis: Secondary | ICD-10-CM | POA: Diagnosis not present

## 2024-08-10 DIAGNOSIS — E782 Mixed hyperlipidemia: Secondary | ICD-10-CM

## 2024-08-10 LAB — CBC WITH DIFFERENTIAL/PLATELET
Basophils Absolute: 0.1 K/uL (ref 0.0–0.1)
Basophils Relative: 1.3 % (ref 0.0–3.0)
Eosinophils Absolute: 0.1 K/uL (ref 0.0–0.7)
Eosinophils Relative: 2.8 % (ref 0.0–5.0)
HCT: 40.9 % (ref 39.0–52.0)
Hemoglobin: 14 g/dL (ref 13.0–17.0)
Lymphocytes Relative: 25 % (ref 12.0–46.0)
Lymphs Abs: 1.1 K/uL (ref 0.7–4.0)
MCHC: 34.2 g/dL (ref 30.0–36.0)
MCV: 88.2 fl (ref 78.0–100.0)
Monocytes Absolute: 0.4 K/uL (ref 0.1–1.0)
Monocytes Relative: 9.1 % (ref 3.0–12.0)
Neutro Abs: 2.7 K/uL (ref 1.4–7.7)
Neutrophils Relative %: 61.8 % (ref 43.0–77.0)
Platelets: 221 K/uL (ref 150.0–400.0)
RBC: 4.63 Mil/uL (ref 4.22–5.81)
RDW: 13.1 % (ref 11.5–15.5)
WBC: 4.3 K/uL (ref 4.0–10.5)

## 2024-08-10 LAB — COMPREHENSIVE METABOLIC PANEL WITH GFR
ALT: 17 U/L (ref 0–53)
AST: 18 U/L (ref 0–37)
Albumin: 4.4 g/dL (ref 3.5–5.2)
Alkaline Phosphatase: 66 U/L (ref 39–117)
BUN: 17 mg/dL (ref 6–23)
CO2: 31 meq/L (ref 19–32)
Calcium: 9.6 mg/dL (ref 8.4–10.5)
Chloride: 102 meq/L (ref 96–112)
Creatinine, Ser: 1.15 mg/dL (ref 0.40–1.50)
GFR: 62.89 mL/min (ref >60.00)
Glucose, Bld: 107 mg/dL — ABNORMAL HIGH (ref 70–99)
Potassium: 4.4 meq/L (ref 3.5–5.1)
Sodium: 139 meq/L (ref 135–145)
Total Bilirubin: 0.8 mg/dL (ref 0.2–1.2)
Total Protein: 6.8 g/dL (ref 6.0–8.3)

## 2024-08-10 LAB — LIPID PANEL
Cholesterol: 114 mg/dL (ref 0–200)
HDL: 41.5 mg/dL (ref >39.00)
LDL Cholesterol: 58 mg/dL (ref 0–99)
NonHDL: 72.32
Total CHOL/HDL Ratio: 3
Triglycerides: 70 mg/dL (ref 0.0–149.0)
VLDL: 14 mg/dL (ref 0.0–40.0)

## 2024-08-10 LAB — PSA: PSA: 4.3 ng/mL — ABNORMAL HIGH (ref 0.10–4.00)

## 2024-08-10 LAB — VITAMIN D 25 HYDROXY (VIT D DEFICIENCY, FRACTURES): VITD: >120 ng/mL

## 2024-08-10 NOTE — Addendum Note (Signed)
 Addended by: KATHRYNE MILLMAN B on: 08/10/2024 02:47 PM   Modules accepted: Orders

## 2024-08-10 NOTE — Telephone Encounter (Signed)
 CRITICAL VALUE STICKER  CRITICAL VALUE: Vit D > 120  RECEIVER (on-site recipient of call): Vernell CMA  DATE & TIME NOTIFIED: 08/10/24 3:25 pm  MESSENGER (representative from lab): Kristine   MD NOTIFIED: Theophilus MD  TIME OF NOTIFICATION:  RESPONSE:

## 2024-08-10 NOTE — Telephone Encounter (Signed)
 Left message on machine for patient to return our call

## 2024-08-10 NOTE — Progress Notes (Signed)
 Established Patient Office Visit     CC/Reason for Visit: Annual preventive exam and follow-up chronic medical conditions  HPI: Arthur Garcia is a 73 y.o. male who is coming in today for the above mentioned reasons. Past Medical History is significant for: Hypertension, hyperlipidemia, impaired glucose tolerance, history of aortic atherosclerosis.  Feeling well, no concerns or complaints.  Requesting flu vaccine.  Due for colon, prostate and lung cancer screening.  Has routine eye and dental care.   Past Medical/Surgical History: Past Medical History:  Diagnosis Date   Abnormal liver function test    Anxiety    Colon polyp    DDD (degenerative disc disease)    ED (erectile dysfunction) 04/05/2014   FHx: heart disease 01/28/2018   GERD (gastroesophageal reflux disease)    OTC   Hyperlipidemia    Hypertension    Insomnia     Past Surgical History:  Procedure Laterality Date   ADENOIDECTOMY     age 40   ANKLE ARTHROSCOPY WITH RECONSTRUCTION Left 10/21/2023   Procedure: LEFT ANKLE ARTHROSCOPY WITH OPEN STABILIZATION OF LATERAL ANKLE LIGAMENT COMPLEX(COMPLEX);  Surgeon: Barton Drape, MD;  Location: [facility];  Service: Orthopedics;  Laterality: Left;   APPENDECTOMY     DENTAL SURGERY     Dental implants   LAPAROSCOPIC CHOLECYSTECTOMY  05/2020   At surgery center, Dr. Stevie   REPAIR OF PERONEUS BREVIS TENDON Left 10/21/2023   Procedure: debridement OF PERONEUS BREVIS TENDON;  Surgeon: Barton Drape, MD;  Location: [facility];  Service: Orthopedics;  Laterality: Left;   SPINE SURGERY  Aug 2016   Dr. Unice Lah     VASECTOMY      Social History:  reports that he quit smoking about 4 years ago. His smoking use included cigarettes. He started smoking about 49 years ago. He has a 45 pack-year smoking history. He has never used smokeless tobacco. He reports current alcohol use of about 20.0 standard drinks of alcohol per  week. He reports that he does not use drugs.  Allergies: No Known Allergies  Family History:  Family History  Problem Relation Age of Onset   Thyroid  disease Mother    Hypertension Mother    Osteoporosis Mother    Glaucoma Mother    Thyroid  disease Father    Hypertension Father    Hyperlipidemia Father    Heart disease Father        CHF   Heart disease Paternal Grandfather 85       Long standing heart trouble     Current Outpatient Medications:    atorvastatin  (LIPITOR) 20 MG tablet, TAKE 1 TABLET BY MOUTH EVERY DAY FOR CHOLESTEROL, Disp: 90 tablet, Rfl: 0   Cholecalciferol (VITAMIN D -3) 5000 UNITS TABS, Take by mouth daily. Alternates between 5000 and 89999, Disp: , Rfl:    losartan  (COZAAR ) 50 MG tablet, TAKE 1 TABLET DAILY FOR BLOOD PRESSURE, Disp: 90 tablet, Rfl: 3   tadalafil  (CIALIS ) 5 MG tablet, Take 1 tablet  Daily  for Prostate, Disp: 90 tablet, Rfl: 3   traZODone  (DESYREL ) 100 MG tablet, Take  1 tablet 1-2 hours  before Bedtime  as needed for Sleep, Disp: 90 tablet, Rfl: 3  Review of Systems:  Negative unless indicated in HPI.   Physical Exam: Vitals:   08/10/24 1108  BP: 130/80  Pulse: 75  Temp: 97.6 F (36.4 C)  TempSrc: Oral  SpO2: 98%  Weight: 200 lb 1.6 oz (90.8 kg)  Height: 6' 0.5 (1.842 m)    Body mass index is 26.77 kg/m.   Physical Exam Vitals reviewed.  Constitutional:      General: He is not in acute distress.    Appearance: Normal appearance. He is not ill-appearing, toxic-appearing or diaphoretic.  HENT:     Head: Normocephalic.     Right Ear: Tympanic membrane, ear canal and external ear normal. There is no impacted cerumen.     Left Ear: Tympanic membrane, ear canal and external ear normal. There is no impacted cerumen.     Nose: Nose normal.     Mouth/Throat:     Mouth: Mucous membranes are moist.     Pharynx: Oropharynx is clear. No oropharyngeal exudate or posterior oropharyngeal erythema.  Eyes:     General: No scleral  icterus.       Right eye: No discharge.        Left eye: No discharge.     Conjunctiva/sclera: Conjunctivae normal.     Pupils: Pupils are equal, round, and reactive to light.  Neck:     Vascular: No carotid bruit.  Cardiovascular:     Rate and Rhythm: Normal rate and regular rhythm.     Pulses: Normal pulses.     Heart sounds: Normal heart sounds.  Pulmonary:     Effort: Pulmonary effort is normal. No respiratory distress.     Breath sounds: Normal breath sounds.  Abdominal:     General: Abdomen is flat. Bowel sounds are normal.     Palpations: Abdomen is soft.  Musculoskeletal:        General: Normal range of motion.     Cervical back: Normal range of motion.  Skin:    General: Skin is warm and dry.  Neurological:     General: No focal deficit present.     Mental Status: He is alert and oriented to person, place, and time. Mental status is at baseline.  Psychiatric:        Mood and Affect: Mood normal.        Behavior: Behavior normal.        Thought Content: Thought content normal.        Judgment: Judgment normal.    Subsequent Medicare wellness visit   Visit info / Clinical Intake: Medicare Wellness Visit Type:: Subsequent Annual Wellness Visit Persons participating in visit:: patient Medicare Wellness Visit Mode:: In-person (required for WTM) Information given by:: patient Interpreter Needed?: No Pre-visit prep was completed: yes AWV questionnaire completed by patient prior to visit?: yes (08/09/24) Date:: 08/09/24 Living arrangements:: lives with spouse/significant other Patient's Overall Health Status Rating: good Typical amount of pain: none Does pain affect daily life?: no Are you currently prescribed opioids?: no  Dietary Habits and Nutritional Risks How many meals a day?: 3 Eats fruit and vegetables daily?: yes Most meals are obtained by: eating out; preparing own meals In the last 2 weeks, have you had any of the following?: none Diabetic::  no  Functional Status Activities of Daily Living (to include ambulation/medication): (Patient-Rptd) Independent Ambulation: (Patient-Rptd) Independent Home Management: (Patient-Rptd) Independent Manage your own finances?: yes Primary transportation is: driving Concerns about vision?: no *vision screening is required for WTM* Concerns about hearing?: no  Fall Screening Falls in the past year?: (Patient-Rptd) 0 Number of falls in past year: (Patient-Rptd) 0 Was there an injury with Fall?: (Patient-Rptd) 0 Fall Risk Category Calculator: (Patient-Rptd) 0 Patient Fall Risk Level: (Patient-Rptd) Low Fall Risk  Fall Risk Patient at Risk for  Falls Due to: No Fall Risks Fall risk Follow up: Falls evaluation completed  Home and Transportation Safety: All rugs have non-skid backing?: yes All stairs or steps have railings?: yes Grab bars in the bathtub or shower?: (!) no Have non-skid surface in bathtub or shower?: yes Good home lighting?: yes Regular seat belt use?: yes Hospital stays in the last year:: (!) yes How many hospital stays:: 2 Reason: back surgery , ankle surgery  Cognitive Assessment Difficulty concentrating, remembering, or making decisions? : no Will 6CIT or Mini Cog be Completed: yes What year is it?: 0 points What month is it?: 0 points Give patient an address phrase to remember (5 components): the cow jumped over the moon About what time is it?: 0 points Count backwards from 20 to 1: 0 points Say the months of the year in reverse: 0 points Repeat the address phrase from earlier: 0 points 6 CIT Score: 0 points  Advance Directives (For Healthcare) Does Patient Have a Medical Advance Directive?: Yes Does patient want to make changes to medical advance directive?: No - Patient declined Type of Advance Directive: Healthcare Power of [place]; Living will Copy of Healthcare Power of Attorney in Chart?: No - copy requested Copy of Living Will in Chart?: No - copy  requested Would patient like information on creating a medical advance directive?: No - Patient declined  Reviewed/Updated  Reviewed/Updated: Reviewed All (Medical, Surgical, Family, Medications, Allergies, Care Teams, Patient Goals); Allergies; Care Teams; Patient Goals    Vision Screening   Right eye Left eye Both eyes  Without correction 20/40 20/40 20/40   With correction     Comments: Patient did not wear contacts today     Depression/mood:        Counseling: Counseling given: Not Answered Tobacco comments: quit using chantix       Lab orders based on risk factors: Laboratory update will be reviewed     Screening: Patient provided with a written and personalized 5-10 year screening schedule in the AVS. Health Maintenance  Topic Date Due   Zoster (Shingles) Vaccine (1 of 2) 07/20/1969   Screening for Lung Cancer  03/08/2024   COVID-19 Vaccine (4 - 2025-26 season) 05/30/2024   Flu Shot  12/27/2024*   Cologuard (Stool DNA test)  09/07/2024   Medicare Annual Wellness Visit  08/10/2025   DTaP/Tdap/Td vaccine (3 - Tdap) 07/11/2027   Pneumococcal Vaccine for age over 23  Completed   Hepatitis C Screening  Completed   Meningitis B Vaccine  Aged Out  *Topic was postponed. The date shown is not the original due date.     Provider List Update: Patient Care Team    Relationship Specialty Notifications Start End  Theophilus Andrews, Tully GRADE, MD PCP - General Internal Medicine  01/21/24   Unice Pac, MD Consulting Physician Neurosurgery  01/09/15   Aneita Gwendlyn DASEN, MD (Inactive) Consulting Physician Gastroenterology  01/09/15   Robinson Idol, MD Consulting Physician Ophthalmology  01/09/15   Lavona Agent, MD Consulting Physician Cardiology  01/09/15   Ivin Kocher, MD Consulting Physician Dermatology  01/09/15        I have personally reviewed and noted the following in the patient's chart:   Medical and social history Use of alcohol, tobacco or illicit drugs   Current medications and supplements Functional ability and status Nutritional status Physical activity Advanced directives List of other physicians Hospitalizations, surgeries, and ER visits in previous 12 months Vitals Screenings to include cognitive, depression, and falls Referrals and appointments  In addition, I have reviewed and discussed with patient certain preventive protocols, quality metrics, and best practice recommendations. A written personalized care plan for preventive services as well as general preventive health recommendations were provided to patient.   Impression and Plan:  Medicare annual wellness visit, subsequent -     PSA; Future  Hyperlipidemia, mixed -     Lipid panel; Future  Essential hypertension -     CBC with Differential/Platelet; Future -     Comprehensive metabolic panel with GFR; Future  Other male erectile dysfunction  Vitamin D  deficiency -     VITAMIN D  25 Hydroxy (Vit-D Deficiency, Fractures); Future  CKD (chronic kidney disease) stage 2, GFR 60-89 ml/min  Cigarette nicotine dependence in remission -     CT CHEST LUNG CANCER SCREENING LOW DOSE WO CONTRAST; Future  Gastroesophageal reflux disease, unspecified whether esophagitis present  IGT (impaired glucose tolerance) -     Hemoglobin A1c; Future  Immunization due  Screening for colon cancer -     Cologuard   -Recommend routine eye and dental care. -Healthy lifestyle discussed in detail. -Labs to be updated today. -Prostate cancer screening: PSA today Health Maintenance  Topic Date Due   Zoster (Shingles) Vaccine (1 of 2) 07/20/1969   Screening for Lung Cancer  03/08/2024   COVID-19 Vaccine (4 - 2025-26 season) 05/30/2024   Flu Shot  12/27/2024*   Cologuard (Stool DNA test)  09/07/2024   Medicare Annual Wellness Visit  08/10/2025   DTaP/Tdap/Td vaccine (3 - Tdap) 07/11/2027   Pneumococcal Vaccine for age over 59  Completed   Hepatitis C Screening  Completed    Meningitis B Vaccine  Aged Out  *Topic was postponed. The date shown is not the original due date.    - Set up for low-dose chest CT scan for lung cancer screening given smoking history. - Advised to update shingles vaccine at pharmacy. - Flu vaccine administered in office today. - Blood pressure is fairly well-controlled on current. - Check vitamin D  and lipids today. Check A1c as well.    Tully Theophilus Andrews, MD [facility] at [facility]

## 2024-08-11 ENCOUNTER — Ambulatory Visit: Payer: Self-pay | Admitting: Internal Medicine

## 2024-08-11 DIAGNOSIS — E559 Vitamin D deficiency, unspecified: Secondary | ICD-10-CM

## 2024-08-11 DIAGNOSIS — R972 Elevated prostate specific antigen [PSA]: Secondary | ICD-10-CM

## 2024-08-11 NOTE — Telephone Encounter (Signed)
 Wife is aware of lab results per PDR.  See lab result note.

## 2024-08-12 ENCOUNTER — Ambulatory Visit

## 2024-08-23 ENCOUNTER — Other Ambulatory Visit: Payer: Self-pay | Admitting: *Deleted

## 2024-08-23 DIAGNOSIS — Z87891 Personal history of nicotine dependence: Secondary | ICD-10-CM

## 2024-08-23 DIAGNOSIS — Z122 Encounter for screening for malignant neoplasm of respiratory organs: Secondary | ICD-10-CM

## 2024-09-12 ENCOUNTER — Ambulatory Visit (HOSPITAL_BASED_OUTPATIENT_CLINIC_OR_DEPARTMENT_OTHER)

## 2024-09-15 ENCOUNTER — Ambulatory Visit (HOSPITAL_BASED_OUTPATIENT_CLINIC_OR_DEPARTMENT_OTHER): Admission: RE | Admit: 2024-09-15 | Source: Ambulatory Visit

## 2024-09-15 DIAGNOSIS — Z122 Encounter for screening for malignant neoplasm of respiratory organs: Secondary | ICD-10-CM | POA: Insufficient documentation

## 2024-09-15 DIAGNOSIS — Z87891 Personal history of nicotine dependence: Secondary | ICD-10-CM | POA: Diagnosis present

## 2024-09-26 ENCOUNTER — Telehealth: Payer: Self-pay

## 2024-09-26 DIAGNOSIS — R911 Solitary pulmonary nodule: Secondary | ICD-10-CM

## 2024-09-26 DIAGNOSIS — Z87891 Personal history of nicotine dependence: Secondary | ICD-10-CM

## 2024-09-26 NOTE — Telephone Encounter (Signed)
 Call Report TIffany  IMPRESSION: 1. New 5.7 mm right lower lobe nodule. Lung-RADS 3, probably benign findings. Short-term follow-up in 6 months is recommended with repeat low-dose chest CT without contrast (please use the following order, CT CHEST LCS NODULE FOLLOW-UP W/O CM). These results will be called to the ordering clinician or representative by the Radiologist Assistant, and communication documented in the PACS or [employer]. 2. Asymmetric left thyroid  enlargement, heterogeneity and 1.6 cm low-attenuation nodule. Recommend thyroid  ultrasound (ref: J Am Coll Radiol. 2015 Feb;12(2): 143-50). 3. Liver margin may be minimally irregular, raising suspicion for cirrhosis. 4. Aortic atherosclerosis (ICD10-I70.0). Coronary artery calcification. 5.  Emphysema (ICD10-J43.9).

## 2024-09-26 NOTE — Addendum Note (Signed)
 Addended by: ANITRA AQUAS D on: 09/26/2024 10:46 AM   Modules accepted: Orders

## 2024-09-26 NOTE — Telephone Encounter (Signed)
 Spoke with pt and reviewed lung screening CT results. New lung nodule noted that we will look at again with a 6 month nodule CT.  Thyroid  nodule noted. Thyroid  ultrasound recommended. PT advised that PCP as been notified and will contact him to schedule ultrasound. PT verbalized understanding. Order placed for 6 month nodule CT.

## 2024-09-26 NOTE — Telephone Encounter (Signed)
 Returned call from [employer] but had to leave another message for pt to return call.

## 2024-10-01 LAB — COLOGUARD: COLOGUARD: NEGATIVE

## 2024-10-04 ENCOUNTER — Ambulatory Visit (INDEPENDENT_AMBULATORY_CARE_PROVIDER_SITE_OTHER): Admitting: Internal Medicine

## 2024-10-04 ENCOUNTER — Encounter: Payer: Self-pay | Admitting: Internal Medicine

## 2024-10-04 VITALS — BP 120/70 | HR 69 | Temp 97.5°F | Wt 202.6 lb

## 2024-10-04 DIAGNOSIS — E041 Nontoxic single thyroid nodule: Secondary | ICD-10-CM | POA: Diagnosis not present

## 2024-10-04 DIAGNOSIS — R911 Solitary pulmonary nodule: Secondary | ICD-10-CM

## 2024-10-04 DIAGNOSIS — J0110 Acute frontal sinusitis, unspecified: Secondary | ICD-10-CM

## 2024-10-04 MED ORDER — AZITHROMYCIN 250 MG PO TABS: ORAL_TABLET | ORAL | 0 refills | Status: AC

## 2024-10-04 NOTE — Progress Notes (Signed)
 "    Established Patient Office Visit     CC/Reason for Visit: Acute sinus issues, discuss results of CT scan  HPI: Arthur Garcia is a 74 y.o. male who is coming in today for the above mentioned reasons.  Has been having flulike symptoms for the past 3 weeks.  Copious amounts of yellow drainage, right sided frontal headache.  Would also like to review results of low-dose CT scan with me.  He was found to have a new thyroid  nodule that needs workup as well as a new pulmonary nodule for which a repeat CT in 6 months has been recommended.   Past Medical/Surgical History: Past Medical History:  Diagnosis Date   Abnormal liver function test    Anxiety    Colon polyp    DDD (degenerative disc disease)    ED (erectile dysfunction) 04/05/2014   FHx: heart disease 01/28/2018   GERD (gastroesophageal reflux disease)    OTC   Hyperlipidemia    Hypertension    Insomnia     Past Surgical History:  Procedure Laterality Date   ADENOIDECTOMY     age 25   ANKLE ARTHROSCOPY WITH RECONSTRUCTION Left 10/21/2023   Procedure: LEFT ANKLE ARTHROSCOPY WITH OPEN STABILIZATION OF LATERAL ANKLE LIGAMENT COMPLEX(COMPLEX);  Surgeon: Barton Drape, MD;  Location: [facility];  Service: Orthopedics;  Laterality: Left;   APPENDECTOMY     DENTAL SURGERY     Dental implants   LAPAROSCOPIC CHOLECYSTECTOMY  05/2020   At surgery center, Dr. Stevie   REPAIR OF PERONEUS BREVIS TENDON Left 10/21/2023   Procedure: debridement OF PERONEUS BREVIS TENDON;  Surgeon: Barton Drape, MD;  Location: [facility];  Service: Orthopedics;  Laterality: Left;   SPINE SURGERY  Aug 2016   Dr. Unice Lah     VASECTOMY      Social History:  reports that he quit smoking about 4 years ago. His smoking use included cigarettes. He started smoking about 49 years ago. He has a 45 pack-year smoking history. He has never used smokeless tobacco. He reports current alcohol use of about 20.0  standard drinks of alcohol per week. He reports that he does not use drugs.  Allergies: Allergies[1]  Family History:  Family History  Problem Relation Age of Onset   Thyroid  disease Mother    Hypertension Mother    Osteoporosis Mother    Glaucoma Mother    Thyroid  disease Father    Hypertension Father    Hyperlipidemia Father    Heart disease Father        CHF   Heart disease Paternal Grandfather 42       Long standing heart trouble    Current Medications[2]  Review of Systems:  Negative unless indicated in HPI.   Physical Exam: Vitals:   10/04/24 1334  BP: 120/70  Pulse: 69  Temp: (!) 97.5 F (36.4 C)  TempSrc: Oral  SpO2: 98%  Weight: 202 lb 9.6 oz (91.9 kg)    Body mass index is 27.1 kg/m.    Impression and Plan:  Acute non-recurrent frontal sinusitis -     Azithromycin ; Take 2 tablets on day 1, then 1 tablet daily on days 2 through 5  Dispense: 6 tablet; Refill: 0  Thyroid  nodule -     US  THYROID ; Future  Pulmonary nodule   - Z-Pak for acute frontal sinusitis. - Order thyroid  ultrasound to follow-up on thyroid  nodule seen on CT chest. - Follow-up CT chest in  6 months has already been ordered by the lung cancer screening program.  Time spent:30 minutes reviewing chart, interviewing and examining patient and formulating plan of care.     Tully Theophilus Andrews, MD [facility]     [1] No Known Allergies [2]  Current Outpatient Medications:    atorvastatin  (LIPITOR) 20 MG tablet, TAKE 1 TABLET BY MOUTH EVERY DAY FOR CHOLESTEROL, Disp: 90 tablet, Rfl: 0   azithromycin  (ZITHROMAX ) 250 MG tablet, Take 2 tablets on day 1, then 1 tablet daily on days 2 through 5, Disp: 6 tablet, Rfl: 0   Cholecalciferol (VITAMIN D -3) 5000 UNITS TABS, Take by mouth daily. Alternates between 5000 and 89999, Disp: , Rfl:    losartan  (COZAAR ) 50 MG tablet, TAKE 1 TABLET DAILY FOR BLOOD PRESSURE, Disp: 90 tablet, Rfl: 3   tadalafil  (CIALIS )  5 MG tablet, Take 1 tablet  Daily  for Prostate, Disp: 90 tablet, Rfl: 3   traZODone  (DESYREL ) 100 MG tablet, Take  1 tablet 1-2 hours  before Bedtime  as needed for Sleep, Disp: 90 tablet, Rfl: 3  "

## 2024-10-17 ENCOUNTER — Ambulatory Visit (HOSPITAL_BASED_OUTPATIENT_CLINIC_OR_DEPARTMENT_OTHER)
Admission: RE | Admit: 2024-10-17 | Discharge: 2024-10-17 | Disposition: A | Discharge status: Home / Self Care | Source: Ambulatory Visit | Attending: Internal Medicine | Admitting: Internal Medicine

## 2024-10-17 DIAGNOSIS — E041 Nontoxic single thyroid nodule: Secondary | ICD-10-CM | POA: Diagnosis present

## 2024-10-20 ENCOUNTER — Ambulatory Visit: Payer: Self-pay | Admitting: Internal Medicine

## 2024-10-20 DIAGNOSIS — E041 Nontoxic single thyroid nodule: Secondary | ICD-10-CM

## 2024-10-22 ENCOUNTER — Other Ambulatory Visit: Payer: Self-pay | Admitting: Internal Medicine

## 2024-10-22 DIAGNOSIS — E782 Mixed hyperlipidemia: Secondary | ICD-10-CM

## 2024-10-28 ENCOUNTER — Ambulatory Visit
Admission: RE | Admit: 2024-10-28 | Discharge: 2024-10-28 | Disposition: A | Source: Ambulatory Visit | Attending: Internal Medicine | Admitting: Internal Medicine

## 2024-10-28 ENCOUNTER — Other Ambulatory Visit (HOSPITAL_COMMUNITY)
Admission: RE | Admit: 2024-10-28 | Discharge: 2024-10-28 | Disposition: A | Source: Ambulatory Visit | Attending: Radiology | Admitting: Radiology

## 2024-10-28 DIAGNOSIS — E041 Nontoxic single thyroid nodule: Secondary | ICD-10-CM | POA: Diagnosis present

## 2024-10-31 LAB — CYTOLOGY - NON PAP

## 2024-11-01 ENCOUNTER — Ambulatory Visit: Payer: Self-pay | Admitting: Internal Medicine
